# Patient Record
Sex: Male | Born: 1950 | Race: White | Hispanic: No | Marital: Married | Smoking: Never smoker
Health system: Southern US, Community
[De-identification: ages and names within clinical notes are randomized; demographics above are authoritative.]

## PROBLEM LIST (undated history)

## (undated) DIAGNOSIS — F419 Anxiety disorder, unspecified: Secondary | ICD-10-CM

## (undated) DIAGNOSIS — H269 Unspecified cataract: Secondary | ICD-10-CM

## (undated) DIAGNOSIS — R350 Frequency of micturition: Secondary | ICD-10-CM

## (undated) DIAGNOSIS — I10 Essential (primary) hypertension: Secondary | ICD-10-CM

## (undated) DIAGNOSIS — M1990 Unspecified osteoarthritis, unspecified site: Secondary | ICD-10-CM

## (undated) DIAGNOSIS — N400 Enlarged prostate without lower urinary tract symptoms: Secondary | ICD-10-CM

## (undated) DIAGNOSIS — C801 Malignant (primary) neoplasm, unspecified: Secondary | ICD-10-CM

## (undated) DIAGNOSIS — E785 Hyperlipidemia, unspecified: Secondary | ICD-10-CM

## (undated) DIAGNOSIS — F32A Depression, unspecified: Secondary | ICD-10-CM

## (undated) DIAGNOSIS — G4730 Sleep apnea, unspecified: Secondary | ICD-10-CM

## (undated) DIAGNOSIS — R3915 Urgency of urination: Secondary | ICD-10-CM

## (undated) DIAGNOSIS — K219 Gastro-esophageal reflux disease without esophagitis: Secondary | ICD-10-CM

## (undated) DIAGNOSIS — M2550 Pain in unspecified joint: Secondary | ICD-10-CM

## (undated) DIAGNOSIS — F410 Panic disorder [episodic paroxysmal anxiety] without agoraphobia: Secondary | ICD-10-CM

## (undated) DIAGNOSIS — F332 Major depressive disorder, recurrent severe without psychotic features: Secondary | ICD-10-CM

## (undated) DIAGNOSIS — R51 Headache: Secondary | ICD-10-CM

## (undated) DIAGNOSIS — F329 Major depressive disorder, single episode, unspecified: Secondary | ICD-10-CM

## (undated) HISTORY — PX: OTHER SURGICAL HISTORY: SHX169

## (undated) HISTORY — PX: CATARACT EXTRACTION: SUR2

## (undated) HISTORY — PX: EYE SURGERY: SHX253

---

## 2000-03-18 ENCOUNTER — Encounter (INDEPENDENT_AMBULATORY_CARE_PROVIDER_SITE_OTHER): Payer: Self-pay | Admitting: *Deleted

## 2000-03-18 ENCOUNTER — Ambulatory Visit (HOSPITAL_BASED_OUTPATIENT_CLINIC_OR_DEPARTMENT_OTHER): Admission: RE | Admit: 2000-03-18 | Discharge: 2000-03-18 | Payer: Self-pay | Admitting: General Surgery

## 2003-04-13 ENCOUNTER — Emergency Department (HOSPITAL_COMMUNITY): Admission: EM | Admit: 2003-04-13 | Discharge: 2003-04-13 | Payer: Self-pay | Admitting: Emergency Medicine

## 2008-07-03 ENCOUNTER — Encounter: Admission: RE | Admit: 2008-07-03 | Discharge: 2008-07-03 | Payer: Self-pay | Admitting: General Surgery

## 2011-05-08 NOTE — Op Note (Signed)
[place]  Patient:    Brad Miller, Brad Miller                         MRN: [id number] Proc. Date: 03/18/00 Attending:  Gita Kudo, M.D. CC:         Oley Balm. Georgina Pillion, M.D.             [facility]                           Operative Report  OPERATIVE PROCEDURE:  Excision of right breast mass.  SURGEON:  Gita Kudo, M.D.  ANESTHESIA:  MAC -- IV sedation, 1% Xylocaine.  PREOPERATIVE DIAGNOSIS:  Right breast mass, probable gynecomastia.  POSTOPERATIVE DIAGNOSIS:  Right breast mass, probable gynecomastia, pending pathology.  CLINICAL SUMMARY:  [age]-year-old man with a tender nodule in his right breast f about two months duration.  I elected to observe it two to three weeks ago after seeing him in the office.  He came back complaining that it was very tender and he would like to have it removed.  It does not feel malignant.  OPERATIVE FINDINGS:  The patient had rubbery-firm breast tissue without a definite hard mass or specific nodule.  DESCRIPTION OF PROCEDURE:  Under satisfactory intravenous sedation, the patients right chest and breast were prepped and draped in a standard fashion; a curved incision made from the 10 through 1 oclock position of the right breast at the areola, small flaps developed and then the rubbery-firm tissue was removed with a rim of normal breast tissue; this was done using coagulation current for both dissection and hemostasis.  This was carried all the way down to the pectoral muscle.  Then, after the specimen was removed, the wound was made dry by cautery, lavaged with saline and suctioned dry, the breast tissue approximated with 3-0 Vicryl, skin edges with interrupted and running 4-0 nylon, sterile absorbent compressive dressing applied and the patient went to the recovery room from the  operating room in good condition. DD:  03/18/00 TD:  03/18/00 Job: [id number]

## 2014-02-06 ENCOUNTER — Other Ambulatory Visit: Payer: Self-pay | Admitting: Orthopedic Surgery

## 2014-02-08 ENCOUNTER — Encounter (HOSPITAL_COMMUNITY): Payer: Self-pay | Admitting: Pharmacy Technician

## 2014-02-13 ENCOUNTER — Other Ambulatory Visit: Payer: Self-pay | Admitting: Orthopedic Surgery

## 2014-02-15 ENCOUNTER — Inpatient Hospital Stay (HOSPITAL_COMMUNITY)
Admission: RE | Admit: 2014-02-15 | Discharge: 2014-02-15 | Disposition: A | Discharge status: Home / Self Care | Payer: Self-pay | Source: Ambulatory Visit

## 2014-02-15 NOTE — Pre-Procedure Instructions (Signed)
Hiro Vipond  02/15/2014   Your procedure is scheduled on:  Friday, March 6th   Report to [facility]  2 * 3 at 10:45 AM.  Call this number if you have problems the morning of surgery: [phone]   Remember:   Do not eat food or drink liquids after midnight Thursday.   Take these medicines the morning of surgery with A SIP OF WATER: Xanax, Wellbutrin   Do not wear jewelry-no rings or watches.  Do not wear lotions or colognes . You may NOT wear deodorant.   Men may shave face and neck.   Do not bring valuables to the hospital.  [facility] is not responsible for any belongings or valuables.               Contacts, dentures or bridgework may not be worn into surgery.  Leave suitcase in the car. After surgery it may be brought to your room.  For patients admitted to the hospital, discharge time is determined by your treatment team.            Name and phone number of your driver:    Special Instructions:  "Preparing for Surgery"   Please read over the following fact sheets that you were given: Pain Booklet, Coughing and Deep Breathing, Blood Transfusion Information, MRSA Information and Surgical Site Infection Prevention

## 2014-02-20 ENCOUNTER — Encounter (HOSPITAL_COMMUNITY)
Admission: RE | Admit: 2014-02-20 | Discharge: 2014-02-20 | Disposition: A | Discharge status: Home / Self Care | Payer: BC Managed Care – PPO | Source: Ambulatory Visit | Attending: Orthopedic Surgery | Admitting: Orthopedic Surgery

## 2014-02-20 ENCOUNTER — Encounter (HOSPITAL_COMMUNITY): Payer: Self-pay

## 2014-02-20 ENCOUNTER — Ambulatory Visit (HOSPITAL_COMMUNITY)
Admission: RE | Admit: 2014-02-20 | Discharge: 2014-02-20 | Disposition: A | Discharge status: Home / Self Care | Payer: BC Managed Care – PPO | Source: Ambulatory Visit | Attending: Orthopedic Surgery | Admitting: Orthopedic Surgery

## 2014-02-20 DIAGNOSIS — Z01810 Encounter for preprocedural cardiovascular examination: Secondary | ICD-10-CM | POA: Insufficient documentation

## 2014-02-20 DIAGNOSIS — Z01818 Encounter for other preprocedural examination: Secondary | ICD-10-CM | POA: Insufficient documentation

## 2014-02-20 HISTORY — DX: Frequency of micturition: R35.0

## 2014-02-20 HISTORY — DX: Depression, unspecified: F32.A

## 2014-02-20 HISTORY — DX: Headache: R51

## 2014-02-20 HISTORY — DX: Hyperlipidemia, unspecified: E78.5

## 2014-02-20 HISTORY — DX: Unspecified osteoarthritis, unspecified site: M19.90

## 2014-02-20 HISTORY — DX: Major depressive disorder, single episode, unspecified: F32.9

## 2014-02-20 HISTORY — DX: Benign prostatic hyperplasia without lower urinary tract symptoms: N40.0

## 2014-02-20 HISTORY — DX: Pain in unspecified joint: M25.50

## 2014-02-20 HISTORY — DX: Anxiety disorder, unspecified: F41.9

## 2014-02-20 HISTORY — DX: Unspecified cataract: H26.9

## 2014-02-20 HISTORY — DX: Urgency of urination: R39.15

## 2014-02-20 HISTORY — DX: Essential (primary) hypertension: I10

## 2014-02-20 HISTORY — DX: Sleep apnea, unspecified: G47.30

## 2014-02-20 LAB — TYPE AND SCREEN
ABO/RH(D): O NEG
Antibody Screen: NEGATIVE

## 2014-02-20 LAB — PROTIME-INR
INR: 0.99 (ref 0.00–1.49)
PROTHROMBIN TIME: 12.9 s (ref 11.6–15.2)

## 2014-02-20 LAB — CBC WITH DIFFERENTIAL/PLATELET
BASOS PCT: 0 % (ref 0–1)
Basophils Absolute: 0 10*3/uL (ref 0.0–0.1)
EOS ABS: 0.3 10*3/uL (ref 0.0–0.7)
EOS PCT: 4 % (ref 0–5)
HCT: 46.7 % (ref 39.0–52.0)
Hemoglobin: 16.1 g/dL (ref 13.0–17.0)
LYMPHS ABS: 2.3 10*3/uL (ref 0.7–4.0)
Lymphocytes Relative: 28 % (ref 12–46)
MCH: 31 pg (ref 26.0–34.0)
MCHC: 34.5 g/dL (ref 30.0–36.0)
MCV: 89.8 fL (ref 78.0–100.0)
MONOS PCT: 13 % — AB (ref 3–12)
Monocytes Absolute: 1.1 10*3/uL — ABNORMAL HIGH (ref 0.1–1.0)
Neutro Abs: 4.5 10*3/uL (ref 1.7–7.7)
Neutrophils Relative %: 55 % (ref 43–77)
PLATELETS: 280 10*3/uL (ref 150–400)
RBC: 5.2 MIL/uL (ref 4.22–5.81)
RDW: 13.2 % (ref 11.5–15.5)
WBC: 8.1 10*3/uL (ref 4.0–10.5)

## 2014-02-20 LAB — ABO/RH: ABO/RH(D): O NEG

## 2014-02-20 LAB — URINALYSIS, ROUTINE W REFLEX MICROSCOPIC
Bilirubin Urine: NEGATIVE
GLUCOSE, UA: NEGATIVE mg/dL
Hgb urine dipstick: NEGATIVE
KETONES UR: NEGATIVE mg/dL
Leukocytes, UA: NEGATIVE
Nitrite: NEGATIVE
PH: 7 (ref 5.0–8.0)
Protein, ur: NEGATIVE mg/dL
SPECIFIC GRAVITY, URINE: 1.015 (ref 1.005–1.030)
Urobilinogen, UA: 1 mg/dL (ref 0.0–1.0)

## 2014-02-20 LAB — BASIC METABOLIC PANEL
BUN: 13 mg/dL (ref 6–23)
CO2: 27 meq/L (ref 19–32)
CREATININE: 0.91 mg/dL (ref 0.50–1.35)
Calcium: 9.3 mg/dL (ref 8.4–10.5)
Chloride: 102 mEq/L (ref 96–112)
GFR calc Af Amer: >90 mL/min (ref >90)
GFR calc non Af Amer: 88 mL/min — ABNORMAL LOW (ref >90)
GLUCOSE: 87 mg/dL (ref 70–99)
Potassium: 4.1 mEq/L (ref 3.7–5.3)
Sodium: 140 mEq/L (ref 137–147)

## 2014-02-20 LAB — SURGICAL PCR SCREEN
MRSA, PCR: NEGATIVE
Staphylococcus aureus: NEGATIVE

## 2014-02-20 LAB — APTT: aPTT: 31 seconds (ref 24–37)

## 2014-02-20 MED ORDER — CHLORHEXIDINE GLUCONATE 4 % EX LIQD: 60.0000 mL | Freq: Once | CUTANEOUS | Status: DC

## 2014-02-20 NOTE — Progress Notes (Signed)
PT DOESN'T HAVE A CARDIOLOGIST  SLEEP STUDY DONE 4-48YRS AGO-USES CPAP-([facility]ACE ON [address])  DENIES EVER HAVING AN ECHO/STRESS TEST/HEART CATH  JENNIFER WILLARD PA WITH [facility]  DENIES EKG OR CXR IN PAST YR

## 2014-02-20 NOTE — Pre-Procedure Instructions (Signed)
Chrishon Martino  02/20/2014   Your procedure is scheduled on:  Fri, Mar 6 @ 10:30 AM  Report to [facility]ance [facility]  at 8:30 AM.  Call this number if you have problems the morning of surgery: [phone]   Remember:   Do not eat food or drink liquids after midnight.   Take these medicines the morning of surgery with A SIP OF WATER: Alprazolam(Xanax),Wellbutrin(Bupropion),Cardura(Doxazosin),and Paxil(Paroxetine)\              Stop taking your Ibuprofen. No Goody's,BC's,Aleve,Fish Oil,or any Herbal Medications     Do not wear jewelry  Do not wear lotions, powders, or colognes. You may wear deodorant.  Men may shave face and neck.  Do not bring valuables to the hospital.  [facility] is not responsible                  for any belongings or valuables.               Contacts, dentures or bridgework may not be worn into surgery.  Leave suitcase in the car. After surgery it may be brought to your room.  For patients admitted to the hospital, discharge time is determined by your                treatment team.              Special Instructions:  [place] - Preparing for Surgery  Before surgery, you can play an important role.  Because skin is not sterile, your skin needs to be as free of germs as possible.  You can reduce the number of germs on you skin by washing with CHG (chlorahexidine gluconate) soap before surgery.  CHG is an antiseptic cleaner which kills germs and bonds with the skin to continue killing germs even after washing.  Please DO NOT use if you have an allergy to CHG or antibacterial soaps.  If your skin becomes reddened/irritated stop using the CHG and inform your nurse when you arrive at Short Stay.  Do not shave (including legs and underarms) for at least 48 hours prior to the first CHG shower.  You may shave your face.  Please follow these instructions carefully:   1.  Shower with CHG Soap the night before surgery and the                                 morning of Surgery.  2.  If you choose to wash your hair, wash your hair first as usual with your       normal shampoo.  3.  After you shampoo, rinse your hair and body thoroughly to remove the                      Shampoo.  4.  Use CHG as you would any other liquid soap.  You can apply chg directly       to the skin and wash gently with scrungie or a clean washcloth.  5.  Apply the CHG Soap to your body ONLY FROM THE NECK DOWN.        Do not use on open wounds or open sores.  Avoid contact with your eyes,       ears, mouth and genitals (private parts).  Wash genitals (private parts)       with your normal soap.  6.  Wash  thoroughly, paying special attention to the area where your surgery        will be performed.  7.  Thoroughly rinse your body with warm water from the neck down.  8.  DO NOT shower/wash with your normal soap after using and rinsing off       the CHG Soap.  9.  Pat yourself dry with a clean towel.            10.  Wear clean pajamas.            11.  Place clean sheets on your bed the night of your first shower and do not        sleep with pets.  Day of Surgery  Do not apply any lotions/deoderants the morning of surgery.  Please wear clean clothes to the hospital/surgery center.     Please read over the following fact sheets that you were given: Pain Booklet, Coughing and Deep Breathing, Blood Transfusion Information, MRSA Information and Surgical Site Infection Prevention

## 2014-02-22 MED ORDER — DEXTROSE 5 % IV SOLN
3.0000 g | INTRAVENOUS | Status: AC
  Administered 2014-02-23: 3 g via INTRAVENOUS
  Filled 2014-02-22: qty 3000

## 2014-02-22 NOTE — H&P (Signed)
TOTAL KNEE ADMISSION H&P  Patient is being admitted for right total knee arthroplasty.  Subjective:  Chief Complaint:right knee pain.  HPI: Brad Miller, 63 y.o. male, has a history of pain and functional disability in the right knee due to arthritis and has failed non-surgical conservative treatments for greater than 12 weeks to includeNSAID's and/or analgesics, flexibility and strengthening excercises, use of assistive devices and activity modification.  Onset of symptoms was gradual, starting 10 years ago with gradually worsening course since that time. The patient noted no past surgery on the right knee(s).  Patient currently rates pain in the right knee(s) at 10 out of 10 with activity. Patient has worsening of pain with activity and weight bearing, pain that interferes with activities of daily living and pain with passive range of motion.  Patient has evidence of joint space narrowing by imaging studies. There is no active infection.  There are no active problems to display for this patient.  Past Medical History  Diagnosis Date  . Hyperlipidemia     TAKES LOVASTATIN NIGHTLY  . Depression     TAKES WELLBUTRIN DAILY  . Anxiety     TAKES XANAX DAILY AS NEEDED  . Hypertension     TAKES CARDURA DAILY  . Sleep apnea     USES CPAP  . Headache(784.0)     OCCASIONALLY  . Arthritis   . Joint pain   . Urinary frequency   . Urinary urgency   . Enlarged prostate   . Diabetes mellitus without complication     BORDERLINE  . Cataracts, bilateral   . Rash     AROUND NOSE AND STATES D/T NOT USING LOTION WHILE SHAVING    Past Surgical History  Procedure Laterality Date  . Benign tumor removed from chest      No prescriptions prior to admission   No Known Allergies  History  Substance Use Topics  . Smoking status: Never Smoker   . Smokeless tobacco: Not on file  . Alcohol Use: Yes     Comment: OCCASIONALLY    No family history on file.   Review of Systems  Constitutional:  Negative.   HENT: Negative.   Eyes: Negative.   Respiratory: Negative.   Cardiovascular: Negative.   Gastrointestinal: Negative.   Musculoskeletal: Positive for joint pain.  Skin: Negative.   Neurological: Negative.   Psychiatric/Behavioral: Negative.     Objective:  Physical Exam  Constitutional: He is oriented to person, place, and time. He appears well-developed and well-nourished.  HENT:  Head: Normocephalic and atraumatic.  Eyes: Pupils are equal, round, and reactive to light.  Neck: Normal range of motion. Neck supple.  Cardiovascular: Normal rate and regular rhythm.   Respiratory: Effort normal.  Musculoskeletal: He exhibits tenderness.  Neurological: He is alert and oriented to person, place, and time.  Skin: Skin is warm and dry.  Psychiatric: He has a normal mood and affect. His behavior is normal. Judgment and thought content normal.    Vital signs in last 24 hours:    Labs:   There is no height or weight on file to calculate BMI.   Imaging Review Plain x-rays showed bone-on-bone arthritis to the medial aspect of the right knee and good maintenance of cartilage height on the left knee.  Assessment/Plan:  1.  End-stage arthritis of right knee pending replacement March 6 2.  Probable degenerative tearing of the medial and possibly lateral meniscus with chondromalacia of the left knee  We will go ahead and plan  on scoping of the left knee at the same time that were replacing the right knee on March 6.  The patient history, physical examination, clinical judgment of the provider and imaging studies are consistent with end stage degenerative joint disease of the right knee(s) and total knee arthroplasty is deemed medically necessary. The treatment options including medical management, injection therapy arthroscopy and arthroplasty were discussed at length. The risks and benefits of total knee arthroplasty were presented and reviewed. The risks due to aseptic  loosening, infection, stiffness, patella tracking problems, thromboembolic complications and other imponderables were discussed. The patient acknowledged the explanation, agreed to proceed with the plan and consent was signed. Patient is being admitted for inpatient treatment for surgery, pain control, PT, OT, prophylactic antibiotics, VTE prophylaxis, progressive ambulation and ADL's and discharge planning. The patient is planning to be discharged to skilled nursing facility

## 2014-02-23 ENCOUNTER — Encounter (HOSPITAL_COMMUNITY)
Admission: RE | Disposition: A | Discharge status: Home Health | Payer: Self-pay | Source: Ambulatory Visit | Attending: Orthopedic Surgery

## 2014-02-23 ENCOUNTER — Inpatient Hospital Stay (HOSPITAL_COMMUNITY): Payer: BC Managed Care – PPO | Admitting: Certified Registered"

## 2014-02-23 ENCOUNTER — Inpatient Hospital Stay (HOSPITAL_COMMUNITY)
Admission: RE | Admit: 2014-02-23 | Discharge: 2014-02-28 | DRG: 470 | Disposition: A | Discharge status: Home Health | Payer: BC Managed Care – PPO | Source: Ambulatory Visit | Attending: Orthopedic Surgery | Admitting: Orthopedic Surgery

## 2014-02-23 ENCOUNTER — Encounter (HOSPITAL_COMMUNITY): Payer: BC Managed Care – PPO | Admitting: Certified Registered"

## 2014-02-23 ENCOUNTER — Encounter (HOSPITAL_COMMUNITY): Payer: Self-pay | Admitting: *Deleted

## 2014-02-23 DIAGNOSIS — F411 Generalized anxiety disorder: Secondary | ICD-10-CM | POA: Diagnosis present

## 2014-02-23 DIAGNOSIS — F329 Major depressive disorder, single episode, unspecified: Secondary | ICD-10-CM | POA: Diagnosis present

## 2014-02-23 DIAGNOSIS — M1710 Unilateral primary osteoarthritis, unspecified knee: Principal | ICD-10-CM | POA: Diagnosis present

## 2014-02-23 DIAGNOSIS — I10 Essential (primary) hypertension: Secondary | ICD-10-CM | POA: Diagnosis present

## 2014-02-23 DIAGNOSIS — E785 Hyperlipidemia, unspecified: Secondary | ICD-10-CM | POA: Diagnosis present

## 2014-02-23 DIAGNOSIS — Z23 Encounter for immunization: Secondary | ICD-10-CM

## 2014-02-23 DIAGNOSIS — M23329 Other meniscus derangements, posterior horn of medial meniscus, unspecified knee: Secondary | ICD-10-CM | POA: Diagnosis present

## 2014-02-23 DIAGNOSIS — F3289 Other specified depressive episodes: Secondary | ICD-10-CM | POA: Diagnosis present

## 2014-02-23 DIAGNOSIS — M1711 Unilateral primary osteoarthritis, right knee: Secondary | ICD-10-CM | POA: Diagnosis present

## 2014-02-23 DIAGNOSIS — G4730 Sleep apnea, unspecified: Secondary | ICD-10-CM | POA: Diagnosis present

## 2014-02-23 DIAGNOSIS — D62 Acute posthemorrhagic anemia: Secondary | ICD-10-CM | POA: Diagnosis not present

## 2014-02-23 DIAGNOSIS — E119 Type 2 diabetes mellitus without complications: Secondary | ICD-10-CM | POA: Diagnosis present

## 2014-02-23 DIAGNOSIS — M2240 Chondromalacia patellae, unspecified knee: Secondary | ICD-10-CM | POA: Diagnosis present

## 2014-02-23 DIAGNOSIS — D72829 Elevated white blood cell count, unspecified: Secondary | ICD-10-CM | POA: Diagnosis present

## 2014-02-23 DIAGNOSIS — M2340 Loose body in knee, unspecified knee: Secondary | ICD-10-CM | POA: Diagnosis present

## 2014-02-23 HISTORY — PX: KNEE ARTHROSCOPY: SHX127

## 2014-02-23 HISTORY — PX: TOTAL KNEE ARTHROPLASTY: SHX125

## 2014-02-23 LAB — GLUCOSE, CAPILLARY
GLUCOSE-CAPILLARY: 95 mg/dL (ref 70–99)
GLUCOSE-CAPILLARY: 124 mg/dL — AB (ref 70–99)

## 2014-02-23 SURGERY — ARTHROPLASTY, KNEE, TOTAL
Anesthesia: General | Site: Knee | Laterality: Right

## 2014-02-23 MED ORDER — PROPOFOL 10 MG/ML IV BOLUS
INTRAVENOUS | Status: AC
  Filled 2014-02-23: qty 20

## 2014-02-23 MED ORDER — ACETAMINOPHEN 325 MG PO TABS
650.0000 mg | ORAL_TABLET | Freq: Four times a day (QID) | ORAL | Status: DC | PRN
  Administered 2014-02-24 – 2014-02-28 (×8): 650 mg via ORAL
  Filled 2014-02-23 (×8): qty 2

## 2014-02-23 MED ORDER — ONDANSETRON HCL 4 MG PO TABS: 4.0000 mg | ORAL_TABLET | Freq: Four times a day (QID) | ORAL | Status: DC | PRN

## 2014-02-23 MED ORDER — OXYCODONE HCL 5 MG PO TABS
5.0000 mg | ORAL_TABLET | ORAL | Status: DC | PRN
  Administered 2014-02-23 – 2014-02-25 (×11): 10 mg via ORAL
  Filled 2014-02-23 (×11): qty 2

## 2014-02-23 MED ORDER — GLYCOPYRROLATE 0.2 MG/ML IJ SOLN
INTRAMUSCULAR | Status: DC | PRN
  Administered 2014-02-23: 0.4 mg via INTRAVENOUS

## 2014-02-23 MED ORDER — ALPRAZOLAM 0.5 MG PO TABS
0.5000 mg | ORAL_TABLET | Freq: Three times a day (TID) | ORAL | Status: DC | PRN
  Administered 2014-02-24 – 2014-02-27 (×6): 0.5 mg via ORAL
  Filled 2014-02-23 (×6): qty 1

## 2014-02-23 MED ORDER — CEFUROXIME SODIUM 1.5 G IJ SOLR
INTRAMUSCULAR | Status: AC
  Filled 2014-02-23: qty 1.5

## 2014-02-23 MED ORDER — SIMVASTATIN 10 MG PO TABS
10.0000 mg | ORAL_TABLET | Freq: Every day | ORAL | Status: DC
  Administered 2014-02-23 – 2014-02-27 (×5): 10 mg via ORAL
  Filled 2014-02-23 (×6): qty 1

## 2014-02-23 MED ORDER — ONDANSETRON HCL 4 MG/2ML IJ SOLN
INTRAMUSCULAR | Status: AC
  Filled 2014-02-23: qty 2

## 2014-02-23 MED ORDER — SENNOSIDES-DOCUSATE SODIUM 8.6-50 MG PO TABS
1.0000 | ORAL_TABLET | Freq: Every evening | ORAL | Status: DC | PRN
  Administered 2014-02-25 – 2014-02-26 (×2): 1 via ORAL
  Filled 2014-02-23 (×2): qty 1

## 2014-02-23 MED ORDER — TRANEXAMIC ACID 100 MG/ML IV SOLN
1000.0000 mg | INTRAVENOUS | Status: DC
  Administered 2014-02-23: 1000 mg via INTRAVENOUS

## 2014-02-23 MED ORDER — OXYCODONE HCL 5 MG/5ML PO SOLN: 5.0000 mg | Freq: Once | ORAL | Status: AC | PRN

## 2014-02-23 MED ORDER — SUCCINYLCHOLINE CHLORIDE 20 MG/ML IJ SOLN
INTRAMUSCULAR | Status: DC | PRN
  Administered 2014-02-23: 120 mg via INTRAVENOUS

## 2014-02-23 MED ORDER — KCL IN DEXTROSE-NACL 20-5-0.45 MEQ/L-%-% IV SOLN
INTRAVENOUS | Status: DC
  Administered 2014-02-23: 16:00:00 via INTRAVENOUS
  Administered 2014-02-24: 125 mL/h via INTRAVENOUS
  Filled 2014-02-23 (×19): qty 1000

## 2014-02-23 MED ORDER — ALUM & MAG HYDROXIDE-SIMETH 200-200-20 MG/5ML PO SUSP
30.0000 mL | ORAL | Status: DC | PRN
  Administered 2014-02-24: 30 mL via ORAL
  Filled 2014-02-23: qty 30

## 2014-02-23 MED ORDER — METOCLOPRAMIDE HCL 5 MG/ML IJ SOLN
5.0000 mg | Freq: Three times a day (TID) | INTRAMUSCULAR | Status: DC | PRN
  Administered 2014-02-27: 10 mg via INTRAVENOUS
  Filled 2014-02-23: qty 2

## 2014-02-23 MED ORDER — HYDROMORPHONE HCL PF 1 MG/ML IJ SOLN
0.2500 mg | INTRAMUSCULAR | Status: DC | PRN
  Administered 2014-02-23 (×3): 0.5 mg via INTRAVENOUS

## 2014-02-23 MED ORDER — NEOSTIGMINE METHYLSULFATE 1 MG/ML IJ SOLN
INTRAMUSCULAR | Status: DC | PRN
  Administered 2014-02-23: 3 mg via INTRAVENOUS

## 2014-02-23 MED ORDER — MIDAZOLAM HCL 5 MG/5ML IJ SOLN
INTRAMUSCULAR | Status: DC | PRN
  Administered 2014-02-23: 2 mg via INTRAVENOUS

## 2014-02-23 MED ORDER — EPHEDRINE SULFATE 50 MG/ML IJ SOLN
INTRAMUSCULAR | Status: DC | PRN
  Administered 2014-02-23: 10 mg via INTRAVENOUS
  Administered 2014-02-23: 5 mg via INTRAVENOUS

## 2014-02-23 MED ORDER — ACETAMINOPHEN 650 MG RE SUPP: 650.0000 mg | Freq: Four times a day (QID) | RECTAL | Status: DC | PRN

## 2014-02-23 MED ORDER — CEFUROXIME SODIUM 1.5 G IJ SOLR
INTRAMUSCULAR | Status: DC | PRN
  Administered 2014-02-23: 1.5 g

## 2014-02-23 MED ORDER — BUPIVACAINE-EPINEPHRINE (PF) 0.5% -1:200000 IJ SOLN
INTRAMUSCULAR | Status: AC
  Filled 2014-02-23: qty 10

## 2014-02-23 MED ORDER — PHENYLEPHRINE HCL 10 MG/ML IJ SOLN
INTRAMUSCULAR | Status: DC | PRN
  Administered 2014-02-23 (×5): 40 ug via INTRAVENOUS

## 2014-02-23 MED ORDER — MIDAZOLAM HCL 2 MG/2ML IJ SOLN
INTRAMUSCULAR | Status: AC
  Filled 2014-02-23: qty 2

## 2014-02-23 MED ORDER — LACTATED RINGERS IV SOLN
INTRAVENOUS | Status: DC
  Administered 2014-02-23: 09:00:00 via INTRAVENOUS

## 2014-02-23 MED ORDER — GLYCOPYRROLATE 0.2 MG/ML IJ SOLN
INTRAMUSCULAR | Status: AC
  Filled 2014-02-23: qty 2

## 2014-02-23 MED ORDER — HYDROMORPHONE HCL PF 1 MG/ML IJ SOLN
INTRAMUSCULAR | Status: AC
  Administered 2014-02-23: 0.5 mg via INTRAVENOUS
  Filled 2014-02-23: qty 1

## 2014-02-23 MED ORDER — DIPHENHYDRAMINE HCL 12.5 MG/5ML PO ELIX
12.5000 mg | ORAL_SOLUTION | ORAL | Status: DC | PRN
  Filled 2014-02-23: qty 10

## 2014-02-23 MED ORDER — EPINEPHRINE HCL 1 MG/ML IJ SOLN
INTRAMUSCULAR | Status: AC
  Filled 2014-02-23: qty 2

## 2014-02-23 MED ORDER — SODIUM CHLORIDE 0.9 % IR SOLN
Status: DC | PRN
  Administered 2014-02-23: 1000 mL
  Administered 2014-02-23: 3000 mL
  Administered 2014-02-23: 1000 mL
  Administered 2014-02-23: 3000 mL

## 2014-02-23 MED ORDER — BISACODYL 5 MG PO TBEC
5.0000 mg | DELAYED_RELEASE_TABLET | Freq: Every day | ORAL | Status: DC | PRN
  Administered 2014-02-27: 5 mg via ORAL
  Filled 2014-02-23: qty 1

## 2014-02-23 MED ORDER — HYDROMORPHONE HCL PF 1 MG/ML IJ SOLN
1.0000 mg | INTRAMUSCULAR | Status: DC | PRN
  Administered 2014-02-23 – 2014-02-24 (×4): 1 mg via INTRAVENOUS
  Filled 2014-02-23 (×5): qty 1

## 2014-02-23 MED ORDER — OXYCODONE HCL 5 MG PO TABS
ORAL_TABLET | ORAL | Status: AC
  Administered 2014-02-23: 5 mg via ORAL
  Filled 2014-02-23: qty 1

## 2014-02-23 MED ORDER — PHENOL 1.4 % MT LIQD
1.0000 | OROMUCOSAL | Status: DC | PRN
  Filled 2014-02-23: qty 177

## 2014-02-23 MED ORDER — PAROXETINE HCL 30 MG PO TABS
30.0000 mg | ORAL_TABLET | Freq: Every day | ORAL | Status: DC
  Administered 2014-02-23 – 2014-02-28 (×6): 30 mg via ORAL
  Filled 2014-02-23 (×6): qty 1

## 2014-02-23 MED ORDER — ASPIRIN EC 325 MG PO TBEC
325.0000 mg | DELAYED_RELEASE_TABLET | Freq: Every day | ORAL | Status: DC
  Administered 2014-02-24 – 2014-02-28 (×5): 325 mg via ORAL
  Filled 2014-02-23 (×6): qty 1

## 2014-02-23 MED ORDER — METHOCARBAMOL 500 MG PO TABS
500.0000 mg | ORAL_TABLET | Freq: Four times a day (QID) | ORAL | Status: DC | PRN
  Administered 2014-02-23 – 2014-02-26 (×7): 500 mg via ORAL
  Filled 2014-02-23 (×7): qty 1

## 2014-02-23 MED ORDER — EPINEPHRINE HCL 1 MG/ML IJ SOLN
INTRAMUSCULAR | Status: DC | PRN
  Administered 2014-02-23: 1.6 mL

## 2014-02-23 MED ORDER — METOCLOPRAMIDE HCL 10 MG PO TABS
5.0000 mg | ORAL_TABLET | Freq: Three times a day (TID) | ORAL | Status: DC | PRN
  Administered 2014-02-24 – 2014-02-25 (×2): 10 mg via ORAL
  Filled 2014-02-23 (×2): qty 1

## 2014-02-23 MED ORDER — METHOCARBAMOL 500 MG PO TABS
ORAL_TABLET | ORAL | Status: AC
  Administered 2014-02-23: 500 mg via ORAL
  Filled 2014-02-23: qty 1

## 2014-02-23 MED ORDER — LACTATED RINGERS IV SOLN
INTRAVENOUS | Status: DC | PRN
  Administered 2014-02-23 (×2): via INTRAVENOUS

## 2014-02-23 MED ORDER — LIDOCAINE HCL (CARDIAC) 20 MG/ML IV SOLN
INTRAVENOUS | Status: DC | PRN
  Administered 2014-02-23: 60 mg via INTRAVENOUS

## 2014-02-23 MED ORDER — FENTANYL CITRATE 0.05 MG/ML IJ SOLN
INTRAMUSCULAR | Status: DC | PRN
  Administered 2014-02-23 (×4): 50 ug via INTRAVENOUS

## 2014-02-23 MED ORDER — ASPIRIN EC 325 MG PO TBEC: 325.0000 mg | DELAYED_RELEASE_TABLET | Freq: Two times a day (BID) | ORAL | Status: DC

## 2014-02-23 MED ORDER — MAGNESIUM CITRATE PO SOLN: 1.0000 | Freq: Once | ORAL | Status: AC | PRN

## 2014-02-23 MED ORDER — DEXTROSE-NACL 5-0.45 % IV SOLN
INTRAVENOUS | Status: DC
Start: 2014-02-23 — End: 2014-02-23

## 2014-02-23 MED ORDER — ONDANSETRON HCL 4 MG/2ML IJ SOLN
INTRAMUSCULAR | Status: DC | PRN
  Administered 2014-02-23: 4 mg via INTRAVENOUS

## 2014-02-23 MED ORDER — MEPERIDINE HCL 25 MG/ML IJ SOLN: 6.2500 mg | INTRAMUSCULAR | Status: DC | PRN

## 2014-02-23 MED ORDER — DOXAZOSIN MESYLATE 4 MG PO TABS
4.0000 mg | ORAL_TABLET | Freq: Every day | ORAL | Status: DC
  Administered 2014-02-23 – 2014-02-27 (×5): 4 mg via ORAL
  Filled 2014-02-23 (×6): qty 1

## 2014-02-23 MED ORDER — BUPIVACAINE LIPOSOME 1.3 % IJ SUSP
20.0000 mL | Freq: Once | INTRAMUSCULAR | Status: AC
  Administered 2014-02-23: 20 mL
  Filled 2014-02-23: qty 20

## 2014-02-23 MED ORDER — MENTHOL 3 MG MT LOZG
1.0000 | LOZENGE | OROMUCOSAL | Status: DC | PRN
  Filled 2014-02-23: qty 9

## 2014-02-23 MED ORDER — PHENYLEPHRINE HCL 10 MG/ML IJ SOLN
10.0000 mg | INTRAVENOUS | Status: DC | PRN
  Administered 2014-02-23: 10 ug/min via INTRAVENOUS

## 2014-02-23 MED ORDER — INFLUENZA VAC SPLIT QUAD 0.5 ML IM SUSP
0.5000 mL | INTRAMUSCULAR | Status: DC
  Filled 2014-02-23: qty 0.5

## 2014-02-23 MED ORDER — PNEUMOCOCCAL VAC POLYVALENT 25 MCG/0.5ML IJ INJ
0.5000 mL | INJECTION | INTRAMUSCULAR | Status: AC
  Administered 2014-02-26: 0.5 mL via INTRAMUSCULAR
  Filled 2014-02-23: qty 0.5

## 2014-02-23 MED ORDER — DEXAMETHASONE SODIUM PHOSPHATE 10 MG/ML IJ SOLN
INTRAMUSCULAR | Status: DC | PRN
  Administered 2014-02-23: 10 mg via INTRAVENOUS

## 2014-02-23 MED ORDER — BUPROPION HCL ER (SR) 150 MG PO TB12
150.0000 mg | ORAL_TABLET | Freq: Two times a day (BID) | ORAL | Status: DC
  Administered 2014-02-23 – 2014-02-28 (×9): 150 mg via ORAL
  Filled 2014-02-23 (×14): qty 1

## 2014-02-23 MED ORDER — FENTANYL CITRATE 0.05 MG/ML IJ SOLN
INTRAMUSCULAR | Status: AC
  Filled 2014-02-23: qty 5

## 2014-02-23 MED ORDER — METHOCARBAMOL 100 MG/ML IJ SOLN: 500.0000 mg | Freq: Four times a day (QID) | INTRAVENOUS | Status: DC | PRN

## 2014-02-23 MED ORDER — TRANEXAMIC ACID 100 MG/ML IV SOLN
1000.0000 mg | INTRAVENOUS | Status: DC
  Filled 2014-02-23: qty 10

## 2014-02-23 MED ORDER — NEOSTIGMINE METHYLSULFATE 1 MG/ML IJ SOLN
INTRAMUSCULAR | Status: AC
  Filled 2014-02-23: qty 10

## 2014-02-23 MED ORDER — FENTANYL CITRATE 0.05 MG/ML IJ SOLN
INTRAMUSCULAR | Status: AC
  Filled 2014-02-23: qty 2

## 2014-02-23 MED ORDER — ONDANSETRON HCL 4 MG/2ML IJ SOLN: 4.0000 mg | Freq: Once | INTRAMUSCULAR | Status: DC | PRN

## 2014-02-23 MED ORDER — BUPIVACAINE-EPINEPHRINE PF 0.5-1:200000 % IJ SOLN
INTRAMUSCULAR | Status: DC | PRN
  Administered 2014-02-23: 30 mL via PERINEURAL

## 2014-02-23 MED ORDER — DOCUSATE SODIUM 100 MG PO CAPS
100.0000 mg | ORAL_CAPSULE | Freq: Two times a day (BID) | ORAL | Status: DC
  Administered 2014-02-23 – 2014-02-28 (×10): 100 mg via ORAL
  Filled 2014-02-23 (×11): qty 1

## 2014-02-23 MED ORDER — OXYCODONE-ACETAMINOPHEN 5-325 MG PO TABS: 1.0000 | ORAL_TABLET | ORAL | Status: DC | PRN

## 2014-02-23 MED ORDER — ROCURONIUM BROMIDE 100 MG/10ML IV SOLN
INTRAVENOUS | Status: DC | PRN
  Administered 2014-02-23: 35 mg via INTRAVENOUS

## 2014-02-23 MED ORDER — PROPOFOL 10 MG/ML IV BOLUS
INTRAVENOUS | Status: DC | PRN
  Administered 2014-02-23: 120 mg via INTRAVENOUS

## 2014-02-23 MED ORDER — METHOCARBAMOL 500 MG PO TABS: 500.0000 mg | ORAL_TABLET | Freq: Two times a day (BID) | ORAL | Status: DC

## 2014-02-23 MED ORDER — OXYCODONE HCL 5 MG PO TABS
5.0000 mg | ORAL_TABLET | Freq: Once | ORAL | Status: AC | PRN
  Administered 2014-02-23: 5 mg via ORAL

## 2014-02-23 MED ORDER — ONDANSETRON HCL 4 MG/2ML IJ SOLN
4.0000 mg | Freq: Four times a day (QID) | INTRAMUSCULAR | Status: DC | PRN
  Administered 2014-02-24: 4 mg via INTRAVENOUS
  Filled 2014-02-23: qty 2

## 2014-02-23 SURGICAL SUPPLY — 82 items
BANDAGE ELASTIC 6 VELCRO ST LF (GAUZE/BANDAGES/DRESSINGS) IMPLANT
BANDAGE ESMARK 6X9 LF (GAUZE/BANDAGES/DRESSINGS) ×2 IMPLANT
BLADE CUDA 5.5 (BLADE) IMPLANT
BLADE CUTTER GATOR 3.5 (BLADE) ×4 IMPLANT
BLADE GREAT WHITE 4.2 (BLADE) IMPLANT
BLADE GREAT WHITE 4.2MM (BLADE)
BLADE SAG 18X100X1.27 (BLADE) ×4 IMPLANT
BLADE SAW SGTL 13X75X1.27 (BLADE) ×4 IMPLANT
BLADE SURG ROTATE 9660 (MISCELLANEOUS) IMPLANT
BNDG COHESIVE 6X5 TAN STRL LF (GAUZE/BANDAGES/DRESSINGS) ×4 IMPLANT
BNDG ELASTIC 6X10 VLCR STRL LF (GAUZE/BANDAGES/DRESSINGS) ×8 IMPLANT
BNDG ESMARK 6X9 LF (GAUZE/BANDAGES/DRESSINGS) ×4
BOWL SMART MIX CTS (DISPOSABLE) ×4 IMPLANT
BUR OVAL 6.0 (BURR) IMPLANT
CAPT RP KNEE ×4 IMPLANT
CEMENT HV SMART SET (Cement) ×8 IMPLANT
COVER SURGICAL LIGHT HANDLE (MISCELLANEOUS) ×4 IMPLANT
CUFF TOURNIQUET SINGLE 34IN LL (TOURNIQUET CUFF) IMPLANT
CUFF TOURNIQUET SINGLE 44IN (TOURNIQUET CUFF) ×4 IMPLANT
DRAPE ARTHROSCOPY W/POUCH 114 (DRAPES) IMPLANT
DRAPE EXTREMITY BILATERAL (DRAPE) ×4 IMPLANT
DRAPE EXTREMITY T 121X128X90 (DRAPE) ×4 IMPLANT
DRAPE U-SHAPE 47X51 STRL (DRAPES) ×4 IMPLANT
DRSG PAD ABDOMINAL 8X10 ST (GAUZE/BANDAGES/DRESSINGS) ×4 IMPLANT
DURAPREP 26ML APPLICATOR (WOUND CARE) ×8 IMPLANT
ELECT REM PT RETURN 9FT ADLT (ELECTROSURGICAL) ×4
ELECTRODE REM PT RTRN 9FT ADLT (ELECTROSURGICAL) ×2 IMPLANT
EVACUATOR 1/8 PVC DRAIN (DRAIN) ×4 IMPLANT
FILTER STRAW FLUID ASPIR (MISCELLANEOUS) ×4 IMPLANT
GAUZE XEROFORM 1X8 LF (GAUZE/BANDAGES/DRESSINGS) ×8 IMPLANT
GLOVE BIO SURGEON STRL SZ 6.5 (GLOVE) ×3 IMPLANT
GLOVE BIO SURGEON STRL SZ7.5 (GLOVE) ×8 IMPLANT
GLOVE BIO SURGEON STRL SZ8.5 (GLOVE) ×8 IMPLANT
GLOVE BIO SURGEONS STRL SZ 6.5 (GLOVE) ×1
GLOVE BIOGEL PI IND STRL 6.5 (GLOVE) ×2 IMPLANT
GLOVE BIOGEL PI IND STRL 7.0 (GLOVE) ×2 IMPLANT
GLOVE BIOGEL PI IND STRL 7.5 (GLOVE) ×4 IMPLANT
GLOVE BIOGEL PI IND STRL 8 (GLOVE) ×2 IMPLANT
GLOVE BIOGEL PI IND STRL 9 (GLOVE) ×2 IMPLANT
GLOVE BIOGEL PI INDICATOR 6.5 (GLOVE) ×2
GLOVE BIOGEL PI INDICATOR 7.0 (GLOVE) ×2
GLOVE BIOGEL PI INDICATOR 7.5 (GLOVE) ×4
GLOVE BIOGEL PI INDICATOR 8 (GLOVE) ×2
GLOVE BIOGEL PI INDICATOR 9 (GLOVE) ×2
GLOVE ECLIPSE 7.5 STRL STRAW (GLOVE) ×4 IMPLANT
GLOVE SURG SS PI 8.5 STRL IVOR (GLOVE) ×2
GLOVE SURG SS PI 8.5 STRL STRW (GLOVE) ×2 IMPLANT
GOWN PREVENTION PLUS XLARGE (GOWN DISPOSABLE) ×4 IMPLANT
GOWN STRL NON-REIN LRG LVL3 (GOWN DISPOSABLE) ×12 IMPLANT
GOWN STRL REIN XL XLG (GOWN DISPOSABLE) ×12 IMPLANT
HANDPIECE INTERPULSE COAX TIP (DISPOSABLE) ×2
HOOD PEEL AWAY FACE SHEILD DIS (HOOD) ×12 IMPLANT
KIT BASIN OR (CUSTOM PROCEDURE TRAY) ×4 IMPLANT
KIT ROOM TURNOVER OR (KITS) ×4 IMPLANT
MANIFOLD NEPTUNE II (INSTRUMENTS) ×4 IMPLANT
NDL SAFETY ECLIPSE 18X1.5 (NEEDLE) ×2 IMPLANT
NEEDLE 18GX1X1/2 (RX/OR ONLY) (NEEDLE) ×4 IMPLANT
NEEDLE HYPO 18GX1.5 SHARP (NEEDLE) ×2
NEEDLE SPNL 18GX3.5 QUINCKE PK (NEEDLE) ×4 IMPLANT
NS IRRIG 1000ML POUR BTL (IV SOLUTION) ×4 IMPLANT
PACK ARTHROSCOPY DSU (CUSTOM PROCEDURE TRAY) ×4 IMPLANT
PACK TOTAL JOINT (CUSTOM PROCEDURE TRAY) ×4 IMPLANT
PAD ARMBOARD 7.5X6 YLW CONV (MISCELLANEOUS) ×8 IMPLANT
PADDING CAST COTTON 6X4 STRL (CAST SUPPLIES) ×8 IMPLANT
SET ARTHROSCOPY TUBING (MISCELLANEOUS) ×2
SET ARTHROSCOPY TUBING LN (MISCELLANEOUS) ×2 IMPLANT
SET HNDPC FAN SPRY TIP SCT (DISPOSABLE) ×2 IMPLANT
SPONGE GAUZE 4X4 12PLY (GAUZE/BANDAGES/DRESSINGS) ×8 IMPLANT
SPONGE LAP 4X18 X RAY DECT (DISPOSABLE) IMPLANT
STAPLER VISISTAT 35W (STAPLE) ×4 IMPLANT
SUCTION FRAZIER TIP 10 FR DISP (SUCTIONS) IMPLANT
SUT VIC AB 0 CTX 36 (SUTURE) ×2
SUT VIC AB 0 CTX36XBRD ANTBCTR (SUTURE) ×2 IMPLANT
SUT VIC AB 1 CTX 36 (SUTURE) ×2
SUT VIC AB 1 CTX36XBRD ANBCTR (SUTURE) ×2 IMPLANT
SUT VIC AB 2-0 CT1 27 (SUTURE) ×2
SUT VIC AB 2-0 CT1 TAPERPNT 27 (SUTURE) ×2 IMPLANT
SYR 3ML LL SCALE MARK (SYRINGE) ×4 IMPLANT
SYR 50ML LL SCALE MARK (SYRINGE) ×4 IMPLANT
TOWEL OR 17X24 6PK STRL BLUE (TOWEL DISPOSABLE) ×8 IMPLANT
TOWEL OR 17X26 10 PK STRL BLUE (TOWEL DISPOSABLE) ×4 IMPLANT
WATER STERILE IRR 1000ML POUR (IV SOLUTION) ×4 IMPLANT

## 2014-02-23 NOTE — Op Note (Signed)
Pre-Op Dx: Left knee medial meniscal tear with chondromalacia, and stage arthritis of right knee with varus deformity  Postop Dx: Left knee posterior horn. 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 Right total knee arthroplasty using DePuy Sigma RP components for right femur 5 tibia 10 mm Sigma RP bearing 41 mm patellar button-DePuy HV cement with 1500 mg of Zinacef.  Surgeon: Kathalene Frames. Mayer Camel M.D.  Assist: Kerry Hough. Barton Dubois  (present throughout entire procedure and necessary for timely completion of the procedure) Anes: General LMA  EBL: Minimal  Fluids: 1800 cc   Tourniquet time to the right knee was 75 minutes  Indications: Patient has catching popping and pain in his left knee and is a retainer along the medial joint line clinically has a medial meniscal tear that is symptomatic he's had intermittent effusions.. Pt has failed conservative treatment with anti-inflammatory medicines, physical therapy, and modified activites but did get good temporarily from an intra-articular cortisone injection. Pain has recurred and patient desires elective arthroscopic evaluation and treatment of knee. Risks and benefits of surgery have been discussed and questions answered.  Procedure: Patient identified by arm band and taken to the operating room at [facility]. The appropriate anesthetic monitors were attached, and General LMA anesthesia was induced without difficulty. Lateral post was applied to the table and both lower extremity was prepped and draped in usual sterile fashion from the foot to the midthigh, and a tourniquet placed on the right lower extremity high on the thigh.. Time out procedure was performed. We began the operation by making  standard inferior lateral and inferior medial peripatellar portals with a #11 blade allowing introduction of the arthroscope through the inferior lateral portal and the out flow to the inferior medial portal. Pump pressure was set at 100 mmHg and diagnostic arthroscopy  revealed grade 3 chondromalacia flap tears of the trochlea this is debrider back to a stable margin with a 3.5 mm Gator sucker shaver and straight biter, posterior horn medial meniscal tear large parrot-beak that was treated back to a stable margin with a 3.5 Gator sucker shaver and a large straight biter. 1 cm osteochondral loose body in the intercondylar notch removed with a pituitary Roger. The gutters were cleared medially and laterally the lateral meniscus was intact with no tearing.. The knee was irrigated out normal saline solution. A dressing of xerofoam 4 x 4 dressing sponges, web roll and an Ace wrap was applied. The patient was awakened extubated and taken to the recovery without difficult   After completing the left knee arthroscopy we proceeded a right total knee arthroplasty. The right limb was wrapped with an Esmarch bandage and the tourniquet inflated to 350 mmHg. We began the operation by making the anterior midline incision starting at handbreadth above the patella going over the patella 1 cm medial to and  4 cm distal to the tibial tubercle. Small bleeders in the skin and the  subcutaneous tissue identified and cauterized. Transverse retinaculum was incised and reflected medially and a medial parapatellar arthrotomy was accomplished. the patella was everted and theprepatellar fat pad resected. The superficial medial collateral  ligament was then elevated from anterior to posterior along the proximal  flare of the tibia and anterior half of the menisci resected. The knee was hyperflexed  exposing bone on bone arthritis. Peripheral and notch osteophytes as well as the cruciate ligaments were then resected. We continued to   work our way around posteriorly along the proximal tibia, and externally  rotated the tibia subluxing it out from underneath the femur. A McHale  retractor was placed through the notch and a lateral Hohmann retractor  placed, and we then drilled through the proximal tibia in line with the  axis of the tibia followed by an intramedullary guide rod and 2-degree  posterior slope cutting guide. The tibial cutting guide was pinned into place  allowing resection of 7 mm of bone medially and about 12 mm of bone  laterally because of her varus deformity. Satisfied with the tibial resection, we then  entered the distal femur 2 mm anterior to the PCL origin with the  intramedullary guide rod and applied the distal femoral cutting guide  set at 21mm, with 5 degrees of valgus. This was pinned along the  epicondylar axis. At this point, the distal femoral cut was accomplished without difficulty. We then sized for a #4R femoral component and pinned the guide in 3 degrees of external rotation.The chamfer cutting guide was pinned into place. The anterior, posterior, and chamfer cuts were accomplished without difficulty followed by  the Sigma RP box cutting guide and the box cut. We also removed posterior osteophytes from the posterior femoral condyles. At this  time, the knee was brought into full extension. We checked our  extension and flexion gaps and found them symmetric at 55mm.  The patella thickness measured at 26 mm. We set the cutting guide at 16 and removed the posterior 10 mm  of the patella, sized for a 41 button and drilled the lollipop. The knee  was then once again hyperflexed exposing the proximal tibia. We sized for a #5 tibial base plate, applied the smokestack and the conical reamer followed by the the Delta fin keel punch. We then hammered into place the Sigma RP trial femoral component, inserted a 10-mm trial bearing, trial patellar button, and took the knee through range of motion from 0-130  degrees. No thumb pressure was required for patellar  tracking. At this point, all trial components were removed, a double batch of DePuy HV cement with 1500 mg of Zinacef was mixed and applied to all bony metallic mating surfaces except for the posterior condyles of the femur itself. In order, we  hammered into place the tibial tray and removed excess cement, the femoral component and removed excess cement, a 10-mm Sigma RP bearing  was inserted, and the knee brought to full extension with compression.  The patellar button was clamped into place, and excess cement  removed. While the cement cured the wound was irrigated out with normal saline solution pulse lavage, and medium Hemovac drains were placed from an anterolateral  approach. Ligament stability and patellar tracking were checked and found to be excellent. The parapatellar arthrotomy was closed with  running #1 Vicryl suture. The subcutaneous tissue with 0 and 2-0 undyed  Vicryl suture, and the skin with skin staples. A dressing of Xeroform,  4 x 4, dressing sponges, Webril, and Ace wrap applied. The patient  awakened, extubated, and taken to recovery room without difficulty.   Kerin Salen 02/23/2014, 12:23 PM    Signed: Kerin Salen, MD

## 2014-02-23 NOTE — Progress Notes (Signed)
Orthopedic Tech Progress Note Patient Details:  Brad Miller 1951-03-26 [id number] CPM applied to Right LE with appropriate settings. OHF applied to bed. CPM Right Knee CPM Right Knee: On Right Knee Flexion (Degrees): 60 Right Knee Extension (Degrees): 0   [name] 02/23/2014, 2:47 PM

## 2014-02-23 NOTE — Anesthesia Preprocedure Evaluation (Addendum)
Anesthesia Evaluation  Patient identified by MRN, date of birth, ID band Patient awake    Reviewed: Allergy & Precautions, H&P , NPO status , Patient's Chart, lab work & pertinent test results, reviewed documented beta blocker date and time   Airway Mallampati: II TM Distance: >3 FB Neck ROM: Full    Dental  (+) Teeth Intact, Dental Advisory Given   Pulmonary sleep apnea and Continuous Positive Airway Pressure Ventilation ,  breath sounds clear to auscultation        Cardiovascular hypertension, Pt. on medications Rhythm:Regular Rate:Normal     Neuro/Psych  Headaches, Anxiety Depression    GI/Hepatic negative GI ROS, Neg liver ROS,   Endo/Other  negative endocrine ROSdiabetes, Well Controlled, Type 2, Oral Hypoglycemic Agents  Renal/GU negative Renal ROS     Musculoskeletal   Abdominal (+)  Abdomen: soft. Bowel sounds: normal.  Peds  Hematology negative hematology ROS (+)   Anesthesia Other Findings   Reproductive/Obstetrics                       Anesthesia Physical Anesthesia Plan  ASA: III  Anesthesia Plan: General   Post-op Pain Management:    Induction: Intravenous  Airway Management Planned: Oral ETT  Additional Equipment:   Intra-op Plan:   Post-operative Plan: Extubation in OR  Informed Consent: I have reviewed the patients History and Physical, chart, labs and discussed the procedure including the risks, benefits and alternatives for the proposed anesthesia with the patient or authorized representative who has indicated his/her understanding and acceptance.     Plan Discussed with: CRNA and Surgeon  Anesthesia Plan Comments:         Anesthesia Quick Evaluation

## 2014-02-23 NOTE — Plan of Care (Signed)
Problem: Consults Goal: Diagnosis- Total Joint Replacement Primary Total Knee Right     

## 2014-02-23 NOTE — Progress Notes (Signed)
Utilization review completed.  

## 2014-02-23 NOTE — Interval H&P Note (Signed)
History and Physical Interval Note:  02/23/2014 10:15 AM  Brad Miller  has presented today for surgery, with the diagnosis of RIGHT KNEE ARTHROPLASTY, LEFT MEDIAL MENISCAL TEAR AND CHRONDROMALACIA  The various methods of treatment have been discussed with the patient and family. After consideration of risks, benefits and other options for treatment, the patient has consented to  Procedure(s): TOTAL KNEE ARTHROPLASTY (Right) LEFT ARTHROSCOPY KNEE (Left) as a surgical intervention .  The patient's history has been reviewed, patient examined, no change in status, stable for surgery.  I have reviewed the patient's chart and labs.  Questions were answered to the patient's satisfaction.     Kerin Salen

## 2014-02-23 NOTE — Transfer of Care (Signed)
Immediate Anesthesia Transfer of Care Note  Patient: Brad Miller  Procedure(s) Performed: Procedure(s): TOTAL KNEE ARTHROPLASTY (Right) LEFT ARTHROSCOPY KNEE (Left)  Patient Location: PACU  Anesthesia Type:General  Level of Consciousness: awake, alert  and sedated  Airway & Oxygen Therapy: Patient connected to face mask oxygen  Post-op Assessment: Report given to PACU RN  Post vital signs: stable  Complications: No apparent anesthesia complications

## 2014-02-23 NOTE — Preoperative (Signed)
Beta Blockers   Reason not to administer Beta Blockers:Not Applicable 

## 2014-02-23 NOTE — Progress Notes (Signed)
Patient uses CPAP at home.  Patient brought his mask from home but was unsure of his settings.  RT set patient up on Auto mode.  Patient tolerating well at this time.  RT will continue to monitor.

## 2014-02-23 NOTE — Anesthesia Procedure Notes (Addendum)
Anesthesia Regional Block:  Adductor canal block  Pre-Anesthetic Checklist: ,, timeout performed, Correct Patient, Correct Site, Correct Laterality, Correct Procedure, Correct Position, site marked, Risks and benefits discussed,  Surgical consent,  Pre-op evaluation,  At surgeon's request and post-op pain management  Laterality: Right  Prep: chloraprep       Needles:  Injection technique: Single-shot  Needle Type: Echogenic Stimulator Needle     Needle Length: 9cm 9 cm Needle Gauge: 21 and 21 G    Additional Needles:  Procedures: ultrasound guided (picture in chart) Adductor canal block Narrative:  Start time: 02/23/2014 10:15 AM End time: 02/23/2014 10:25 AM Injection made incrementally with aspirations every 5 mL.  Performed by: Personally  Anesthesiologist: Lillia Abed MD  Additional Notes: Monitors applied. Patient sedated. Sterile prep and drape,hand hygiene and sterile gloves were used. Relevant anatomy identified.Needle position confirmed.Local anesthetic injected incrementally after negative aspiration. Local anesthetic spread visualized around nerve(s). Vascular puncture avoided. No complications. Image printed for medical record.The patient tolerated the procedure well.    Lillia Abed MD   Procedure Name: Intubation Date/Time: 02/23/2014 10:44 AM Performed by: Maeola Harman Pre-anesthesia Checklist: Patient identified, Emergency Drugs available, Suction available and Patient being monitored Patient Re-evaluated:Patient Re-evaluated prior to inductionOxygen Delivery Method: Circle system utilized Preoxygenation: Pre-oxygenation with 100% oxygen Intubation Type: IV induction, Rapid sequence and Cricoid Pressure applied Laryngoscope Size: Mac and 4 Tube size: 7.5 mm Number of attempts: 1 Airway Equipment and Method: Stylet Placement Confirmation: ETT inserted through vocal cords under direct vision,  breath sounds checked- equal and bilateral and positive  ETCO2 Secured at: 22 cm Tube secured with: Tape Dental Injury: Teeth and Oropharynx as per pre-operative assessment  Comments: Pt drank approx 4 oz of black coffee and 2-4 oz of water this am at 0700.  Planned RSI discussed with pt.  Easy induction and intubation with cricoid pressure and MAC 4 blade.  Dr. Conrad [place] verified placement of ETT.  OGT passed and stomach decompressed after intubation.  Waldron Session, CRNA

## 2014-02-24 LAB — CBC
HCT: 42.5 % (ref 39.0–52.0)
Hemoglobin: 14.5 g/dL (ref 13.0–17.0)
MCH: 30.9 pg (ref 26.0–34.0)
MCHC: 34.1 g/dL (ref 30.0–36.0)
MCV: 90.4 fL (ref 78.0–100.0)
Platelets: 253 10*3/uL (ref 150–400)
RBC: 4.7 MIL/uL (ref 4.22–5.81)
RDW: 13.4 % (ref 11.5–15.5)
WBC: 15.4 10*3/uL — ABNORMAL HIGH (ref 4.0–10.5)

## 2014-02-24 MED ORDER — CHLORPROMAZINE HCL 25 MG PO TABS
25.0000 mg | ORAL_TABLET | Freq: Four times a day (QID) | ORAL | Status: DC | PRN
  Administered 2014-02-25: 25 mg via ORAL
  Filled 2014-02-24 (×2): qty 1

## 2014-02-24 NOTE — Progress Notes (Addendum)
Pt has had hiccups for over 24 hours. States he would like some relief. Is having left flank pain from frequent hiccups. PA Laliberte notified and thorazine prn ordered. Pt was repositioned and was given pain meds and hiccups ceased without using Thorazine. Pt resting comfortably.   Pt awakened at 0430 with hiccups. Thorazine given with pain medication. Pt experienced relief and was able to rest.

## 2014-02-24 NOTE — Progress Notes (Signed)
Physical Therapy Treatment Patient Details Name: Brad Miller MRN: [id number] DOB: 12-04-1951 Today's Date: 02/24/2014 Time: 7510-2585 PT Time Calculation (min): 18 min  PT Assessment / Plan / Recommendation  History of Present Illness Pt underwent R TKA and L knee scope 02-23-14.   PT Comments   Pt reports ambulating to/from BR with nursing assist and RW.  Follow Up Recommendations  Home health PT;Supervision/Assistance - 24 hour     Does the patient have the potential to tolerate intense rehabilitation     Barriers to Discharge        Equipment Recommendations  Rolling walker with 5" wheels    Recommendations for Other Services    Frequency 7X/week   Progress towards PT Goals Progress towards PT goals: Progressing toward goals  Plan Current plan remains appropriate    Precautions / Restrictions Precautions Precautions: Knee Restrictions RLE Weight Bearing: Weight bearing as tolerated LLE Weight Bearing: Weight bearing as tolerated   Pertinent Vitals/Pain 8/10    Mobility  Bed Mobility Overal bed mobility: Needs Assistance Bed Mobility: Supine to Sit Supine to sit: HOB elevated;Mod assist General bed mobility comments: verbal cues for sequencing Transfers Overall transfer level: Needs assistance Equipment used: Rolling walker (2 wheeled) Transfers: Sit to/from Stand General transfer comment: verbal cues for hand placement Ambulation/Gait Ambulation/Gait assistance: Min assist Ambulation Distance (Feet): 5 Feet Assistive device: Rolling walker (2 wheeled) Gait Pattern/deviations: Step-to pattern;Antalgic;Decreased stride length Gait velocity interpretation: Below normal speed for age/gender General Gait Details: retropulsive    Exercises Total Joint Exercises Ankle Circles/Pumps: AROM;Both;10 reps Quad Sets: AROM;Right;10 reps Heel Slides: AAROM;Right;10 reps Hip ABduction/ADduction: AAROM;Right;10 reps Goniometric ROM: 0-45 AAROM R knee in supine   PT  Diagnosis: Difficulty walking;Acute pain  PT Problem List: Decreased strength;Decreased range of motion;Decreased activity tolerance;Decreased balance;Decreased mobility;Decreased knowledge of use of DME;Pain PT Treatment Interventions: DME instruction;Gait training;Stair training;Functional mobility training;Therapeutic activities;Therapeutic exercise;Patient/family education;Balance training   PT Goals (current goals can now be found in the care plan section) Acute Rehab PT Goals Patient Stated Goal: home PT Goal Formulation: With patient Time For Goal Achievement: 03/03/14 Potential to Achieve Goals: Good  Visit Information  Last PT Received On: 02/24/14 Assistance Needed: +2 (safety for ambulation) History of Present Illness: Pt underwent R TKA and L knee scope 02-23-14.    Subjective Data  Patient Stated Goal: home   Cognition  Cognition Arousal/Alertness: Awake/alert Behavior During Therapy: WFL for tasks assessed/performed Overall Cognitive Status: Within Functional Limits for tasks assessed    Balance     End of Session PT - End of Session Equipment Utilized During Treatment: Gait belt Activity Tolerance: Patient tolerated treatment well Patient left: in bed;in CPM;with call bell/phone within reach;with family/visitor present Nurse Communication: Mobility status CPM Right Knee CPM Right Knee: On Right Knee Flexion (Degrees): 60 Right Knee Extension (Degrees): 0   GP     Lorriane Shire 02/24/2014, 2:27 PM  Lorrin Goodell, PT  Office # [phone] Pager [phone]

## 2014-02-24 NOTE — Progress Notes (Signed)
Patient ID: Brad Miller, male   DOB: 1951-12-21, 63 y.o.   MRN: [id number] PATIENT ID: Brad Miller  MRN: [id number]  DOB/AGE:  Aug 23, 1951 / 63 y.o.  1 Day Post-Op Procedure(s) (LRB): TOTAL KNEE ARTHROPLASTY (Right) LEFT ARTHROSCOPY KNEE (Left)    PROGRESS NOTE Subjective: Patient is alert, oriented, no Nausea, no Vomiting, min passing gas, no Bowel Movement. Taking PO sips. Denies SOB, Chest or Calf Pain. Using Incentive Spirometer, PAS in place. Ambulate weight bearing as tolerated to both lower extremities, CPM 0-60 Patient reports pain as 4 on 0-10 scale  .    Objective: Vital signs in last 24 hours: Filed Vitals:   02/24/14 0000 02/24/14 0100 02/24/14 0400 02/24/14 0553  BP:  119/68  132/48  Pulse:  112  104  Temp:  97.5 F (36.4 C)  97.8 F (36.6 C)  TempSrc:  Axillary  Axillary  Resp: 16 14 18 18   SpO2: 92% 95% 92% 96%      Intake/Output from previous day: I/O last 3 completed shifts: In: 2289.6 [I.V.:2289.6] Out: 525 [Urine:500; Drains:25]   Intake/Output this shift:     LABORATORY DATA:  Recent Labs  02/23/14 0857 02/23/14 1306 02/24/14 0446  WBC  --   --  15.4*  HGB  --   --  14.5  HCT  --   --  42.5  PLT  --   --  253  GLUCAP 95 124*  --     Examination: Neurologically intact ABD soft Neurovascular intact Sensation intact distally Intact pulses distally Dorsiflexion/Plantar flexion intact Incision: scant drainage No cellulitis present Compartment soft} Blood and plasma separated in drain indicating minimal recent drainage, drain pulled without difficulty.  Assessment:   1 Day Post-Op Procedure(s) (LRB): TOTAL KNEE ARTHROPLASTY (Right) LEFT ARTHROSCOPY KNEE (Left) ADDITIONAL DIAGNOSIS:  Hypertension and Sleep Apnea  Plan: PT/OT WBAT, CPM 5/hrs day until ROM 0-90 degrees, then D/C CPM DVT Prophylaxis:  SCDx72hrs, ASA 325 mg BID x 2 weeks DISCHARGE PLAN: Home, patient may require SNIF placement depending out he does with physical therapy.  He is okay with rehabilitation if needed DISCHARGE NEEDS: HHPT, HHRN, CPM, Walker and 3-in-1 comode seat     [name] 02/24/2014, 7:05 AM

## 2014-02-24 NOTE — Progress Notes (Signed)
Patient already on CPAP with 4L bled in.  Tolerating well.  RT will continue to monitor.

## 2014-02-24 NOTE — Evaluation (Signed)
Physical Therapy Evaluation Patient Details Name: Brad Miller MRN: [id number] DOB: [birth year] Today's Date: 02/24/2014 Time: 6010-9323 PT Time Calculation (min): 22 min  PT Assessment / Plan / Recommendation History of Present Illness  Pt underwent R TKA and L knee scope 02-23-14.  Clinical Impression  Pt is s/p TKA resulting in the deficits listed below (see PT Problem List). Pt also underwent L knee scope.  He is WBAT BLE. Pt will benefit from skilled PT to increase their independence and safety with mobility to allow discharge to the venue listed below. Pt's spouse will be able to provide 24 hour supervision/assist.  Pt's bedroom is upstairs, but he has arranged to have a bed downstairs.     PT Assessment  Patient needs continued PT services    Follow Up Recommendations  Home health PT;Supervision/Assistance - 24 hour    Does the patient have the potential to tolerate intense rehabilitation      Barriers to Discharge        Equipment Recommendations  Rolling walker with 5" wheels    Recommendations for Other Services     Frequency 7X/week    Precautions / Restrictions Precautions Precautions: Knee Restrictions Weight Bearing Restrictions: Yes RLE Weight Bearing: Weight bearing as tolerated LLE Weight Bearing: Weight bearing as tolerated   Pertinent Vitals/Pain 7/10      Mobility  Bed Mobility Overal bed mobility: Needs Assistance Bed Mobility: Supine to Sit Supine to sit: HOB elevated;Mod assist General bed mobility comments: verbal cues for sequencing Transfers Overall transfer level: Needs assistance Equipment used: Rolling walker (2 wheeled) Transfers: Sit to/from Stand General transfer comment: verbal cues for hand placement Ambulation/Gait Ambulation/Gait assistance: Min assist Ambulation Distance (Feet): 5 Feet Assistive device: Rolling walker (2 wheeled) Gait Pattern/deviations: Step-to pattern;Antalgic;Decreased stride length Gait velocity  interpretation: Below normal speed for age/gender General Gait Details: retropulsive    Exercises     PT Diagnosis: Difficulty walking;Acute pain  PT Problem List: Decreased strength;Decreased range of motion;Decreased activity tolerance;Decreased balance;Decreased mobility;Decreased knowledge of use of DME;Pain PT Treatment Interventions: DME instruction;Gait training;Stair training;Functional mobility training;Therapeutic activities;Therapeutic exercise;Patient/family education;Balance training     PT Goals(Current goals can be found in the care plan section) Acute Rehab PT Goals Patient Stated Goal: home PT Goal Formulation: With patient Time For Goal Achievement: 03/03/14 Potential to Achieve Goals: Good  Visit Information  Last PT Received On: 02/24/14 Assistance Needed: +2 (safety for ambulation) History of Present Illness: Pt underwent R TKA and L knee scope 02-23-14.       Prior [place] expects to be discharged to:: Private residence Living Arrangements: Spouse/significant other Available Help at Discharge: Family;Available 24 hours/day Type of Home: Apartment Home Access: Stairs to enter Entrance Stairs-Number of Steps: 2 Entrance Stairs-Rails: None Home Layout: Two level;1/2 bath on main level (bed available on main level) Alternate Level Stairs-Number of Steps: 12 Alternate Level Stairs-Rails: Right Prior Function Level of Independence: Independent Communication Communication: No difficulties    Cognition  Cognition Arousal/Alertness: Awake/alert Behavior During Therapy: WFL for tasks assessed/performed Overall Cognitive Status: Within Functional Limits for tasks assessed    Extremity/Trunk Assessment     Balance    End of Session PT - End of Session Equipment Utilized During Treatment: Gait belt Activity Tolerance: Patient tolerated treatment well Patient left: in chair;with call bell/phone within reach Nurse  Communication: Mobility status CPM Right Knee CPM Right Knee: Off  GP     Lorriane Shire 02/24/2014, 11:21 AM  Lorrin Goodell,  PT  Office # [phone] Pager [phone]

## 2014-02-24 NOTE — Anesthesia Postprocedure Evaluation (Signed)
Anesthesia Post Note  Patient: Brad Miller  Procedure(s) Performed: Procedure(s) (LRB): TOTAL KNEE ARTHROPLASTY (Right) LEFT ARTHROSCOPY KNEE (Left)  Anesthesia type: general  Patient location: PACU  Post pain: Pain level controlled  Post assessment: Patient's Cardiovascular Status Stable  Last Vitals:  Filed Vitals:   02/24/14 0553  BP: 132/48  Pulse: 104  Temp: 36.6 C  Resp: 18    Post vital signs: Reviewed and stable  Level of consciousness: sedated  Complications: No apparent anesthesia complications

## 2014-02-25 LAB — GLUCOSE, CAPILLARY: GLUCOSE-CAPILLARY: 146 mg/dL — AB (ref 70–99)

## 2014-02-25 LAB — CBC
HCT: 38.5 % — ABNORMAL LOW (ref 39.0–52.0)
Hemoglobin: 13.1 g/dL (ref 13.0–17.0)
MCH: 31.1 pg (ref 26.0–34.0)
MCHC: 34 g/dL (ref 30.0–36.0)
MCV: 91.4 fL (ref 78.0–100.0)
PLATELETS: 204 10*3/uL (ref 150–400)
RBC: 4.21 MIL/uL — ABNORMAL LOW (ref 4.22–5.81)
RDW: 13.6 % (ref 11.5–15.5)
WBC: 13 10*3/uL — AB (ref 4.0–10.5)

## 2014-02-25 NOTE — Progress Notes (Signed)
Clinical Social Work Department CLINICAL SOCIAL WORK PLACEMENT NOTE 02/25/2014  Patient:  Brad Miller, Brad Miller  Account Number:  [id number] Admit date:  02/23/2014  Clinical Social Worker:  Blima Rich, Latanya Presser  Date/time:  02/25/2014 06:03 PM  Clinical Social Work is seeking post-discharge placement for this patient at the following level of care:   SKILLED NURSING   (*CSW will update this form in Epic as items are completed)   02/25/2014  Patient/family provided with [facility] Department of Clinical Social Work's list of facilities offering this level of care within the geographic area requested by the patient (or if unable, by the patient's family).  02/25/2014  Patient/family informed of their freedom to choose among providers that offer the needed level of care, that participate in Medicare, Medicaid or managed care program needed by the patient, have an available bed and are willing to accept the patient.  02/25/2014  Patient/family informed of [facility]' ownership interest in [facility], as well as of the fact that they are under no obligation to receive care at this facility.  PASARR submitted to EDS on 02/25/2014 PASARR number received from EDS on 02/25/2014  FL2 transmitted to all facilities in geographic area requested by pt/family on  02/25/2014 FL2 transmitted to all facilities within larger geographic area on   Patient informed that his/her managed care company has contracts with or will negotiate with  certain facilities, including the following:     Patient/family informed of bed offers received:   Patient chooses bed at  Physician recommends and patient chooses bed at    Patient to be transferred to  on   Patient to be transferred to facility by   The following physician request were entered in Epic:   Additional Comments:

## 2014-02-25 NOTE — Progress Notes (Signed)
Physical Therapy Treatment Patient Details Name: Brad Miller MRN: [id number] DOB: 1951-06-28 Today's Date: 02/25/2014 Time: 7425-9563 PT Time Calculation (min): 45 min  PT Assessment / Plan / Recommendation  History of Present Illness Pt underwent R TKA and L knee scope 02-23-14.   PT Comments   Pt is making progress with mobility, still quite slow moving and needing physical assist for basic transfers; Not sure that his wife will be able to give adequate assist for safe dc home; Feel pt will benefit from postacute rehab (CIR versus SNF) to maximize independence and safety with mobility prior to dc home   Follow Up Recommendations  Other (comment) (postacute rehab)     Does the patient have the potential to tolerate intense rehabilitation     Barriers to Discharge        Equipment Recommendations  Rolling walker with 5" wheels    Recommendations for Other Services    Frequency 7X/week   Progress towards PT Goals Progress towards PT goals: Progressing toward goals  Plan Discharge plan needs to be updated    Precautions / Restrictions Precautions Precautions: Knee Restrictions RLE Weight Bearing: Weight bearing as tolerated LLE Weight Bearing: Weight bearing as tolerated   Pertinent Vitals/Pain 4.10 R knee end of session; more pain with WBing -- 7-8/10 patient repositioned for comfort and Optimal knee extension     Mobility  Bed Mobility Overal bed mobility: Needs Assistance Bed Mobility: Supine to Sit Supine to sit: Min assist General bed mobility comments: verbal cues for sequencing Transfers Overall transfer level: Needs assistance Equipment used: Rolling walker (2 wheeled) Transfers: Sit to/from Stand Sit to Stand: Mod assist (Heavy mod assist) General transfer comment: verbal cues for hand placement; heavy mod assist for power-up, with noted dependence on momentum Ambulation/Gait Ambulation/Gait assistance: Min assist Ambulation Distance (Feet): 18  Feet Assistive device: Rolling walker (2 wheeled) Gait Pattern/deviations: Step-to pattern Gait velocity: extremely slow General Gait Details: Cues to inrc step length biliaterally and for upright posture; extremely small steps and extremely slow cadence; Min assist mostly for rW progression    Exercises Total Joint Exercises Quad Sets: AROM;Both;10 reps Straight Leg Raises: AAROM;Both;10 reps   PT Diagnosis:    PT Problem List:   PT Treatment Interventions:     PT Goals (current goals can now be found in the care plan section) Acute Rehab PT Goals Patient Stated Goal: home PT Goal Formulation: With patient Time For Goal Achievement: 03/03/14 Potential to Achieve Goals: Good  Visit Information  Last PT Received On: 02/25/14 Assistance Needed: +2 (safety for amb) History of Present Illness: Pt underwent R TKA and L knee scope 02-23-14.    Subjective Data  Subjective: Agreeable to getting up; Also agreeable to postacute rehab after discussion Patient Stated Goal: home   Cognition  Cognition Arousal/Alertness: Awake/alert Behavior During Therapy: WFL for tasks assessed/performed Overall Cognitive Status: Within Functional Limits for tasks assessed    Balance     End of Session PT - End of Session Equipment Utilized During Treatment: Gait belt Activity Tolerance: Patient tolerated treatment well Patient left: in chair;with call bell/phone within reach;with family/visitor present Nurse Communication: Mobility status   GP     [place], [place], [place] Pager [phone] Office [phone]  02/25/2014, 1:41 PM

## 2014-02-25 NOTE — Progress Notes (Signed)
Clinical Education officer, museum (CSW) received referral for SNF placement. Per chart patient is going home with home health services. Please reconsult if further social work needs arise. CSW signing off.   Blima Rich, [place] CSW [phone]

## 2014-02-25 NOTE — Progress Notes (Signed)
   CARE MANAGEMENT NOTE 02/25/2014  Patient:  Brad Miller,Brad Miller   Account Number:  [id number]  Date Initiated:  02/25/2014  Documentation initiated by:  ,  Subjective/Objective Assessment:   admitted for right knee arthroplasty     Action/Plan:   home health PT, OT, RN   Anticipated DC Date:  02/25/2014   Anticipated DC Plan:  HOME W HOME HEALTH SERVICES      DC Planning Services  CM consult      PAC Choice  HOME HEALTH   Choice offered to / List presented to:  C-1 Patient        HH arranged  HH-1 RN  HH-2 PT  HH-3 OT      HH agency  [facility]   Status of service:  Completed, signed off Medicare Important Message given?   (If response is "NO", the following Medicare IM given date fields will be blank) Date Medicare IM given:   Date Additional Medicare IM given:    Discharge Disposition:  HOME W HOME HEALTH SERVICES  Per UR Regulation:    If discussed at Long Length of Stay Meetings, dates discussed:    Comments:  02/25/2014  1030   RN, CCM  [phone] CM referral: home health RN, PT, OT                     DME: 3:1, RW  Met with patient regarding home health RN, PT, OT. He selected [facility].  Patient states he has 3:1 and RW at home.  [facility]: referral per MD office prior to admission  

## 2014-02-25 NOTE — Progress Notes (Signed)
Physical Therapy Treatment Patient Details Name: Brad Miller MRN: [id number] DOB: 1951/01/19 Today's Date: 02/25/2014 Time: 1500-1530 PT Time Calculation (min): 30 min  PT Assessment / Plan / Recommendation  History of Present Illness Pt underwent R TKA and L knee scope 02-23-14.   PT Comments   Making slow progress; Wonder if Mr. Brad Miller' lethargy is related to the thorazine he had for hiccups  Follow Up Recommendations  Other (comment) (postacute rehab)     Does the patient have the potential to tolerate intense rehabilitation     Barriers to Discharge        Equipment Recommendations  Rolling walker with 5" wheels    Recommendations for Other Services    Frequency 7X/week   Progress towards PT Goals Progress towards PT goals: Progressing toward goals  Plan Discharge plan needs to be updated    Precautions / Restrictions Precautions Precautions: Knee Restrictions RLE Weight Bearing: Weight bearing as tolerated LLE Weight Bearing: Weight bearing as tolerated   Pertinent Vitals/Pain 6-7/10 knee pain, R greater than Left patient repositioned for comfort and Optimal knee extension     Mobility  Bed Mobility Overal bed mobility: Needs Assistance Bed Mobility: Supine to Sit Supine to sit: Min assist General bed mobility comments: verbal cues for sequencing Transfers Overall transfer level: Needs assistance Equipment used: Rolling walker (2 wheeled) Transfers: Sit to/from Stand Sit to Stand: Mod assist (Heavy mod assist) General transfer comment: verbal cues for hand placement; heavy mod assist for power-up, with noted dependence on momentum Ambulation/Gait Ambulation/Gait assistance: Mod assist;+2 safety/equipment Ambulation Distance (Feet): 22 Feet Assistive device: Rolling walker (2 wheeled) Gait Pattern/deviations: Decreased step length - right;Decreased step length - left Gait velocity: extremely slow General Gait Details: Cues to inrc step length biliaterally  and for upright posture; extremely small steps and continued extremely slow cadence; Min assist mostly for rW progression; Physical assist for RLE advancement and longer step length    Exercises     PT Diagnosis:    PT Problem List:   PT Treatment Interventions:     PT Goals (current goals can now be found in the care plan section) Acute Rehab PT Goals Patient Stated Goal: home PT Goal Formulation: With patient Time For Goal Achievement: 03/03/14 Potential to Achieve Goals: Good  Visit Information  Last PT Received On: 02/25/14 Assistance Needed: +2 (safety for amb) History of Present Illness: Pt underwent R TKA and L knee scope 02-23-14.    Subjective Data  Subjective: Pt and wif continue to be agreeable to rehab prior to dc home Patient Stated Goal: home   Cognition  Cognition Arousal/Alertness: Lethargic;Suspect due to medications Behavior During Therapy: [facility] for tasks assessed/performed Overall Cognitive Status: Within Functional Limits for tasks assessed    Balance     End of Session PT - End of Session Equipment Utilized During Treatment: Gait belt Activity Tolerance: Patient tolerated treatment well Patient left: in chair;with call bell/phone within reach;with family/visitor present Nurse Communication: Mobility status   GP     Brad Miller 02/25/2014, 5:18 PM Brad [place], [place] Pager [phone] Office [phone]

## 2014-02-25 NOTE — Progress Notes (Signed)
Clinical Social Work Department BRIEF PSYCHOSOCIAL ASSESSMENT 02/25/2014  Patient:  Brad Miller, Brad Miller     Account Number:  [id number]     Admit date:  02/23/2014  Clinical Social Worker:  Rolinda Roan  Date/Time:  02/25/2014 05:55 PM  Referred by:  Physician  Date Referred:  02/25/2014 Referred for  SNF Placement   Other Referral:   Interview type:  Family Other interview type:    PSYCHOSOCIAL DATA Living Status:  WIFE Admitted from facility:   Level of care:   Primary support name:  Nathaneil Feagans [phone] Primary support relationship to patient:  SPOUSE Degree of support available:   Very supportive.    CURRENT CONCERNS  Other Concerns:    SOCIAL WORK ASSESSMENT / PLAN Clinical Social Worker (CSW) received call from RN case manager that patient needs SNF placement. CSW met with patient and his wife at bedside. Wife reported that she can't care for patient at home and he needs to go to rehab. Wife is agreeable to SNF search in [place]. She does not have a preference in facilities at this point.   Assessment/plan status:  Psychosocial Support/Ongoing Assessment of Needs Other assessment/ plan:   Information/referral to community resources:   CSW gave wife SNF list.    PATIENT'S/FAMILY'S RESPONSE TO PLAN OF CARE: Wife and patient thanked CSW for visit and starting placement process.

## 2014-02-25 NOTE — Progress Notes (Signed)
PATIENT ID: Conley Canal   2 Days Post-Op Procedure(s) (LRB): TOTAL KNEE ARTHROPLASTY (Right) LEFT ARTHROSCOPY KNEE (Left)  Subjective: Reports doing well today. Right knee is sore from CPM, tolerable. Up with PT yesterday and was happy with progress. No other complaints or concerns.   Objective:  Filed Vitals:   02/25/14 0453  BP: 107/51  Pulse: 110  Temp: 98.2 F (36.8 C)  Resp: 18     Awake, alert, orientated R knee dressing/ACE c/d/i Left knee dressing c/d/i Wiggles toes, distally NVI Compartments soft, nontender  Labs:   Recent Labs  02/24/14 0446 02/25/14 0545  HGB 14.5 13.1   Recent Labs  02/24/14 0446 02/25/14 0545  WBC 15.4* 13.0*  RBC 4.70 4.21*  HCT 42.5 38.5*  PLT 253 204  No results found for this basename: NA, K, CL, CO2, BUN, CREATININE, GLUCOSE, CALCIUM,  in the last 72 hours  Assessment and Plan: 2 days s/p TOTAL KNEE ARTHROPLASTY (Right)  LEFT ARTHROSCOPY KNEE (Left)  ADDITIONAL DIAGNOSIS: Hypertension and Sleep Apnea PT/OT WBAT, CPM 5/hrs day until ROM 0-90 degrees, then D/C CPM  DVT Prophylaxis: SCDx72hrs, ASA 325 mg BID x 2 weeks  DISCHARGE PLAN: Home, seems like PT is rec HH PT and not SNF, therefore will put in orders to d/c home today if cleared by PT DISCHARGE NEEDS: HHPT, HHRN, CPM, Walker and 3-in-1 comode seat

## 2014-02-25 NOTE — Progress Notes (Signed)
Rehab Admissions Coordinator Note:  Patient was screened by Cleatrice Burke for appropriateness for an Inpatient Acute Rehab Consult.  At this time, we are recommending Inpatient Rehab consult per PT recommendations for CIR vs SNF. [employer] would have to approve any rehab venue.Please place order.  Cleatrice Burke 02/25/2014, 6:31 PM  I can be reached at [phone].

## 2014-02-26 LAB — CBC
HCT: 36.2 % — ABNORMAL LOW (ref 39.0–52.0)
HEMOGLOBIN: 12.3 g/dL — AB (ref 13.0–17.0)
MCH: 30.9 pg (ref 26.0–34.0)
MCHC: 34 g/dL (ref 30.0–36.0)
MCV: 91 fL (ref 78.0–100.0)
Platelets: 223 10*3/uL (ref 150–400)
RBC: 3.98 MIL/uL — ABNORMAL LOW (ref 4.22–5.81)
RDW: 13.4 % (ref 11.5–15.5)
WBC: 10.7 10*3/uL — AB (ref 4.0–10.5)

## 2014-02-26 LAB — GLUCOSE, CAPILLARY
GLUCOSE-CAPILLARY: 105 mg/dL — AB (ref 70–99)
GLUCOSE-CAPILLARY: 105 mg/dL — AB (ref 70–99)
GLUCOSE-CAPILLARY: 107 mg/dL — AB (ref 70–99)

## 2014-02-26 NOTE — Progress Notes (Signed)
Pt puts self on/off home device.  Pt aware he can call if needed.

## 2014-02-26 NOTE — Progress Notes (Signed)
-  OT Cancellation Note  Patient Details Name: Brad Miller MRN: [id number] DOB: 1951/10/06   Cancelled Treatment:     Attempted evaluation but pt declined stating that pain was too great. Tried again after two hours and pt had received medication and was asleep. Evaluation to be deferred until tomorrow.   Juluis Rainier [phone] 02/26/2014, 3:04 PM

## 2014-02-26 NOTE — Consult Note (Signed)
Physical Medicine and Rehabilitation Consult  Reason for Consult: R-TKR and left knee arthroscopy with partial menisectomy.  Referring Physician: Dr. Mayer Camel   HPI: Brad Miller is a 63 y.o. male with history of HTN, depression, Right Knee OA with failure of conservative therapy. He elected to undergo R-TKR as well as left knee arthroscopy with partial menisectomy and debridement of chondromalacia on 02/23/14 by Dr. Mayer Camel.  Is WBAT and on ASA for DVT prophylaxis.  Post op with  Hiccups as well as ABLA with leucocytosis. Therapies ongoing and CIR recommended by PT/MD.   Review of Systems  HENT: Negative for hearing loss.   Eyes: Negative for blurred vision.  Cardiovascular: Negative for chest pain and palpitations.  Gastrointestinal: Positive for constipation. Negative for heartburn, nausea and abdominal pain.  Genitourinary: Negative for urgency and frequency.  Musculoskeletal: Positive for joint pain. Negative for myalgias.  Neurological: Positive for weakness. Negative for headaches.    Past Medical History  Diagnosis Date  . Hyperlipidemia     TAKES LOVASTATIN NIGHTLY  . Depression     TAKES WELLBUTRIN DAILY  . Anxiety     TAKES XANAX DAILY AS NEEDED  . Hypertension     TAKES CARDURA DAILY  . Sleep apnea     USES CPAP  . Headache(784.0)     OCCASIONALLY  . Arthritis   . Joint pain   . Urinary frequency   . Urinary urgency   . Enlarged prostate   . Diabetes mellitus without complication     BORDERLINE  . Cataracts, bilateral   . Rash     AROUND NOSE AND STATES D/T NOT USING LOTION WHILE SHAVING   Past Surgical History  Procedure Laterality Date  . 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 No pertinent family history.  Social History:  Married. 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 He  reports that he has never smoked. He does not have any smokeless tobacco history on file. He  reports that he drinks alcohol occasionally.  He reports that he does not use illicit drugs.  Allergies: No Known Allergies  Medications Prior to Admission  Medication Sig Dispense Refill  . ALPRAZolam (XANAX) 0.5 MG tablet Take 0.5 mg by mouth 3 (three) times daily as needed for anxiety.      Marland Kitchen buPROPion (WELLBUTRIN SR) 150 MG 12 hr tablet Take 150 mg by mouth 2 (two) times daily.      Marland Kitchen doxazosin (CARDURA) 8 MG tablet Take 4 mg by mouth daily.      Marland Kitchen lovastatin (MEVACOR) 20 MG tablet Take 20 mg by mouth at bedtime.      Marland Kitchen PARoxetine (PAXIL) 30 MG tablet Take 30 mg by mouth daily.      Marland Kitchen testosterone cypionate (DEPOTESTOTERONE CYPIONATE) 200 MG/ML injection Inject 200 mg into the muscle every 28 (twenty-eight) days.      . vitamin B-12 (CYANOCOBALAMIN) 1000 MCG tablet Take 2,000 mcg by mouth daily.      . [DISCONTINUED] ibuprofen (ADVIL,MOTRIN) 800 MG tablet Take 800 mg by mouth every 8 (eight) hours as needed for fever, headache, mild pain or moderate pain.        Home: Home Living Family/patient expects to be discharged to:: Private residence Living Arrangements: Spouse/significant other Available Help at Discharge: Family;Available 24 hours/day Type of Home: Apartment Home Access: Stairs to enter Entrance Stairs-Number of Steps: 2 Entrance  Stairs-Rails: None Home Layout: Two level;1/2 bath on main level (bed available on main level) Alternate Level Stairs-Number of Steps: 12 Alternate Level Stairs-Rails: Right  Functional History:   Functional Status:  Mobility:     Ambulation/Gait Ambulation Distance (Feet): 80 Feet Gait velocity: slowed, but much improved from last session General Gait Details: Cues for posture and to self-monitor for bil stance stability in single limb stance    ADL:    Cognition: Cognition Overall Cognitive Status: Within Functional Limits for tasks assessed Orientation Level: Oriented X4 Cognition Arousal/Alertness: Awake/alert Behavior  During Therapy: WFL for tasks assessed/performed Overall Cognitive Status: Within Functional Limits for tasks assessed  Blood pressure 147/72, pulse 105, temperature 98.3 F (36.8 C), temperature source Axillary, resp. rate 18, height 5\' 10"  (1.778 m), weight 131.543 kg (290 lb), SpO2 100.00%. Physical Exam  Nursing note and vitals reviewed. Constitutional: He is oriented to person, place, and time. He appears well-developed and well-nourished.  Morbidly obese  HENT:  Head: Normocephalic and atraumatic.  Eyes: Conjunctivae are normal. Pupils are equal, round, and reactive to light.  Neck: Normal range of motion.  Cardiovascular: Normal rate and regular rhythm.   Respiratory: Effort normal and breath sounds normal. No respiratory distress. He has no wheezes.  GI: Soft. Bowel sounds are normal. He exhibits no distension. There is no tenderness.  Musculoskeletal:  R-knee with dry dressing and moderate edema. L-knee with dry compressive dressing and some discomfort with ROM.  R-knee with significant pain with attempts at ROM.   Neurological: He is alert and oriented to person, place, and time.  Skin: Skin is warm and dry.    Results for orders placed during the hospital encounter of 02/23/14 (from the past 24 hour(s))  GLUCOSE, CAPILLARY     Status: Abnormal   Collection Time    02/25/14  5:26 PM      Result Value Ref Range   Glucose-Capillary 146 (*) 70 - 99 mg/dL  CBC     Status: Abnormal   Collection Time    02/26/14  5:43 AM      Result Value Ref Range   WBC 10.7 (*) 4.0 - 10.5 K/uL   RBC 3.98 (*) 4.22 - 5.81 MIL/uL   Hemoglobin 12.3 (*) 13.0 - 17.0 g/dL   HCT 36.2 (*) 39.0 - 52.0 %   MCV 91.0  78.0 - 100.0 fL   MCH 30.9  26.0 - 34.0 pg   MCHC 34.0  30.0 - 36.0 g/dL   RDW 13.4  11.5 - 15.5 %   Platelets 223  150 - 400 K/uL  GLUCOSE, CAPILLARY     Status: Abnormal   Collection Time    02/26/14 11:57 AM      Result Value Ref Range   Glucose-Capillary 105 (*) 70 - 99 mg/dL     No results found.  Assessment/Plan: Diagnosis: OA right knee s/p Right TKA 1. Does the need for close, 24 hr/day medical supervision in concert with the patient's rehab needs make it unreasonable for this patient to be served in a less intensive setting? No 2. Co-Morbidities requiring supervision/potential complications: see above 3. Due to bladder management, bowel management, safety and skin/wound care, does the patient require 24 hr/day rehab nursing? No 4. Does the patient require coordinated care of a physician, rehab nurse, PT, OT to address physical and functional deficits in the context of the above medical diagnosis(es)? Yes Addressing deficits in the following areas: balance, endurance, locomotion, strength, transferring, bowel/bladder control, bathing, dressing  and feeding 5. Can the patient actively participate in an intensive therapy program of at least 3 hrs of therapy per day at least 5 days per week? Potentially 6. The potential for patient to make measurable gains while on inpatient rehab is fair 7. Anticipated functional outcomes upon discharge from inpatient rehab are na with PT, na with OT, na with SLP. 8. Estimated rehab length of stay to reach the above functional goals is: na 9. Does the patient have adequate social supports to accommodate these discharge functional goals? Yes 10. Anticipated D/C setting: Home 11. Anticipated post D/C treatments: [place] therapy 12. Overall Rehab/Functional Prognosis: excellent  RECOMMENDATIONS: This patient's condition is appropriate for continued rehabilitative care in the following setting: [facility] Patient has agreed to participate in recommended program. Yes Note that insurance prior authorization may be required for reimbursement for recommended care.  Comment: Pt can stay on first floor of home. Wife is at home. Cannot justify CIR based on medical necessity and functional needs. Should do just fine with HH progressing to outpt  therapies.  Meredith Staggers, MD, [facility]     02/26/2014

## 2014-02-26 NOTE — Progress Notes (Signed)
Physical Therapy Treatment Patient Details Name: Brad Miller MRN: [id number] DOB: 04/10/1951 Today's Date: 02/26/2014 Time: 7209-4709 PT Time Calculation (min): 38 min  PT Assessment / Plan / Recommendation  History of Present Illness Pt underwent R TKA and L knee scope 02-23-14.   PT Comments   Much improved mobility, with ability to progess amb distance; Cues for deep breathing with amb  Follow Up Recommendations  Other (comment) (postacute rehab)     Does the patient have the potential to tolerate intense rehabilitation     Barriers to Discharge        Equipment Recommendations  Rolling walker with 5" wheels    Recommendations for Other Services Rehab consult  Frequency 7X/week   Progress towards PT Goals Progress towards PT goals: Progressing toward goals  Plan Current plan remains appropriate    Precautions / Restrictions Precautions Precautions: Knee Restrictions RLE Weight Bearing: Weight bearing as tolerated LLE Weight Bearing: Weight bearing as tolerated   Pertinent Vitals/Pain 3/10 pain, reported pain is an "annoyance" patient repositioned for comfort and Optimal knee extension     Mobility  Bed Mobility Overal bed mobility: Needs Assistance Bed Mobility: Supine to Sit Supine to sit: Min assist General bed mobility comments: verbal cues for sequencing Transfers Overall transfer level: Needs assistance Equipment used: Rolling walker (2 wheeled) Transfers: Sit to/from Stand Sit to Stand: Min assist General transfer comment: Cues for safe hand placement; much improved rise Ambulation/Gait Ambulation/Gait assistance: Min assist;+2 safety/equipment Ambulation Distance (Feet): 80 Feet Assistive device: Rolling walker (2 wheeled) Gait Pattern/deviations: Step-to pattern Gait velocity: slowed, but much improved from last session General Gait Details: Cues for posture and to self-monitor for bil stance stability in single limb stance    Exercises     PT  Diagnosis:    PT Problem List:   PT Treatment Interventions:     PT Goals (current goals can now be found in the care plan section) Acute Rehab PT Goals Patient Stated Goal: home PT Goal Formulation: With patient Time For Goal Achievement: 03/03/14 Potential to Achieve Goals: Good  Visit Information  Last PT Received On: 02/26/14 Assistance Needed: +1 History of Present Illness: Pt underwent R TKA and L knee scope 02-23-14.    Subjective Data  Subjective: Feeling better today; reporting yesterday was a "blur" Patient Stated Goal: home   Cognition  Cognition Arousal/Alertness: Awake/alert Behavior During Therapy: WFL for tasks assessed/performed Overall Cognitive Status: Within Functional Limits for tasks assessed    Balance     End of Session PT - End of Session Equipment Utilized During Treatment: Gait belt Activity Tolerance: Patient tolerated treatment well Patient left: Other (comment);with family/visitor present;with call bell/phone within reach (On [facility]; RN aware) Nurse Communication: Mobility status;Other (comment) (desat on Room air) CPM Right Knee CPM Right Knee: Off   GP     Brad Miller Hamff 02/26/2014, 1:55 PM Brad Miller, [place]  Acute Rehabilitation Services Pager [phone] Office [phone]

## 2014-02-27 ENCOUNTER — Encounter (HOSPITAL_COMMUNITY): Payer: Self-pay | Admitting: Orthopedic Surgery

## 2014-02-27 DIAGNOSIS — M1710 Unilateral primary osteoarthritis, unspecified knee: Secondary | ICD-10-CM

## 2014-02-27 DIAGNOSIS — Z96659 Presence of unspecified artificial knee joint: Secondary | ICD-10-CM

## 2014-02-27 LAB — GLUCOSE, CAPILLARY
GLUCOSE-CAPILLARY: 97 mg/dL (ref 70–99)
Glucose-Capillary: 123 mg/dL — ABNORMAL HIGH (ref 70–99)
Glucose-Capillary: 150 mg/dL — ABNORMAL HIGH (ref 70–99)
Glucose-Capillary: 121 mg/dL — ABNORMAL HIGH (ref 70–99)

## 2014-02-27 MED ORDER — INSULIN ASPART 100 UNIT/ML ~~LOC~~ SOLN
0.0000 [IU] | Freq: Three times a day (TID) | SUBCUTANEOUS | Status: DC
Start: 2014-02-27 — End: 2014-02-28
  Administered 2014-02-27: 2 [IU] via SUBCUTANEOUS

## 2014-02-27 MED ORDER — BISACODYL 10 MG RE SUPP
10.0000 mg | Freq: Every day | RECTAL | Status: DC | PRN
  Administered 2014-02-27: 10 mg via RECTAL
  Filled 2014-02-27: qty 1

## 2014-02-27 NOTE — Progress Notes (Addendum)
Physical Therapy Treatment Patient Details Name: Brad Miller MRN: [id number] DOB: 12-06-1951 Today's Date: 02/27/2014 Time: 4259-5638 PT Time Calculation (min): 32 min  PT Assessment / Plan / Recommendation  History of Present Illness Pt underwent R TKA and L knee scope 02-23-14.   PT Comments   Fatigued this afternoon, but still willing to work; Pt and wife with specific questions about bed mobility, and they were addressed; pt is wondering about getting a hospital bed, which is not entirely out of the question given his size -- but am not sure if it is covered by insurance -- will defer to Case Mgmnt   Pt/wife are nervous about dc'ing home, especially with his fatigue this afternoon;Reinforced to pt and wife his progress, in particular since Sunday, and assured them that we will continue address concerns each PT session   Follow Up Recommendations  Home health PT;Supervision/Assistance - 24 hour     Does the patient have the potential to tolerate intense rehabilitation     Barriers to Discharge        Equipment Recommendations  Rolling walker with 5" wheels;3in1 (PT) (Wide BSC)  Pt and wife are interested in a hospital bed   Recommendations for Other Services OT consult  Frequency 7X/week   Progress towards PT Goals Progress towards PT goals: Progressing toward goals  Plan Discharge plan needs to be updated    Precautions / Restrictions Precautions Precautions: Knee Restrictions Weight Bearing Restrictions: Yes RLE Weight Bearing: Weight bearing as tolerated LLE Weight Bearing: Weight bearing as tolerated   Pertinent Vitals/Pain 6/10 R knee patient repositioned for comfort and Optimal knee extension      Mobility  Bed Mobility Overal bed mobility: Needs Assistance Bed Mobility: Supine to Sit;Sit to Supine Supine to sit: Min assist Sit to supine: Min guard General bed mobility comments: Verbal and demo cues for ways for his wife to assist pt OOB; step-by-step Cues  for technique; wife demonstrated acceptable technique Transfers Overall transfer level: Needs assistance Equipment used: Rolling walker (2 wheeled) Transfers: Sit to/from Stand Sit to Stand: Min assist General transfer comment: Wife steadied RW, so that pt could pull up on it Ambulation/Gait Ambulation/Gait assistance: Min guard Ambulation Distance (Feet): 5 Feet Assistive device: Rolling walker (2 wheeled) Gait Pattern/deviations: Step-to pattern Gait velocity: decr General Gait Details: Cues for posture and to self-monitor for bil stance stability in single limb stance    Exercises     PT Diagnosis:    PT Problem List:   PT Treatment Interventions:     PT Goals (current goals can now be found in the care plan section) Acute Rehab PT Goals Patient Stated Goal: home PT Goal Formulation: With patient Time For Goal Achievement: 03/03/14 Potential to Achieve Goals: Good  Visit Information  Last PT Received On: 02/27/14 Assistance Needed: +1 History of Present Illness: Pt underwent R TKA and L knee scope 02-23-14.    Subjective Data  Subjective: Reported weak feeling this afternoon; doesn't feel like he has had adequate bowel movements, and has not eaten much; Pt and wife are asking about getting a hospital bed Patient Stated Goal: home   Cognition  Cognition Arousal/Alertness: Awake/alert Behavior During Therapy: WFL for tasks assessed/performed Overall Cognitive Status: Within Functional Limits for tasks assessed    Balance     End of Session PT - End of Session Equipment Utilized During Treatment: Gait belt Activity Tolerance: Patient tolerated treatment well Patient left: in chair;with call bell/phone within reach;with family/visitor present Nurse Communication:  Mobility status CPM Right Knee CPM Right Knee: Off   GP     [name] 02/27/2014, 4:52 PM  Roney Marion, [place] Pager [phone] Office [phone]

## 2014-02-27 NOTE — Progress Notes (Signed)
Physical Therapy Treatment Patient Details Name: Brad Miller MRN: [id number] DOB: 14-Jan-1951 Today's Date: 02/27/2014 Time: 3419-3790 PT Time Calculation (min): 43 min  PT Assessment / Plan / Recommendation  History of Present Illness Pt underwent R TKA and L knee scope 02-23-14.   PT Comments   Worked hard in PT today despite not feeling well -- continues to make progress with mobility, in particular with sit to stand; Discussed dc plan for home with pt and wife, and they are much more confident given progress with functional mobility  Taught wife in technique for stairs; Plan for stair training next session  Follow Up Recommendations  Home health PT;Supervision/Assistance - 24 hour     Does the patient have the potential to tolerate intense rehabilitation     Barriers to Discharge        Equipment Recommendations  Rolling walker with 5" wheels    Recommendations for Other Services OT consult  Frequency 7X/week   Progress towards PT Goals Progress towards PT goals: Progressing toward goals  Plan Discharge plan needs to be updated    Precautions / Restrictions Precautions Precautions: Knee Restrictions RLE Weight Bearing: Weight bearing as tolerated LLE Weight Bearing: Weight bearing as tolerated   Pertinent Vitals/Pain 7-8/10 R knee patient repositioned for comfort and Optimal knee extension     Mobility  Transfers Overall transfer level: Needs assistance Equipment used: Rolling walker (2 wheeled) Transfers: Sit to/from Stand Sit to Stand: Min guard General transfer comment: Cues for safe hand placement; much improved rise, including from a low commode Ambulation/Gait Ambulation/Gait assistance: Min guard Ambulation Distance (Feet): 60 Feet Assistive device: Rolling walker (2 wheeled) Gait Pattern/deviations: Step-to pattern Gait velocity: decr General Gait Details: Cues for posture and to self-monitor for bil stance stability in single limb stance    Exercises  Total Joint Exercises Heel Slides: AROM;Right;10 reps;Seated (pulling heel to touch chair he is sitting in) Long Arc Quad: AAROM;AROM;Right;Left;10 reps;Seated (alternating)   PT Diagnosis:    PT Problem List:   PT Treatment Interventions:     PT Goals (current goals can now be found in the care plan section) Acute Rehab PT Goals Patient Stated Goal: home PT Goal Formulation: With patient Time For Goal Achievement: 03/03/14 Potential to Achieve Goals: Good  Visit Information  Last PT Received On: 02/27/14 Assistance Needed: +1 History of Present Illness: Pt underwent R TKA and L knee scope 02-23-14.    Subjective Data  Subjective: Feels lousy, but very willing to work; Is aware and agreeable to the change in dc plan to go home Patient Stated Goal: home   Cognition  Cognition Arousal/Alertness: Awake/alert Behavior During Therapy: WFL for tasks assessed/performed Overall Cognitive Status: Within Functional Limits for tasks assessed    Balance     End of Session PT - End of Session Equipment Utilized During Treatment: Gait belt Activity Tolerance: Patient tolerated treatment well Patient left: in chair;with call bell/phone within reach;with family/visitor present Nurse Communication: Mobility status   GP     [name] 02/27/2014, 12:01 PM  Roney Marion, [name] Pager [phone] Office [phone]

## 2014-02-27 NOTE — Progress Notes (Signed)
Orthopedic Tech Progress Note Patient Details:  Kavonte Bearse Nov 17, 1951 [id number] On cpm at 8:15 pm RLE 60 Patient ID: Conley Canal, male   DOB: May 28, 1951, 63 y.o.   MRN: [id number]   Braulio Bosch 02/27/2014, 8:15 PM

## 2014-02-27 NOTE — Progress Notes (Signed)
An inpt rehab admission can not be justified for this diagnosis at this time. See Dr. Naaman Plummer consult today. I will notify RN CM. [phone]

## 2014-02-27 NOTE — Progress Notes (Signed)
Occupational Therapy Evaluation and Discharge Patient Details Name: Brad Miller MRN: [id number] DOB: 18-Feb-1951 Today's Date: 02/27/2014 Time: 8099-8338 OT Time Calculation (min): 29 min  OT Assessment / Plan / Recommendation History of present illness Pt underwent R TKA and L knee scope 02-23-14.   Clinical Impression   PTA pt lived with wife and was Independent in ADLs and mobility. Pt lethargic and c/o pain this date. After multiple attempts to eval, family education provided to maximize independence and decrease caregiver stress. Pt later observed to transfer and ambulate to the bathroom. Pt's wife states that she will be home with him 24/7. Pt will plan to take sponge baths until he is able to get up to his second level bathroom for a shower. Pt's wife educated in [place] transfer using RW and 3 in 1 commode in the tub. Education and training completed regarding compensatory strategies for LB dressing. No further acute OT needs at this time.     OT Assessment  Patient does not need any further OT services    Follow Up Recommendations  Supervision/Assistance - 24 hour       Equipment Recommendations  None recommended by OT          Precautions / Restrictions Precautions Precautions: Knee Restrictions Weight Bearing Restrictions: Yes RLE Weight Bearing: Weight bearing as tolerated LLE Weight Bearing: Weight bearing as tolerated   Pertinent Vitals/Pain Pt lethargic and complained of feeling "miserable." Notified RN.    ADL  Eating/Feeding: Independent Where Assessed - Eating/Feeding: Chair Grooming: Set up Where Assessed - Grooming: Supported sitting Upper Body Bathing: Set up Where Assessed - Upper Body Bathing: Unsupported sitting Lower Body Bathing: Moderate assistance Where Assessed - Lower Body Bathing: Unsupported sit to stand Upper Body Dressing: Set up Where Assessed - Upper Body Dressing: Unsupported sitting Lower Body Dressing: Maximal assistance Where  Assessed - Lower Body Dressing: Unsupported sit to stand Toilet Transfer: Min guard Toilet Transfer Method: Sit to Loss adjuster, chartered: Comfort height toilet Toileting - Water quality scientist and Hygiene: Min guard Where Assessed - Toileting Clothing Manipulation and Hygiene: Sit to stand from 3-in-1 or toilet Equipment Used: Rolling walker;Gait belt ADL Comments: Pt limited by lethargy, deconditioning, and pain for ADLs. Pt's wife will be home 24/7 to assist.        Visit Information  Last OT Received On: 02/27/14 Assistance Needed: +1 History of Present Illness: Pt underwent R TKA and L knee scope 02-23-14.       Prior [place] expects to be discharged to:: Private residence Living Arrangements: Spouse/significant other Available Help at Discharge: Family;Available 24 hours/day Type of Home: Apartment Home Access: Stairs to enter Entrance Stairs-Number of Steps: 2 Entrance Stairs-Rails: None Home Layout: Two level;1/2 bath on main level;Other (Comment) (family has captain's bed setup for pt on main level) Alternate Level Stairs-Number of Steps: 12 Alternate Level Stairs-Rails: Right Home Equipment: Walker - 2 wheels;Bedside commode;Other (comment) (CPM) Prior Function Level of Independence: Independent Communication Communication: No difficulties         Vision/Perception Vision - History Patient Visual Report: No change from baseline   Cognition  Cognition Arousal/Alertness: Awake/alert Behavior During Therapy: WFL for tasks assessed/performed Overall Cognitive Status: Within Functional Limits for tasks assessed    Extremity/Trunk Assessment Upper Extremity Assessment Upper Extremity Assessment: Overall WFL for tasks assessed (slight UE weakness likely due to deconditioning)     Mobility Transfers Overall transfer level: Needs assistance Equipment used: Rolling walker (2 wheeled)  Transfers: Sit to/from  Stand Sit to Stand: Min guard           End of Session OT - End of Session Equipment Utilized During Treatment: Gait belt;Rolling walker Activity Tolerance: Patient limited by lethargy;Patient limited by fatigue;Patient limited by pain Patient left: in bed;with call bell/phone within reach;with family/visitor present CPM Right Knee CPM Right Knee: Off       Juluis Rainier [phone] 02/27/2014, 3:33 PM

## 2014-02-27 NOTE — Progress Notes (Signed)
PATIENT ID: Conley Canal   4 Days Post-Op Procedure(s) (LRB): TOTAL KNEE ARTHROPLASTY (Right) LEFT ARTHROSCOPY KNEE (Left)  Subjective: Patients reports feeling good this morning. Pain improving and controlled. Ready to d/c to rehab today. No other complaints or concerns.   Objective:  Filed Vitals:   02/27/14 0600  BP: 148/88  Pulse: 102  Temp: 98.7 F (37.1 C)  Resp: 18     Bilat knee incisions appear benign No erythema, warmth Wiggles toes, distally NVI r & L calf soft, nontender  Labs:   Recent Labs  02/25/14 0545 02/26/14 0543  HGB 13.1 12.3*   Recent Labs  02/25/14 0545 02/26/14 0543  WBC 13.0* 10.7*  RBC 4.21* 3.98*  HCT 38.5* 36.2*  PLT 204 223  No results found for this basename: NA, K, CL, CO2, BUN, CREATININE, GLUCOSE, CALCIUM,  in the last 72 hours  Assessment and Plan: Plan for d/c to inpatient rehab yesterday per PT and looks like rehab evaluated and approved Dressing change today LEFT ARTHROSCOPY KNEE (Left)  ADDITIONAL DIAGNOSIS: Hypertension and Sleep Apnea  PT/OT WBAT, CPM 5/hrs day until ROM 0-90 degrees, then D/C CPM  DVT Prophylaxis: SCDx72hrs, ASA 325 mg BID x 2 weeks  DISCHARGE NEEDS: HHPT, HHRN, CPM, Walker and 3-in-1 comode seat Scripts signed/in chart

## 2014-02-28 LAB — GLUCOSE, CAPILLARY
GLUCOSE-CAPILLARY: 103 mg/dL — AB (ref 70–99)
Glucose-Capillary: 109 mg/dL — ABNORMAL HIGH (ref 70–99)

## 2014-02-28 NOTE — Progress Notes (Signed)
PATIENT ID: Brad Miller   5 Days Post-Op Procedure(s) (LRB): TOTAL KNEE ARTHROPLASTY (Right) LEFT ARTHROSCOPY KNEE (Left)  Subjective: Doing well, pain and discomfort improving daily. Soreness in right knee, left knee with improved pain from preoperatively and happy with results from surgery.  Ready to go home. No complaints or concerns.   Objective:  Filed Vitals:   02/28/14 0526  BP: 112/60  Pulse: 95  Temp: 98.2 F (36.8 C)  Resp: 18     Bilateral knee dressing c/d/i No surrounding warmth, erythema,  No calf tenderness, compartments soft Wiggles toes, distally NVI  Labs:   Recent Labs  02/26/14 0543  HGB 12.3*   Recent Labs  02/26/14 0543  WBC 10.7*  RBC 3.98*  HCT 36.2*  PLT 223  No results found for this basename: NA, K, CL, CO2, BUN, CREATININE, GLUCOSE, CALCIUM,  in the last 72 hours  Assessment and Plan:LEFT ARTHROSCOPY KNEE (Left)  ADDITIONAL DIAGNOSIS: Hypertension and Sleep Apnea  Improving with PT/OT- now recommend home health D/c home today D/c order in and scripts for home meds in chart PT/OT WBAT, CPM 5/hrs day until ROM 0-90 degrees, then D/C CPM  DVT Prophylaxis: SCDx72hrs, ASA 325 mg BID x 2 weeks  DISCHARGE NEEDS: HHPT, HHRN, CPM, Walker and 3-in-1 comode seat

## 2014-02-28 NOTE — Discharge Summary (Signed)
Patient ID: Brad Miller MRN: [id number] DOB/AGE: 21-Dec-1951 63 y.o.  Admit date: 02/23/2014 Discharge date: 02/28/2014  Admission Diagnoses:  Principal Problem:   Arthritis of knee, right Active Problems:   Arthritis of right knee   Discharge Diagnoses:  Same  Past Medical History  Diagnosis Date  . Hyperlipidemia     TAKES LOVASTATIN NIGHTLY  . Depression     TAKES WELLBUTRIN DAILY  . Anxiety     TAKES XANAX DAILY AS NEEDED  . Hypertension     TAKES CARDURA DAILY  . Sleep apnea     USES CPAP  . Headache(784.0)     OCCASIONALLY  . Arthritis   . Joint pain   . Urinary frequency   . Urinary urgency   . Enlarged prostate   . Diabetes mellitus without complication     BORDERLINE  . Cataracts, bilateral   . Rash     AROUND NOSE AND STATES D/T NOT USING LOTION WHILE SHAVING    Surgeries: Procedure(s): TOTAL KNEE ARTHROPLASTY LEFT ARTHROSCOPY KNEE on 02/23/2014   Consultants:    Discharged Condition: Improved  Hospital Course: Wilburn Keir is an 63 y.o. male who was admitted 02/23/2014 for operative treatment ofArthritis of knee, right. Patient has severe unremitting pain that affects sleep, daily activities, and work/hobbies. After pre-op clearance the patient was taken to the operating room on 02/23/2014 and underwent  Procedure(s): TOTAL KNEE ARTHROPLASTY LEFT ARTHROSCOPY KNEE.    Patient was given perioperative antibiotics: Anti-infectives   Start     Dose/Rate Route Frequency Ordered Stop   02/23/14 1143  cefUROXime (ZINACEF) injection  Status:  Discontinued       As needed 02/23/14 1144 02/23/14 1258   02/23/14 0600  ceFAZolin (ANCEF) 3 g in dextrose 5 % 50 mL IVPB     3 g 160 mL/hr over 30 Minutes Intravenous On call to O.R. 02/22/14 1409 02/23/14 1047       Patient was given sequential compression devices, early ambulation, and ASA 325mg  BID to prevent DVT.  Patient benefited maximally from hospital stay and there were no complications.  He was kept  several days to be cleared by physical therapy to return home.  Recent vital signs: Patient Vitals for the past 24 hrs:  BP Temp Pulse Resp SpO2  02/28/14 1319 134/76 mmHg 97.2 F (36.2 C) 100 18 98 %  02/28/14 0526 112/60 mmHg 98.2 F (36.8 C) 95 18 96 %  02/28/14 0400 - - - 18 96 %  02/28/14 0000 - - - 18 97 %  02/27/14 2340 - - 94 18 97 %  02/27/14 2116 144/72 mmHg 99.2 F (37.3 C) 98 18 98 %  02/27/14 2000 - - - 18 98 %     Recent laboratory studies:  Recent Labs  02/26/14 0543  WBC 10.7*  HGB 12.3*  HCT 36.2*  PLT 223     Discharge Medications:     Medication List    STOP taking these medications       ibuprofen 800 MG tablet  Commonly known as:  ADVIL,MOTRIN      TAKE these medications       ALPRAZolam 0.5 MG tablet  Commonly known as:  XANAX  Take 0.5 mg by mouth 3 (three) times daily as needed for anxiety.     aspirin EC 325 MG tablet  Take 1 tablet (325 mg total) by mouth 2 (two) times daily.     buPROPion 150 MG 12 hr tablet  Commonly known  as:  WELLBUTRIN SR  Take 150 mg by mouth 2 (two) times daily.     doxazosin 8 MG tablet  Commonly known as:  CARDURA  Take 4 mg by mouth daily.     lovastatin 20 MG tablet  Commonly known as:  MEVACOR  Take 20 mg by mouth at bedtime.     methocarbamol 500 MG tablet  Commonly known as:  ROBAXIN  Take 1 tablet (500 mg total) by mouth 2 (two) times daily with a meal.     oxyCODONE-acetaminophen 5-325 MG per tablet  Commonly known as:  ROXICET  Take 1 tablet by mouth every 4 (four) hours as needed.     PARoxetine 30 MG tablet  Commonly known as:  PAXIL  Take 30 mg by mouth daily.     testosterone cypionate 200 MG/ML injection  Commonly known as:  DEPOTESTOTERONE CYPIONATE  Inject 200 mg into the muscle every 28 (twenty-eight) days.     vitamin B-12 1000 MCG tablet  Commonly known as:  CYANOCOBALAMIN  Take 2,000 mcg by mouth daily.        Diagnostic Studies: Dg Chest 2 View  02/20/2014    CLINICAL DATA:  Preop arthroscopy.  EXAM: CHEST  2 VIEW  COMPARISON:  January 20, 2007.  FINDINGS: The heart size and mediastinal contours are within normal limits. Both lungs are clear. The visualized skeletal structures are unremarkable.  IMPRESSION: No active cardiopulmonary disease.   Electronically Signed   By: Roque Lias M.D.   On: 02/20/2014 16:38    Disposition: 01-Home or Self Care      Discharge Orders   Future Orders Complete By Expires   Call MD / Call 911  As directed    Comments:     If you experience chest pain or shortness of breath, CALL 911 and be transported to the hospital emergency room.  If you develope a fever above 101 F, pus (white drainage) or increased drainage or redness at the wound, or calf pain, call your surgeon's office.   Call MD / Call 911  As directed    Comments:     If you experience chest pain or shortness of breath, CALL 911 and be transported to the hospital emergency room.  If you develope a fever above 101 F, pus (white drainage) or increased drainage or redness at the wound, or calf pain, call your surgeon's office.   Call MD / Call 911  As directed    Comments:     If you experience chest pain or shortness of breath, CALL 911 and be transported to the hospital emergency room.  If you develope a fever above 101 F, pus (white drainage) or increased drainage or redness at the wound, or calf pain, call your surgeon's office.   Constipation Prevention  As directed    Comments:     Drink plenty of fluids.  Prune juice may be helpful.  You may use a stool softener, such as Colace (over the counter) 100 mg twice a day.  Use MiraLax (over the counter) for constipation as needed.   Constipation Prevention  As directed    Comments:     Drink plenty of fluids.  Prune juice may be helpful.  You may use a stool softener, such as Colace (over the counter) 100 mg twice a day.  Use MiraLax (over the counter) for constipation as needed.   Constipation Prevention   As directed    Comments:     Drink  plenty of fluids.  Prune juice may be helpful.  You may use a stool softener, such as Colace (over the counter) 100 mg twice a day.  Use MiraLax (over the counter) for constipation as needed.   Diet - low sodium heart healthy  As directed    Diet - low sodium heart healthy  As directed    Diet - low sodium heart healthy  As directed    Increase activity slowly as tolerated  As directed    Increase activity slowly as tolerated  As directed    Increase activity slowly as tolerated  As directed       Follow-up Information   Follow up with Kerin Salen, MD In 2 weeks.   Specialty:  Orthopedic Surgery   Contact information:   [place] [address] [phone]       Follow up with [place]. (RN, PT, OT visits)    Contact information:   [address] [place] [address] [phone]        Signed: Grier Mitts 02/28/2014, 5:04 PM

## 2014-02-28 NOTE — Progress Notes (Signed)
Placed patient on CPAP via patient home [facility], auto titrate settings ([facility]).  Patient tolerating well at this time. RN aware.

## 2014-02-28 NOTE — Progress Notes (Signed)
Discharge instructions and prescriptions given to and reviewed with patient. Patient denies questions or concerns. Patient discharged via wheelchair accompanied by wife, with discharge packet, prescriptions, and personal belongings.

## 2014-02-28 NOTE — Clinical Documentation Improvement (Signed)
Possible Clinical conditions:  Morbid Obesity W/ BMI= 41.7  Underweight w/BMI  Other condition  Cannot clinically determine    Thank You, Alessandra Grout, RN, BSN, CCDS, Clinical Documentation Specialist:  [phone]   Cell=[phone] [place]- Health Information Management

## 2014-02-28 NOTE — Care Management Note (Signed)
CARE MANAGEMENT NOTE 02/28/2014  Patient:  Brad Miller, Brad Miller   Account Number:  [id number]  Date Initiated:  02/25/2014  Documentation initiated by:  Lizabeth Leyden  Subjective/Objective Assessment:   admitted for right knee arthroplasty     Action/Plan:   home health PT, OT, RN  patient unable to access bedroom at home, requested hospital bed. CM informed him of rental cost, patient wants to get bed.Order placed with [facility] DME liasion.   Anticipated DC Date:  02/28/2014   Anticipated DC Plan:  [place]  CM consult      [facility] Choice  HOME HEALTH   Choice offered to / List presented to:  C-1 Patient   DME arranged  CPM  WALKER - ROLLING  3-N-1  HOSPITAL BED      DME agency  [facility] arranged  HH-1 RN  [place].   Status of service:  Completed, signed off

## 2014-02-28 NOTE — Progress Notes (Signed)
Physical Therapy Treatment Patient Details Name: Brad Miller MRN: [id number] DOB: 1951/11/09 Today's Date: 02/28/2014 Time: 3474-2595 PT Time Calculation (min): 41 min  PT Assessment / Plan / Recommendation  History of Present Illness Pt underwent R TKA and L knee scope 02-23-14.   PT Comments   Per MD note, plan is to DC today, however wife is very nervous about taking patient home and not being able to provide him with assistance. Patient was able to complete stair training with Min A as well as transfer. Wife is anxious that she cannot provide this at home. IF wife does not feel like she can assist patient at home, may need to look at SNF options. Will follow up this afternoon  Follow Up Recommendations  Home health PT;Supervision/Assistance - 24 hour     Does the patient have the potential to tolerate intense rehabilitation     Barriers to Discharge        Equipment Recommendations  Rolling walker with 5" wheels;3in1 (PT)    Recommendations for Other Services    Frequency 7X/week   Progress towards PT Goals Progress towards PT goals: Progressing toward goals  Plan Current plan remains appropriate    Precautions / Restrictions Precautions Precautions: Knee Restrictions RLE Weight Bearing: Weight bearing as tolerated LLE Weight Bearing: Weight bearing as tolerated   Pertinent Vitals/Pain no apparent distress     Mobility  Bed Mobility Overal bed mobility: Needs Assistance Bed Mobility: Supine to Sit;Sit to Supine Supine to sit: Min guard General bed mobility comments: MinGuard for safety and cues for technique. patient with use of rails to get to EOB Transfers Overall transfer level: Needs assistance Equipment used: Rolling walker (2 wheeled) Sit to Stand: Min assist General transfer comment: Very Min A to support with standing. Cues for best technique Ambulation/Gait Ambulation/Gait assistance: Min guard Ambulation Distance (Feet): 120 Feet Assistive device:  Rolling walker (2 wheeled) Gait Pattern/deviations: Step-through pattern;Decreased stride length Gait velocity: decr General Gait Details: Cues to stand upright and to increase speed as tolerated.  Stairs: Yes Stairs assistance: Min assist Stair Management: Step to pattern;Backwards;With walker;No rails Number of Stairs: 4 General stair comments: Patient able to follow sequency and technique well. Min A for RW stability    Exercises Total Joint Exercises Quad Sets: AROM;Both;10 reps Heel Slides: AAROM;Both;10 reps Hip ABduction/ADduction: AAROM;Right;10 reps Straight Leg Raises: AAROM;Both;10 reps   PT Diagnosis:    PT Problem List:   PT Treatment Interventions:     PT Goals (current goals can now be found in the care plan section)    Visit Information  Last PT Received On: 02/28/14 Assistance Needed: +1 History of Present Illness: Pt underwent R TKA and L knee scope 02-23-14.    Subjective Data      Cognition  Cognition Arousal/Alertness: Awake/alert Behavior During Therapy: WFL for tasks assessed/performed Overall Cognitive Status: Within Functional Limits for tasks assessed    Balance     End of Session PT - End of Session Equipment Utilized During Treatment: Gait belt Activity Tolerance: Patient tolerated treatment well Patient left: in chair;with call bell/phone within reach;with family/visitor present Nurse Communication: Mobility status   GP     Jacqualyn Posey 02/28/2014, 1:38 PM 02/28/2014 Jacqualyn Posey PTA [phone] pager [phone] office

## 2014-08-31 ENCOUNTER — Encounter: Payer: Self-pay | Admitting: *Deleted

## 2015-10-30 ENCOUNTER — Emergency Department (HOSPITAL_COMMUNITY): Payer: Commercial Managed Care - HMO

## 2015-10-30 ENCOUNTER — Ambulatory Visit (HOSPITAL_COMMUNITY)
Admission: AD | Admit: 2015-10-30 | Discharge: 2015-10-30 | Disposition: A | Discharge status: Home / Self Care | Payer: Commercial Managed Care - HMO | Attending: Psychiatry | Admitting: Psychiatry

## 2015-10-30 ENCOUNTER — Emergency Department (HOSPITAL_COMMUNITY)
Admission: EM | Admit: 2015-10-30 | Discharge: 2015-10-30 | Disposition: A | Discharge status: Home / Self Care | Payer: Commercial Managed Care - HMO | Attending: Emergency Medicine | Admitting: Emergency Medicine

## 2015-10-30 DIAGNOSIS — I10 Essential (primary) hypertension: Secondary | ICD-10-CM | POA: Insufficient documentation

## 2015-10-30 DIAGNOSIS — Z79899 Other long term (current) drug therapy: Secondary | ICD-10-CM | POA: Insufficient documentation

## 2015-10-30 DIAGNOSIS — E785 Hyperlipidemia, unspecified: Secondary | ICD-10-CM | POA: Diagnosis not present

## 2015-10-30 DIAGNOSIS — H269 Unspecified cataract: Secondary | ICD-10-CM | POA: Diagnosis not present

## 2015-10-30 DIAGNOSIS — F329 Major depressive disorder, single episode, unspecified: Secondary | ICD-10-CM | POA: Diagnosis not present

## 2015-10-30 DIAGNOSIS — M1990 Unspecified osteoarthritis, unspecified site: Secondary | ICD-10-CM | POA: Insufficient documentation

## 2015-10-30 DIAGNOSIS — R4789 Other speech disturbances: Secondary | ICD-10-CM | POA: Diagnosis not present

## 2015-10-30 DIAGNOSIS — G4730 Sleep apnea, unspecified: Secondary | ICD-10-CM | POA: Insufficient documentation

## 2015-10-30 DIAGNOSIS — Z880 Allergy status to penicillin: Secondary | ICD-10-CM | POA: Insufficient documentation

## 2015-10-30 DIAGNOSIS — R4689 Other symptoms and signs involving appearance and behavior: Secondary | ICD-10-CM

## 2015-10-30 DIAGNOSIS — F919 Conduct disorder, unspecified: Secondary | ICD-10-CM | POA: Insufficient documentation

## 2015-10-30 DIAGNOSIS — F419 Anxiety disorder, unspecified: Secondary | ICD-10-CM | POA: Insufficient documentation

## 2015-10-30 DIAGNOSIS — Z008 Encounter for other general examination: Secondary | ICD-10-CM | POA: Diagnosis present

## 2015-10-30 DIAGNOSIS — F32A Depression, unspecified: Secondary | ICD-10-CM

## 2015-10-30 DIAGNOSIS — Z7982 Long term (current) use of aspirin: Secondary | ICD-10-CM | POA: Diagnosis not present

## 2015-10-30 DIAGNOSIS — R479 Unspecified speech disturbances: Secondary | ICD-10-CM

## 2015-10-30 DIAGNOSIS — R4189 Other symptoms and signs involving cognitive functions and awareness: Secondary | ICD-10-CM

## 2015-10-30 LAB — COMPREHENSIVE METABOLIC PANEL
ALBUMIN: 4.2 g/dL (ref 3.5–5.0)
ALK PHOS: 68 U/L (ref 38–126)
ALT: 38 U/L (ref 17–63)
ANION GAP: 11 (ref 5–15)
AST: 16 U/L (ref 15–41)
BILIRUBIN TOTAL: 1 mg/dL (ref 0.3–1.2)
BUN: 16 mg/dL (ref 6–20)
CALCIUM: 9.4 mg/dL (ref 8.9–10.3)
CO2: 24 mmol/L (ref 22–32)
Chloride: 103 mmol/L (ref 101–111)
Creatinine, Ser: 0.82 mg/dL (ref 0.61–1.24)
GFR calc non Af Amer: >60 mL/min (ref >60)
Glucose, Bld: 109 mg/dL — ABNORMAL HIGH (ref 65–99)
Potassium: 4 mmol/L (ref 3.5–5.1)
Sodium: 138 mmol/L (ref 135–145)
TOTAL PROTEIN: 7.4 g/dL (ref 6.5–8.1)

## 2015-10-30 LAB — URINALYSIS, ROUTINE W REFLEX MICROSCOPIC
Glucose, UA: NEGATIVE mg/dL
HGB URINE DIPSTICK: NEGATIVE
Ketones, ur: NEGATIVE mg/dL
Leukocytes, UA: NEGATIVE
NITRITE: NEGATIVE
PH: 6 (ref 5.0–8.0)
Protein, ur: NEGATIVE mg/dL
SPECIFIC GRAVITY, URINE: 1.028 (ref 1.005–1.030)
UROBILINOGEN UA: 1 mg/dL (ref 0.0–1.0)

## 2015-10-30 LAB — CBC
HCT: 43.6 % (ref 39.0–52.0)
Hemoglobin: 14.8 g/dL (ref 13.0–17.0)
MCH: 29.7 pg (ref 26.0–34.0)
MCHC: 33.9 g/dL (ref 30.0–36.0)
MCV: 87.6 fL (ref 78.0–100.0)
PLATELETS: 256 10*3/uL (ref 150–400)
RBC: 4.98 MIL/uL (ref 4.22–5.81)
RDW: 13 % (ref 11.5–15.5)
WBC: 9.3 10*3/uL (ref 4.0–10.5)

## 2015-10-30 LAB — RAPID URINE DRUG SCREEN, HOSP PERFORMED
Amphetamines: NOT DETECTED
BENZODIAZEPINES: NOT DETECTED
Barbiturates: NOT DETECTED
COCAINE: NOT DETECTED
OPIATES: NOT DETECTED
Tetrahydrocannabinol: NOT DETECTED

## 2015-10-30 LAB — SALICYLATE LEVEL

## 2015-10-30 LAB — ACETAMINOPHEN LEVEL

## 2015-10-30 LAB — ETHANOL

## 2015-10-30 NOTE — BH Assessment (Addendum)
Assessment Note  Brad Miller is an 64 y.o. male who presented to [facility] as a walk in with his wife at the request of his medical doctor.  Patient's wife discussed patient history of depression for at least 5 years and being treated with psychotropic medications by his primary doctor.  She reported that patient had been driving back and forth to [place] for years for work related activities and family visits.  She reported that on November 1st patient was driving back from [place] and got lost in [place] needing his brother to come and get him.  Patient''s wife reported that patient has had behaviors that are unlike him since then and that he appears "confused and can not make decisions.  She reported that patient talked non stop on Sunday and would just repeat himself over and over again.  She stated that the next day patient stopped talking altogether and would often just look at her for the answer to questions she was directing at him.  Patient's wife stated that patient was unable to complete his walk up the stairs last night and she found him with the lights off, his hands in his pocket midway up the stairs.  She reported that she had to give him play by play directions in order for him to continue moving up the stairs and into the bedroom.  Patient's wife also reported that patient was unable to put his CPAP machine on even though he has been using it for years.    Patient was unable to answer most questions directed at him.  He would sometimes shake his head yes or no and would look at his wife when questions were directed at him sometimes laughing as he did so.  He was oriented x 4 but could not answer questions like how many hours sleep he was getting and stated "it should be fairly obvious that I can't make decisions."  He denied suicidal ideations, past suicide attempts, homicidal ideations, aggression, hallucinations, drug and alcohol use.  Patient did endorse feelings of sadness and his wife  stated that he has been tearful at times.  Patient's wife also reported that patient prior to patient having difficulty with communication he had been talking about his depression to anybody he would meet and telling them that he was "having an emotional problem."  Consulted with NP May who recommended patient go to the [facility] to be medically cleared and then follow up with outpatient services.  Outpatient referrals will be provided to patient and his wife.  If after being medically cleared patient reports anything that meets criteria for a higher level of care in patient services could be recommended at that time.         Diagnosis: 296.32 Major depressive disorder, Recurrent episode, Moderate                     Rule out dementia related disorders  Past Medical History:  Past Medical History  Diagnosis Date  . Hyperlipidemia     TAKES LOVASTATIN NIGHTLY  . Depression     TAKES WELLBUTRIN DAILY  . Anxiety     TAKES XANAX DAILY AS NEEDED  . Hypertension     TAKES CARDURA DAILY  . Sleep apnea     USES CPAP  . Headache(784.0)     OCCASIONALLY  . Arthritis   . Joint pain   . Urinary frequency   . Urinary urgency   . Enlarged prostate   . Diabetes  mellitus without complication     BORDERLINE  . Cataracts, bilateral   . Rash     AROUND NOSE AND STATES D/T NOT USING LOTION WHILE SHAVING    Past Surgical History  Procedure Laterality Date  . 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 Total knee arthroplasty Right 02/23/2014    Procedure: TOTAL KNEE ARTHROPLASTY;  Surgeon: Kerin Salen, MD;  Location: [place];  Service: Orthopedics;  Laterality: Right;  . Knee arthroscopy Left 02/23/2014    Procedure: LEFT ARTHROSCOPY KNEE;  Surgeon: Kerin Salen, MD;  Location: [place];  Service: Orthopedics;  Laterality: Left;    Family History:  Family History  Problem Relation Age of Onset  . Hypertension Mother   . Heart attack Mother   . Osteoporosis Mother   . CVA Mother   . Coronary artery  disease Mother   . Hypertension Father   . Emphysema Father   . Coronary artery disease Brother   . Diabetes Brother     Social History:  reports that he has never smoked. He does not have any smokeless tobacco history on file. He reports that he drinks alcohol. He reports that he does not use illicit drugs.  Additional Social History:  Alcohol / Drug Use Pain Medications:  (none reported) Prescriptions:  (trazodone 100 mg at bedtime wellbutin 150 Paxit 40 mg klonopin 1mg  daily) History of alcohol / drug use?: No history of alcohol / drug abuse  CIWA:   COWS:    Allergies:  Allergies  Allergen Reactions  . Ciprofloxacin     GI side effects   . Hydrocodone-Acetaminophen     Itch/constipation  . Zocor [Simvastatin]     Back aches     Home Medications:  (Not in a hospital admission)  OB/GYN Status:  No LMP for male patient.  General Assessment Data Location of Assessment: [facility] Assessment Services TTS Assessment: In system Is this a Tele or Face-to-Face Assessment?: Face-to-Face Is this an Initial Assessment or a Re-assessment for this encounter?: Initial Assessment Marital status: Married [place] name:  (n/a) Is patient pregnant?: No Pregnancy Status: No Living Arrangements: Spouse/significant other Can pt return to current living arrangement?: Yes Admission Status: Other (Comment) (not being admitted) Is patient capable of signing voluntary admission?: Yes Referral Source: MD (Dr. Percell Miller) Insurance type:  [facility])  Medical Screening Exam ([facility] [facility]) Medical Exam completed: No Reason for MSE not completed: Other: (pt is in route to [facility] for clearance)  Crisis Care Plan Living Arrangements: Spouse/significant other Name of Psychiatrist:  (none) Name of Therapist:  (none)  Education Status Is patient currently in school?: No Current Grade:  (n/a) Highest grade of school patient has completed:  (college) Name of school:  (n/a) Contact person:   (n/a)  Risk to self with the past 6 months Suicidal Ideation: No Has patient been a risk to self within the past 6 months prior to admission? : No Suicidal Intent: No Has patient had any suicidal intent within the past 6 months prior to admission? : No Is patient at risk for suicide?: No Suicidal Plan?: No Has patient had any suicidal plan within the past 6 months prior to admission? : No Access to Means: No What has been your use of drugs/alcohol within the last 12 months?:  (none) Previous Attempts/Gestures: No How many times?:  (0) Other Self Harm Risks:  (none) Triggers for Past Attempts:  (no attempts) Intentional Self Injurious Behavior: None Family Suicide History: No Recent stressful  life event(s): Other (Comment) (mother was put into nursing home) Persecutory voices/beliefs?: No Depression: Yes Depression Symptoms: Despondent, Tearfulness, Isolating, Loss of interest in usual pleasures, Feeling worthless/self pity, Feeling angry/irritable Substance abuse history and/or treatment for substance abuse?: No Suicide prevention information given to non-admitted patients: Yes  Risk to Others within the past 6 months Homicidal Ideation: No Does patient have any lifetime risk of violence toward others beyond the six months prior to admission? : No Thoughts of Harm to Others: No Current Homicidal Intent: No Current Homicidal Plan: No Access to Homicidal Means: No Identified Victim:  (none) History of harm to others?: No Assessment of Violence: None Noted Violent Behavior Description:  (none) Does patient have access to weapons?: No Criminal Charges Pending?: No Does patient have a court date: No Is patient on probation?: No  Psychosis Hallucinations: None noted Delusions: None noted  Mental Status Report Appearance/Hygiene: Unremarkable Eye Contact: Poor Motor Activity: Freedom of movement Speech: Unable to assess (pt could not answer questions/ confused or unable  ???) Level of Consciousness: Quiet/awake Mood: Preoccupied Affect: Inconsistent with thought content Anxiety Level: Minimal Thought Processes: Irrelevant Judgement: Impaired Orientation: Person, Place, Time, Situation Obsessive Compulsive Thoughts/Behaviors: None  Cognitive Functioning Concentration: Decreased Memory: Recent Impaired, Remote Impaired IQ: Average Insight: Poor Impulse Control: Poor Appetite: Good Weight Loss:  (none) Weight Gain:  (none) Sleep: No Change Total Hours of Sleep:  (8) Vegetative Symptoms: None  ADLScreening [facility] Assessment Services) Patient's cognitive ability adequate to safely complete daily activities?: No Patient able to express need for assistance with ADLs?: No Independently performs ADLs?: Yes (appropriate for developmental age)  Prior Inpatient Therapy Prior Inpatient Therapy: Yes Prior Therapy Dates:  (25 years ago) Prior Therapy Facilty/Provider(s):  Psychologist, counselling) Reason for Treatment:  (depression)  Prior Outpatient Therapy Prior Outpatient Therapy: No Prior Therapy Dates:  (n/a) Prior Therapy Facilty/Provider(s):  (n/a) Reason for Treatment:  (n/a) Does patient have an ACCT team?: No Does patient have Intensive In-House Services?  : No Does patient have [facility] services? : No Does patient have P4CC services?: No  ADL Screening (condition at time of admission) Patient's cognitive ability adequate to safely complete daily activities?: No Is the patient deaf or have difficulty hearing?: No Does the patient have difficulty seeing, even when wearing glasses/contacts?: No Does the patient have difficulty concentrating, remembering, or making decisions?: Yes Patient able to express need for assistance with ADLs?: No Does the patient have difficulty dressing or bathing?: No Independently performs ADLs?: Yes (appropriate for developmental age) Does the patient have difficulty walking or climbing stairs?: Yes (stopped midway up stairs  could not figure out how to keep going up) Weakness of Legs: None Weakness of Arms/Hands: None  Home Assistive Devices/Equipment Home Assistive Devices/Equipment: None  Therapy Consults (therapy consults require a physician order) PT Evaluation Needed: No OT Evalulation Needed: No SLP Evaluation Needed: No Abuse/Neglect Assessment (Assessment to be complete while patient is alone) Physical Abuse: Denies Verbal Abuse: Denies Sexual Abuse: Denies Exploitation of patient/patient's resources: Denies Self-Neglect: Denies     Regulatory affairs officer (For Healthcare) Does patient have an advance directive?: No Would patient like information on creating an advanced directive?: No - patient declined information    Additional Information 1:1 In Past 12 Months?: No CIRT Risk: No Elopement Risk: No Does patient have medical clearance?: No     Disposition:  Disposition Initial Assessment Completed for this Encounter: Yes Disposition of Patient: Other dispositions Other disposition(s): Other (Comment) (pt referred to ED for med clearance then  outpt resources)  On Site Evaluation by:   Reviewed with Physician:    Carlean Jews 10/30/2015 4:11 PM

## 2015-10-30 NOTE — ED Notes (Signed)
Pt sent over from [facility], to assess if there is an organic cause for pts change and decrease in speaking.   rn spoke with wife privately. Wife reports pt travels back and forth to [place] several times a year for work. Pt the week of halloween started "confessing sins" while in [place]. Pt drove back to [place] on 11/1, when pt got to [facility], pt got lost, and wife had to send pts son to go find him and drive him home. Pt since being back and has been talking about the same issue over and over. Pt started confessing sins to his wife. Pt went to pcp Thursday 11/3, pcp wanted wife to get pt into see a counselor for depression. Wife has had difficulty finding a counselor due to insurance. On Sunday 11/6 pt started not talking. Pt completely stopped talking yesterday, when wife would force pt to talk he would shout "NO". Wife reports this is not typical of pt and he has never yelled at her before. Last night pt had difficulty trying to work cpap machine and stood at the stairs for over 10 minutes when he was suppose to be coming to bed.   When rn told pt he had to talk today, pt stated "I'm having difficulty, difficulty, difficulty" and said nothing else. Pt just nods yes or no to answer questions. Pt nodded no, and denied SI/HI, AH/VH. Denies pain.

## 2015-10-30 NOTE — ED Notes (Signed)
TTS provided Pt's wife w/ psychiatry resources.

## 2015-10-30 NOTE — ED Notes (Signed)
Pt is able to tell me his name and the day of the week but is unable to report why he is here or where he is actually at. Pt has clear speech and strong bilateral hand grips. Facial symmetry noted, no droop noted.

## 2015-10-30 NOTE — ED Notes (Signed)
Pt sent by [facility] for evaluation.  Sts they feel that "something organic is going on, like a stroke or something."  Sts Pt is having increasing difficulty speaking and answering questions x 1 week.  Sts "the Pt keeps looking to his wife to answer questions."  TTS reports that they are going to try to set him up w/ a psychiatrist.

## 2015-10-30 NOTE — Discharge Instructions (Signed)
°Emergency Department Resource Guide °1) Find a Doctor and Pay Out of Pocket °Although you won't have to find out who is covered by your insurance plan, it is a good idea to ask around and get recommendations. You will then need to call the office and see if the doctor you have chosen will accept you as a new patient and what types of options they offer for patients who are self-pay. Some doctors offer discounts or will set up payment plans for their patients who do not have insurance, but you will need to ask so you aren't surprised when you get to your appointment. ° °2) Contact Your Local Health Department °Not all health departments have doctors that can see patients for sick visits, but many do, so it is worth a call to see if yours does. If you don't know where your local health department is, you can check in your phone book. The CDC also has a tool to help you locate your state's health department, and many state websites also have listings of all of their local health departments. ° °3) Find a Walk-in Clinic °If your illness is not likely to be very severe or complicated, you may want to try a walk in clinic. These are popping up all over the country in pharmacies, drugstores, and shopping centers. They're usually staffed by nurse practitioners or physician assistants that have been trained to treat common illnesses and complaints. They're usually fairly quick and inexpensive. However, if you have serious medical issues or chronic medical problems, these are probably not your best option. ° °No Primary Care Doctor: °- Call [facility] at  832-8000 - they can help you locate a primary care doctor that  accepts your insurance, provides certain services, etc. °- Physician Referral Service- 1-800-533-3463 ° °Chronic Pain Problems: °Organization         Address  Phone   Notes  °[facility]  (336) 297-2271 Patients need to be referred by their primary care doctor.  ° °Medication  Assistance: °Organization         Address  Phone   Notes  °[place] [facility] [address] °[place], [place] [address] (336) 641-8030 --Must be a resident of [place] °-- Must have NO insurance coverage whatsoever (no Medicaid/ Medicare, etc.) °-- The pt. MUST have a primary care doctor that directs their care regularly and follows them in the community °  °[facility]  (866) 331-1348   °[employer]  (888) 892-1162   ° °Agencies that provide inexpensive medical care: °Organization         Address  Phone   Notes  °[facility]  (336) 832-8035   °[facility]    (336) 832-7272   °[facility] [address] °[place], [place] [address] (336) 832-4777   °[facility] [address], °[place] (336) 271-4999   °[facility]    (336) 373-0678   °[facility]    (336) 272-1050   °[facility] ° [address], [place] Phone:  (336) 832-4444, Fax:  (336) 832-4440 Hours of Operation:  9 am - 6 pm, M-F.  Also accepts Medicaid/Medicare and self-pay.  °[facility] ° [address], [place] Phone: (336) 832-3150, Fax: (336) 832-3151. Hours of Operation:  8:30 am - 5:30 pm, M-F.  Also accepts Medicaid and self-pay.  °[facility] [place] [address], [place] Phone: (336) 878-6027   °[facility] [address], [place], [place] (336)723-1848, Ext. 123 Mondays & Thursdays: 7-9 AM.  First 15 patients are seen on a first come, first serve basis. °  ° °Medicaid-accepting [place] Providers: ° °Organization         Address  Phone   Notes  °[facility] [address], [place] (336) 641-2100 Also accepts self-pay patients.  °[facility] [address], [place] ° (336) 856-9996   °[facility] [address], [place]  (336) 288-8857   °[facility] [address] [place] [address], [place] (336) 299-7000   °Veita Bland [address], [place]  ° (336) 373-1557 Only accepts [place] Access Medicaid patients after they have their name applied to their card.  ° °Self-Pay (no insurance) in [place]: ° °Organization         Address  Phone   Notes  °Sickle Cell Patients, [facility] [address], [place] (336) 832-1970   °[facility] [address], [place] (336) 832-4400   °[facility] [place] ° [address], [place] (336) 992-4800   °[facility]/Dr. Osei-Bonsu ° [address] [place] [address], [place] or [address], [place] (336) 841-8500 Phone number for both [place] and [place] locations is the same.  °[facility] [address], [place] (336) 299-0000   °[facility] [address] [place] [address], [place] or [address] (336) 852-7530 °(336) 878-2260   °[facility] [address], [place] (336) 350-1642, phone; (336) 294-5005, fax Sees patients 1st and 3rd Saturday of every month.  Must not qualify for public or private insurance (i.e. Medicaid, Medicare, [place] Health Choice, Veterans' Benefits) • Household income should be no more than 200% of the poverty level •The clinic cannot treat you if you are pregnant or think you are pregnant • Sexually transmitted diseases are not treated at the clinic.  ° ° °Dental Care: °Organization         Address  Phone  Notes  °[place] [facility] [address],  (336) 641-6152 Accepts children up to age 21 who are enrolled in Medicaid or [place] Health Choice; pregnant women with a Medicaid card; and children who have applied for Medicaid or [place] Health Choice, but were declined, whose parents can pay a reduced fee at time of service.  °[place]  [facility] [place]  [address], [place] (336) 641-7733 Accepts children up to age 21 who are enrolled in Medicaid or  Health Choice; pregnant women with a Medicaid card; and children who have applied for Medicaid or  Health Choice, but were declined, whose parents can pay a reduced fee at time of service.  °[facility] ° [address],  (336) 641-4533 Patients are seen by appointment only. Walk-ins are not accepted. [facility] will see patients 18 years of age and older. °Monday - Tuesday (8am-5pm) °Most Wednesdays (8:30-5pm) °$30 per visit, cash only  °[facility] ° [address], [place] (336) 641-4533 Patients are seen by appointment only. Walk-ins are not accepted. [facility] will see patients 18 years of age and older. °One   Wednesday Evening (Monthly: Volunteer Based).  $30 per visit, cash only  °[facility]  (919) 537-3737 for adults; Children under age 4, call Graduate Pediatric Dentistry at (919) [phone]. Children aged 4-14, please call (919) [phone] to request a pediatric application. ° Dental services are provided in all areas of dental care including fillings, crowns and bridges, complete and partial dentures, implants, gum treatment, root canals, and extractions. Preventive care is also provided. Treatment is provided to both adults and children. °Patients are selected via a lottery and there is often a waiting list. °  °[facility] [address], °[place] ° (336) 763-8833 [email or web address] °  °[facility] [address], [place], [place] (336)723-1848, Ext. 123 Second and Fourth Thursday of each month, opens at 6:30 AM; Clinic ends at 9 AM.  Patients are seen on a first-come first-served basis, and a limited number are seen during each clinic.  ° °[facility] ° [address], [place], [place] (336) 723-7904    Eligibility Requirements °You must have lived in [place], [place], or [place] counties for at least the last three months. °  You cannot be eligible for state or federal sponsored healthcare insurance, including Veterans Administration, Medicaid, or Medicare. °  You generally cannot be eligible for healthcare insurance through your employer.  °  How to apply: °Eligibility screenings are held every Tuesday and Wednesday afternoon from 1:00 pm until 4:00 pm. You do not need an appointment for the interview!  °[facility] [address], [place], [place] 336-631-2330   °[facility]  336-342-8273   °[facility]  336-703-3100   °[facility]  336-570-6415   ° °Behavioral Health Resources in the Community: °Intensive Outpatient Programs °Organization         Address  Phone  Notes  °[facility] [address], [place], [place] 336-878-6098   °[facility] [address], [place], [place] 336-832-9800   °[facility] [address], [place], [place] ° 336-882-2125   °[facility] [address],  °[place], [place] 1-800-853-5163 or 336-641-4981   °Substance Abuse Resources °Organization         Address  Phone  Notes  °[facility]  336-882-2125   °[facility]  336-784-9470   °[facility]  336-285-9073   °[facility]  336-845-3988   °Residential & Outpatient Substance Abuse Program  1-800-659-3381   °Psychological Services °Organization         Address  Phone  Notes  °[facility]  336- 832-9600   °[facility]  336- 378-7881   °[facility] [address], [place] 1-800-853-5163 or 336-641-4981   ° °Mobile Crisis Teams °Organization         Address  Phone  Notes  °[facility], Mobile Crisis Care Unit  1-877-626-1772   °Assertive °[facility] ° [address]  [place], [place] 336-834-9664   °Sharon DeEsch [address] °[place] 336-554-5454   ° °Self-Help/Support Groups °Organization         Address  Phone             Notes  °[facility] - variety of support groups  336- 373-1402 Call for more information  °Narcotics Anonymous (NA), [facility] [address], °[place]  2 meetings at this location  ° °  Residential Treatment Programs °Organization         Address  Phone  Notes  °[facility] [address],    °[place]  1-866-801-8205   °[facility] ° [address], [place], [place] 704-293-8524   °[facility] [address], [place] 336-845-3988 Admissions: 8am-3pm M-F  °[facility] [address]    °[place], [place] 336-841-1104   °[facility] [address], [place], [place] 336-379-7146   °[facility] [address]  °[place], [place] 336-285-9073   °[facility] - Intensive Outpatient [address], [place], [place] 336-852-3033   °[facility] [address]  °[place], [place] 1-877-615-2722 or 336-784-9470   °[facility] [address] [place], [place] 336-227-7417 Accepts Medicaid  °[facility] [address]  °[place] 1-800-659-3381 Substance Abuse/Addiction Treatment  ° °[place] Behavioral Health Resources °Organization         Address  Phone  Notes  °[facility]  (888) 581-9988   °Julie Brannon, PhD [address] [place], [place]   (336) 349-5553 or (336) 951-0000   °[facility]   [address] °[place], [place] (336) 349-4454   °[facility] [address], [place], [place] (336) 342-8316 Insurance/Medicaid/sponsorship through [facility]  °[facility] [address]                                    [place], [place] (336) 342-8316 Therapy/tele-psych/case    °[facility] [address]  ° [place], [place] (336) 349-2233    °Dr. Arfeen  (336) 349-4544   °[facility] [place]  [employer] [place] [facility] 1) [address], [place] °2) [address], [place] °3)  [address], [place] (336) 349-3220 °(336) 342-7768 ° °(336) 342-8140   °[place] [facility] (336) 342-1394 or (336) 342-3537 (After Hours)    ° ° °

## 2015-10-30 NOTE — ED Provider Notes (Signed)
CSN: [id number]     Arrival date & time 10/30/15  1608 History   First MD Initiated Contact with Patient 10/30/15 1736     Chief Complaint  Patient presents with  . Medical Clearance, Personality Change      (Consider location/radiation/quality/duration/timing/severity/associated sxs/prior Treatment) HPI Comments: Patient goes back and forth to [place] for work several times a year, and the week of halloween started "confessing sins" while in [place] and when he returned he started confessnig sins to his wife.  Patient acknowledges depression (nods) Wife reports his depressed symptoms appeared to be worsening over the last week and patient stopped talking for last 2 days.  Reports he seems confused at times, needing help placing his CPAP and getting lost on the way back from [place] once he got to [place].  He has seemed agitated.  When she made him talk he would yell "NO", Behaving abnormally, not going to bed, standing in hallway.  Sunday, started getting quieter Yesterday he stopped talking Repeating same words, difficulty talking Repeating "I need to make a decision" Saw PCP who recommended going to [facility] evaluated and sent patient here 25 years ago was admitted to mental health for depression Has seemed depressed Not talking for 2 days    Past Medical History  Diagnosis Date  . Hyperlipidemia     TAKES LOVASTATIN NIGHTLY  . Depression     TAKES WELLBUTRIN DAILY  . Anxiety     TAKES XANAX DAILY AS NEEDED  . Hypertension     TAKES CARDURA DAILY  . Sleep apnea     USES CPAP  . Headache(784.0)     OCCASIONALLY  . Arthritis   . Joint pain   . Urinary frequency   . Urinary urgency   . Enlarged prostate   . Diabetes mellitus without complication     BORDERLINE  . Cataracts, bilateral   . Rash     AROUND NOSE AND STATES D/T NOT USING LOTION WHILE SHAVING   Past Surgical History  Procedure Laterality Date  . 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 Total knee  arthroplasty Right 02/23/2014    Procedure: TOTAL KNEE ARTHROPLASTY;  Surgeon: Kerin Salen, MD;  Location: [facility];  Service: Orthopedics;  Laterality: Right;  . Knee arthroscopy Left 02/23/2014    Procedure: LEFT ARTHROSCOPY KNEE;  Surgeon: Kerin Salen, MD;  Location: [facility];  Service: Orthopedics;  Laterality: Left;   Family History  Problem Relation Age of Onset  . Hypertension Mother   . Heart attack Mother   . Osteoporosis Mother   . CVA Mother   . Coronary artery disease Mother   . Hypertension Father   . Emphysema Father   . Coronary artery disease Brother   . Diabetes Brother    Social History  Substance Use Topics  . Smoking status: Never Smoker   . Smokeless tobacco: Not on file  . Alcohol Use: Yes     Comment: OCCASIONALLY    Review of Systems  Constitutional: Negative for fever.  HENT: Negative for sore throat.   Eyes: Negative for visual disturbance.  Respiratory: Negative for shortness of breath.   Cardiovascular: Negative for chest pain.  Gastrointestinal: Negative for nausea, vomiting, abdominal pain and diarrhea.  Genitourinary: Negative for difficulty urinating.  Musculoskeletal: Positive for gait problem. Negative for back pain and neck stiffness.  Skin: Negative for rash.  Neurological: Positive for speech difficulty. Negative for syncope, facial asymmetry, weakness, light-headedness, numbness and headaches.  Allergies  Ciprofloxacin; Hydrocodone-acetaminophen; Penicillins; and Zocor  Home Medications   Prior to Admission medications   Medication Sig Start Date End Date Taking? Authorizing Provider  acetaminophen (TYLENOL) 500 MG tablet Take 1,000 mg by mouth every 6 (six) hours as needed.   Yes Historical Provider, MD  buPROPion (WELLBUTRIN SR) 150 MG 12 hr tablet Take 150 mg by mouth 2 (two) times daily.   Yes Historical Provider, MD  Butalbital-APAP-Caffeine (FIORICET PO) Take 1-2 tablets by mouth every 4 (four) hours as needed (for headache).     Yes Historical Provider, MD  clonazePAM (KLONOPIN) 1 MG tablet Take 1 mg by mouth 2 (two) times daily as needed for anxiety.   Yes Historical Provider, MD  doxazosin (CARDURA) 8 MG tablet Take 4 mg by mouth daily.   Yes Historical Provider, MD  ibuprofen (ADVIL,MOTRIN) 800 MG tablet Take 800 mg by mouth every 8 (eight) hours as needed for moderate pain.    Yes Historical Provider, MD  Omega-3 Fatty Acids (FISH OIL PO) Take 4 capsules by mouth every morning.   Yes Historical Provider, MD  PARoxetine (PAXIL) 10 MG tablet Take 1 tablet by mouth daily. 10/25/15  Yes Historical Provider, MD  PARoxetine (PAXIL) 40 MG tablet Take 40 mg by mouth every morning.   Yes Historical Provider, MD  POTASSIUM PO Take 1 tablet by mouth daily.   Yes Historical Provider, MD  pyridOXINE (VITAMIN B-6) 50 MG tablet Take 50 mg by mouth every other day.    Yes Historical Provider, MD  sildenafil (VIAGRA) 50 MG tablet Take 25-50 mg by mouth daily as needed for erectile dysfunction.   Yes Historical Provider, MD  testosterone cypionate (DEPOTESTOTERONE CYPIONATE) 200 MG/ML injection Inject 200 mg into the muscle every 28 (twenty-eight) days.   Yes Historical Provider, MD  traZODone (DESYREL) 100 MG tablet Take 100 mg by mouth at bedtime.   Yes Historical Provider, MD  TURMERIC PO Take 1 capsule by mouth daily.   Yes Historical Provider, MD  vitamin B-12 (CYANOCOBALAMIN) 1000 MCG tablet Take 2,000 mcg by mouth daily.   Yes Historical Provider, MD  aspirin EC 325 MG tablet Take 1 tablet (325 mg total) by mouth 2 (two) times daily. Patient not taking: Reported on 10/30/2015 02/23/14   Leighton Parody, PA-C  methocarbamol (ROBAXIN) 500 MG tablet Take 1 tablet (500 mg total) by mouth 2 (two) times daily with a meal. Patient not taking: Reported on 10/30/2015 02/23/14   Leighton Parody, PA-C  oxyCODONE-acetaminophen (ROXICET) 5-325 MG per tablet Take 1 tablet by mouth every 4 (four) hours as needed. Patient not taking: Reported on  10/30/2015 02/23/14   Leighton Parody, PA-C   BP 143/67 mmHg  Pulse 82  Temp(Src) 98.5 F (36.9 C) (Oral)  Resp 16  SpO2 100% Physical Exam  Constitutional: He is oriented to person, place, and time. He appears well-developed and well-nourished. No distress.  HENT:  Head: Normocephalic and atraumatic.  Eyes: Conjunctivae and EOM are normal.  Neck: Normal range of motion.  Cardiovascular: Normal rate, regular rhythm, normal heart sounds and intact distal pulses.  Exam reveals no gallop and no friction rub.   No murmur heard. Pulmonary/Chest: Effort normal and breath sounds normal. No respiratory distress. He has no wheezes. He has no rales.  Abdominal: Soft. He exhibits no distension. There is no tenderness. There is no guarding.  Musculoskeletal: He exhibits no edema.  Neurological: He is alert and oriented to person, place, and time. He has  normal strength. No cranial nerve deficit or sensory deficit. He displays a negative Romberg sign. Coordination and gait normal. GCS eye subscore is 4. GCS motor subscore is 6.  Patient not answering questions, however alert, following commands briskly. At times will speak and voice does not sound dysarthric, repeating words however not clear if having word finding difficulty  Skin: Skin is warm and dry. He is not diaphoretic.  Nursing note and vitals reviewed.   ED Course  Procedures (including critical care time) Labs Review Labs Reviewed  COMPREHENSIVE METABOLIC PANEL - Abnormal; Notable for the following:    Glucose, Bld 109 (*)    All other components within normal limits  ACETAMINOPHEN LEVEL - Abnormal; Notable for the following:    Acetaminophen (Tylenol), Serum <10 (*)    All other components within normal limits  URINALYSIS, ROUTINE W REFLEX MICROSCOPIC (NOT AT [facility]) - Abnormal; Notable for the following:    Color, Urine AMBER (*)    Bilirubin Urine SMALL (*)    All other components within normal limits  ETHANOL  SALICYLATE LEVEL   CBC  URINE RAPID DRUG SCREEN, HOSP PERFORMED    Imaging Review Mr Brain Wo Contrast  10/30/2015  CLINICAL DATA:  Increasing difficulty speaking and answer questions over the last week. EXAM: MRI HEAD WITHOUT CONTRAST TECHNIQUE: Multiplanar, multiecho pulse sequences of the brain and surrounding structures were obtained without intravenous contrast. COMPARISON:  None. FINDINGS: The study is severely degraded by patient motion. No acute infarct, hemorrhage, or mass lesion is present. Mild periventricular and subcortical T2 changes are noted. These extend into the brainstem. Ventricles are of normal size. No definite extra-axial fluid collection is present. Flow is present in the major intracranial arteries. Bilateral lens replacements are present. The paranasal sinuses mastoid air cells are clear. IMPRESSION: 1. No acute intracranial abnormality. 2. The study is severely degraded by patient motion. Electronically Signed   By: San Morelle M.D.   On: 10/30/2015 19:32   I have personally reviewed and evaluated these images and lab results as part of my medical decision-making.   EKG Interpretation   Date/Time:  Wednesday October 30 2015 17:57:59 EST Ventricular Rate:  82 PR Interval:  168 QRS Duration: 99 QT Interval:  386 QTC Calculation: 451 R Axis:   25 Text Interpretation:  Sinus rhythm Baseline wander in lead(s) V6 No  significant change since last tracing Confirmed by [facility] MD, Abbey Veith  ([id number]) on 10/30/2015 11:38:09 PM      MDM   Final diagnoses:  Speech disturbance in adult  Depression  Cognitive and behavioral changes   64 year old male with a history of hyperlipidemia, hypertension, diabetes, depression and anxiety presents with concern of depression, behavior changes including patient not speaking for the past 2 days.  Patient's neurologic exam is normal with the exception that patient will not speak.  MR brain ordered and showed no acute intracranial abnormality.   Labs show no leukocytosis, neurologic abnormalities, no sign of urinary tract infection, or sign of ingestion.  Doubt delirium or infectious etiology of patient's symptoms. Normal VS, normal labs.    Possible depression, vs other psychiatric condition, vs rapid dementia or other.  Pt is appropriate for outpt evaluation for other possible organic causes versus psychiatric etiology. History of depression "confessing sins", with worsening followed by alteration in speech and behavior change.  TTS consulted and recommend outpatient follow up.  Family states understanding.  Patient discharged in stable condition with understanding of reasons to return.   Gareth Morgan,  MD 10/31/15 0020

## 2015-10-31 ENCOUNTER — Encounter (HOSPITAL_COMMUNITY): Payer: Self-pay | Admitting: *Deleted

## 2015-10-31 ENCOUNTER — Emergency Department (HOSPITAL_COMMUNITY)
Admission: EM | Admit: 2015-10-31 | Discharge: 2015-11-01 | Disposition: A | Discharge status: Other Acute Inpatient Hospital | Payer: Commercial Managed Care - HMO | Attending: Emergency Medicine | Admitting: Emergency Medicine

## 2015-10-31 ENCOUNTER — Ambulatory Visit (HOSPITAL_COMMUNITY)
Admission: RE | Admit: 2015-10-31 | Discharge: 2015-10-31 | Disposition: A | Discharge status: Home / Self Care | Payer: Commercial Managed Care - HMO | Attending: Psychiatry | Admitting: Psychiatry

## 2015-10-31 DIAGNOSIS — M1990 Unspecified osteoarthritis, unspecified site: Secondary | ICD-10-CM | POA: Insufficient documentation

## 2015-10-31 DIAGNOSIS — H269 Unspecified cataract: Secondary | ICD-10-CM | POA: Insufficient documentation

## 2015-10-31 DIAGNOSIS — I10 Essential (primary) hypertension: Secondary | ICD-10-CM | POA: Insufficient documentation

## 2015-10-31 DIAGNOSIS — F05 Delirium due to known physiological condition: Secondary | ICD-10-CM | POA: Diagnosis not present

## 2015-10-31 DIAGNOSIS — R461 Bizarre personal appearance: Secondary | ICD-10-CM | POA: Diagnosis present

## 2015-10-31 DIAGNOSIS — R410 Disorientation, unspecified: Secondary | ICD-10-CM | POA: Diagnosis not present

## 2015-10-31 DIAGNOSIS — Z79899 Other long term (current) drug therapy: Secondary | ICD-10-CM | POA: Insufficient documentation

## 2015-10-31 DIAGNOSIS — E785 Hyperlipidemia, unspecified: Secondary | ICD-10-CM | POA: Diagnosis not present

## 2015-10-31 DIAGNOSIS — F332 Major depressive disorder, recurrent severe without psychotic features: Secondary | ICD-10-CM | POA: Diagnosis not present

## 2015-10-31 DIAGNOSIS — R4701 Aphasia: Secondary | ICD-10-CM | POA: Diagnosis not present

## 2015-10-31 DIAGNOSIS — F329 Major depressive disorder, single episode, unspecified: Secondary | ICD-10-CM

## 2015-10-31 DIAGNOSIS — G4730 Sleep apnea, unspecified: Secondary | ICD-10-CM | POA: Insufficient documentation

## 2015-10-31 DIAGNOSIS — F419 Anxiety disorder, unspecified: Secondary | ICD-10-CM | POA: Insufficient documentation

## 2015-10-31 DIAGNOSIS — Z880 Allergy status to penicillin: Secondary | ICD-10-CM | POA: Diagnosis not present

## 2015-10-31 LAB — CBC WITH DIFFERENTIAL/PLATELET
Basophils Absolute: 0 10*3/uL (ref 0.0–0.1)
Basophils Relative: 0 %
EOS ABS: 0.1 10*3/uL (ref 0.0–0.7)
Eosinophils Relative: 1 %
HCT: 45.4 % (ref 39.0–52.0)
HEMOGLOBIN: 15.3 g/dL (ref 13.0–17.0)
LYMPHS ABS: 1.5 10*3/uL (ref 0.7–4.0)
Lymphocytes Relative: 19 %
MCH: 30.4 pg (ref 26.0–34.0)
MCHC: 33.7 g/dL (ref 30.0–36.0)
MCV: 90.1 fL (ref 78.0–100.0)
Monocytes Absolute: 0.6 10*3/uL (ref 0.1–1.0)
Monocytes Relative: 7 %
NEUTROS PCT: 73 %
Neutro Abs: 5.8 10*3/uL (ref 1.7–7.7)
Platelets: 256 10*3/uL (ref 150–400)
RBC: 5.04 MIL/uL (ref 4.22–5.81)
RDW: 13.1 % (ref 11.5–15.5)
WBC: 8 10*3/uL (ref 4.0–10.5)

## 2015-10-31 LAB — COMPREHENSIVE METABOLIC PANEL
ALK PHOS: 70 U/L (ref 38–126)
ALT: 39 U/L (ref 17–63)
ANION GAP: 12 (ref 5–15)
AST: 15 U/L (ref 15–41)
Albumin: 3.8 g/dL (ref 3.5–5.0)
BUN: 12 mg/dL (ref 6–20)
CALCIUM: 9.7 mg/dL (ref 8.9–10.3)
CHLORIDE: 101 mmol/L (ref 101–111)
CO2: 24 mmol/L (ref 22–32)
Creatinine, Ser: 0.94 mg/dL (ref 0.61–1.24)
GFR calc non Af Amer: >60 mL/min (ref >60)
Glucose, Bld: 103 mg/dL — ABNORMAL HIGH (ref 65–99)
POTASSIUM: 4 mmol/L (ref 3.5–5.1)
SODIUM: 137 mmol/L (ref 135–145)
Total Bilirubin: 0.9 mg/dL (ref 0.3–1.2)
Total Protein: 7 g/dL (ref 6.5–8.1)

## 2015-10-31 LAB — ETHANOL: Alcohol, Ethyl (B): <5 mg/dL (ref <5)

## 2015-10-31 MED ORDER — CLONAZEPAM 0.5 MG PO TABS
1.0000 mg | ORAL_TABLET | Freq: Two times a day (BID) | ORAL | Status: DC | PRN
  Administered 2015-11-01 (×2): 1 mg via ORAL
  Filled 2015-10-31 (×3): qty 2

## 2015-10-31 MED ORDER — BUPROPION HCL ER (SR) 150 MG PO TB12
150.0000 mg | ORAL_TABLET | Freq: Two times a day (BID) | ORAL | Status: DC
  Administered 2015-11-01: 150 mg via ORAL
  Filled 2015-10-31 (×3): qty 1

## 2015-10-31 MED ORDER — ACETAMINOPHEN 500 MG PO TABS
1000.0000 mg | ORAL_TABLET | Freq: Four times a day (QID) | ORAL | Status: DC | PRN
Start: 2015-10-31 — End: 2015-11-01

## 2015-10-31 MED ORDER — ACETAMINOPHEN 325 MG PO TABS: 650.0000 mg | ORAL_TABLET | ORAL | Status: DC | PRN

## 2015-10-31 MED ORDER — TRAZODONE HCL 100 MG PO TABS
100.0000 mg | ORAL_TABLET | Freq: Every day | ORAL | Status: DC
  Filled 2015-10-31 (×2): qty 1

## 2015-10-31 MED ORDER — IBUPROFEN 400 MG PO TABS: 600.0000 mg | ORAL_TABLET | Freq: Three times a day (TID) | ORAL | Status: DC | PRN

## 2015-10-31 MED ORDER — DOXAZOSIN MESYLATE 4 MG PO TABS: 4.0000 mg | ORAL_TABLET | Freq: Every day | ORAL | Status: DC

## 2015-10-31 MED ORDER — HALOPERIDOL LACTATE 5 MG/ML IJ SOLN
5.0000 mg | Freq: Once | INTRAMUSCULAR | Status: AC
  Administered 2015-10-31: 5 mg via INTRAMUSCULAR
  Filled 2015-10-31: qty 1

## 2015-10-31 MED ORDER — PAROXETINE HCL 20 MG PO TABS: 40.0000 mg | ORAL_TABLET | ORAL | Status: DC

## 2015-10-31 MED ORDER — ZIPRASIDONE MESYLATE 20 MG IM SOLR: 10.0000 mg | Freq: Four times a day (QID) | INTRAMUSCULAR | Status: DC | PRN

## 2015-10-31 MED ORDER — PAROXETINE HCL 10 MG PO TABS: 10.0000 mg | ORAL_TABLET | Freq: Every day | ORAL | Status: DC

## 2015-10-31 NOTE — Progress Notes (Signed)
CSW met with pt's family to provide emotional support re: pt/recent events.  Family updated on plan of care for pt/BH bed search process.  CSW will continue to follow and support family/pt as necessary.

## 2015-10-31 NOTE — ED Notes (Signed)
Pt is not responding to questions. Pt only responding my grunting, and hand motions. RN asked pt did he want to hurt himself, pt responded with a arm shrug. Per family, pt made a comment while driving that he wanted to jump out of the car. RN notified MD. Verbal order for SI precautions and sitter initiated.

## 2015-10-31 NOTE — ED Notes (Signed)
Pt refusing to have Vitals done by this RN, by sitter and by NT.

## 2015-10-31 NOTE — Consult Note (Signed)
NEURO HOSPITALIST CONSULT NOTE   Referring physician: Vanita Panda   Reason for Consult: neurological exam due to psych requesting  HPI:                                                                                                                                          Brad Miller is an 64 y.o. male who is non verbal and refuses to speak. Per notes "Patient goes back and forth to [place] for work several times a year, and the week of halloween started "confessing sins" while in [place] and when he returned he started confessnig sins to his wife. Patient acknowledges depression (nods) Wife reports his depressed symptoms appeared to be worsening over the last week and patient stopped talking for last 2 days. Reports he seems confused at times, needing help placing his CPAP and getting lost on the way back from [place] once he got to [place]. He has seemed agitated. "  currently he is flapping both arms and legs, he will hold both arms up wen asked where he is, follows commands at times and others will not. ED asked neuro to evaluate.   Past Medical History  Diagnosis Date  . Hyperlipidemia     TAKES LOVASTATIN NIGHTLY  . Depression     TAKES WELLBUTRIN DAILY  . Anxiety     TAKES XANAX DAILY AS NEEDED  . Hypertension     TAKES CARDURA DAILY  . Sleep apnea     USES CPAP  . Headache(784.0)     OCCASIONALLY  . Arthritis   . Joint pain   . Urinary frequency   . Urinary urgency   . Enlarged prostate   . Diabetes mellitus without complication (HCC)     BORDERLINE  . Cataracts, bilateral   . Rash     AROUND NOSE AND STATES D/T NOT USING LOTION WHILE SHAVING    Past Surgical History  Procedure Laterality Date  . 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 Total knee arthroplasty Right 02/23/2014    Procedure: TOTAL KNEE ARTHROPLASTY;  Surgeon: Kerin Salen, MD;  Location: [facility];  Service: Orthopedics;  Laterality: Right;  . Knee arthroscopy Left 02/23/2014   Procedure: LEFT ARTHROSCOPY KNEE;  Surgeon: Kerin Salen, MD;  Location: [facility];  Service: Orthopedics;  Laterality: Left;    Family History  Problem Relation Age of Onset  . Hypertension Mother   . Heart attack Mother   . Osteoporosis Mother   . CVA Mother   . Coronary artery disease Mother   . Hypertension Father   . Emphysema Father   . Coronary artery disease Brother   . Diabetes Brother      Social History:  reports that he has never smoked. He does not have  any smokeless tobacco history on file. He reports that he drinks alcohol. He reports that he does not use illicit drugs.  Allergies  Allergen Reactions  . Ciprofloxacin     GI side effects   . Hydrocodone-Acetaminophen     Itch/constipation  . Penicillins     Childhood allergy. Knot on arm Has patient had a PCN reaction causing immediate rash, facial/tongue/throat swelling, SOB or lightheadedness with hypotension: No Has patient had a PCN reaction causing severe rash involving mucus membranes or skin necrosis: No Has patient had a PCN reaction that required hospitalization No Has patient had a PCN reaction occurring within the last 10 years: No If all of the above answers are "NO", then may pro  . Zocor [Simvastatin]     Back aches     MEDICATIONS:                                                                                                                     Current Facility-Administered Medications  Medication Dose Route Frequency Provider Last Rate Last Dose  . acetaminophen (TYLENOL) tablet 650 mg  650 mg Oral Q4H PRN Voncille Lo, MD      . ibuprofen (ADVIL,MOTRIN) tablet 600 mg  600 mg Oral Q8H PRN Voncille Lo, MD       Current Outpatient Prescriptions  Medication Sig Dispense Refill  . acetaminophen (TYLENOL) 500 MG tablet Take 1,000 mg by mouth every 6 (six) hours as needed for mild pain or moderate pain.     Marland Kitchen buPROPion (WELLBUTRIN SR) 150 MG 12 hr tablet Take 150 mg by mouth 2 (two) times daily.     . Butalbital-APAP-Caffeine (FIORICET PO) Take 1-2 tablets by mouth every 4 (four) hours as needed (for headache).     . clonazePAM (KLONOPIN) 1 MG tablet Take 1 mg by mouth 2 (two) times daily as needed for anxiety.    Marland Kitchen doxazosin (CARDURA) 8 MG tablet Take 4 mg by mouth daily.    Marland Kitchen ibuprofen (ADVIL,MOTRIN) 800 MG tablet Take 800 mg by mouth every 8 (eight) hours as needed for moderate pain.     . Omega-3 Fatty Acids (FISH OIL PO) Take 4 capsules by mouth every morning.    Marland Kitchen PARoxetine (PAXIL) 10 MG tablet Take 1 tablet by mouth daily.    Marland Kitchen PARoxetine (PAXIL) 40 MG tablet Take 40 mg by mouth every morning.    Marland Kitchen POTASSIUM PO Take 1 tablet by mouth daily.    Marland Kitchen pyridOXINE (VITAMIN B-6) 50 MG tablet Take 50 mg by mouth every other day.     . sildenafil (VIAGRA) 50 MG tablet Take 25-50 mg by mouth daily as needed for erectile dysfunction.    Marland Kitchen testosterone cypionate (DEPOTESTOTERONE CYPIONATE) 200 MG/ML injection Inject 300 mg into the muscle every 28 (twenty-eight) days.     . traZODone (DESYREL) 100 MG tablet Take 100 mg by mouth at bedtime.    . TURMERIC PO Take 1 capsule by mouth daily.    Marland Kitchen  vitamin B-12 (CYANOCOBALAMIN) 1000 MCG tablet Take 1,000 mcg by mouth daily.     Marland Kitchen aspirin EC 325 MG tablet Take 1 tablet (325 mg total) by mouth 2 (two) times daily. (Patient not taking: Reported on 10/30/2015) 30 tablet 0  . methocarbamol (ROBAXIN) 500 MG tablet Take 1 tablet (500 mg total) by mouth 2 (two) times daily with a meal. (Patient not taking: Reported on 10/30/2015) 60 tablet 0  . oxyCODONE-acetaminophen (ROXICET) 5-325 MG per tablet Take 1 tablet by mouth every 4 (four) hours as needed. (Patient not taking: Reported on 10/30/2015) 60 tablet 0      ROS:                                                                                                                                       History obtained from unobtainable from patient due to mental status   Blood pressure 145/90, pulse 87, resp.  rate 19, SpO2 97 %.   Neurologic Examination:                                                                                                      HEENT-  Normocephalic, no lesions, without obvious abnormality.  Normal external eye and conjunctiva.  Normal TM's bilaterally.  Normal auditory canals and external ears. Normal external nose, mucus membranes and septum.  Normal pharynx. Cardiovascular- S1, S2 normal, pulses palpable throughout   Lungs- chest clear, no wheezing, rales, normal symmetric air entry Abdomen- normal findings: bowel sounds normal Extremities- no edema Lymph-no adenopathy palpable Musculoskeletal-no joint tenderness, deformity or swelling Skin-warm and dry, no hyperpigmentation, vitiligo, or suspicious lesions  Neurological Examination Mental Status: Alert.  Non-verbal.  Follows commands Cranial Nerves: II: Discs flat bilaterally; Visual fields grossly normal, pupils equal, round, reactive to light and accommodation III,IV, VI: ptosis not present, extra-ocular motions intact bilaterally V,VII: face symmetric, facial light touch sensation normal bilaterally VIII: hearing normal bilaterally IX,X: uvula rises symmetrically XI: bilateral shoulder shrug XII: midline tongue extension Motor: Right : Upper extremity   5/5    Left:     Upper extremity   5/5  Lower extremity   5/5     Lower extremity   5/5 Tone and bulk:normal tone throughout; no atrophy noted Sensory: Pinprick and light touch intact throughout, bilaterally Deep Tendon Reflexes: 2+ and symmetric throughout Plantars: Right: downgoing   Left: downgoing Cerebellar: Not able to obtain Gait: not able to obtain      Lab Results: Basic Metabolic  Panel:  Recent Labs Lab 10/30/15 1659  NA 138  K 4.0  CL 103  CO2 24  GLUCOSE 109*  BUN 16  CREATININE 0.82  CALCIUM 9.4    Liver Function Tests:  Recent Labs Lab 10/30/15 1659  AST 16  ALT 38  ALKPHOS 68  BILITOT 1.0  PROT 7.4  ALBUMIN 4.2    No results for input(s): LIPASE, AMYLASE in the last 168 hours. No results for input(s): AMMONIA in the last 168 hours.  CBC:  Recent Labs Lab 10/30/15 1659  WBC 9.3  HGB 14.8  HCT 43.6  MCV 87.6  PLT 256    Cardiac Enzymes: No results for input(s): CKTOTAL, CKMB, CKMBINDEX, TROPONINI in the last 168 hours.  Lipid Panel: No results for input(s): CHOL, TRIG, HDL, CHOLHDL, VLDL, LDLCALC in the last 168 hours.  CBG: No results for input(s): GLUCAP in the last 168 hours.  Microbiology: Results for orders placed or performed during the hospital encounter of 02/20/14  Surgical pcr screen     Status: None   Collection Time: 02/20/14  3:23 PM  Result Value Ref Range Status   MRSA, PCR NEGATIVE NEGATIVE Final   Staphylococcus aureus NEGATIVE NEGATIVE Final    Comment:        The Xpert SA Assay (FDA approved for NASAL specimens in patients over 40 years of age), is one component of a comprehensive surveillance program.  Test performance has been validated by [employer] for patients greater than or equal to 49 year old. It is not intended to diagnose infection nor to guide or monitor treatment.    Coagulation Studies: No results for input(s): LABPROT, INR in the last 72 hours.  Imaging: Mr Herby Abraham Contrast  10/30/2015  CLINICAL DATA:  Increasing difficulty speaking and answer questions over the last week. EXAM: MRI HEAD WITHOUT CONTRAST TECHNIQUE: Multiplanar, multiecho pulse sequences of the brain and surrounding structures were obtained without intravenous contrast. COMPARISON:  None. FINDINGS: The study is severely degraded by patient motion. No acute infarct, hemorrhage, or mass lesion is present. Mild periventricular and subcortical T2 changes are noted. These extend into the brainstem. Ventricles are of normal size. No definite extra-axial fluid collection is present. Flow is present in the major intracranial arteries. Bilateral lens replacements are present. The  paranasal sinuses mastoid air cells are clear. IMPRESSION: 1. No acute intracranial abnormality. 2. The study is severely degraded by patient motion. Electronically Signed   By: San Morelle M.D.   On: 10/30/2015 19:32       Assessment and plan per attending neurologist  Etta Quill PA-C [facility] [phone]  10/31/2015, 4:06 PM   Assessment/Plan: 64 YO male with altered mental status.Marland Kitchen MRI negative for acute abnormality. Exam is not consistent with central neurological process and more indicative of psych.   Recommend: 1) No further neurological work up. 2) agree with transfer to [facility]    Patient seen and examined together with physician assistant and I concur with the assessment and plan.  Dorian Pod, MD

## 2015-10-31 NOTE — Consult Note (Signed)
[facility] Psychiatry Consult   Reason for Consult:  Bizarre non-verbal behavior Referring Physician:  Walk In  Patient Identification: Brad Miller MRN:  [id number] Principal Diagnosis: Acute confusional state Diagnosis:   Patient Active Problem List   Diagnosis Date Noted  . Acute confusional state [F05] 10/31/2015    Priority: High  . 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 Arthritis of right knee [M12.9] 02/23/2014    Total Time spent with patient: 45 minutes  Subjective:   Brad Miller is a 64 y.o. male patient was seen for sudden bizzare behavior.  HPI:  Brad Miller, 64 yo male came in initially yesterday 10/30/2015 to [facility] as a walk in.  He was accompanied by his wife who was concerned greatly about his behavior.  She states that in Nov 1, he lost his direction driving back home from [place].  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  His wife described that he had sudden acute onset of loss of speech and a dazed appearance.  He would rock back and forth and would have occasional shuffling gait.  He was sent to ED for medical clearance.  Today, his wife brought him back to [facility].  Patietn again presented non-verbal with shuffling gait.  He appeared like he wanted to say something but just stared at this Probation officer.  Then his gaze would look elsewhere.  His bilateral grips were even.  Despite encouragement, pateint unable to answer questions.  Spoke with [facility] MD and he suggested to contact Neuro on call.  Spoke with Dr Aram Beecham -Neuro.  Patient transported to [facility] via [facility].  Discussed case with Dr Colin Rhein [facility] ED.  Discussed case with Dr Dwyane Dee who is in aagreement to send patient for re-eval med clearance.  Care transeferred   Past Psychiatric History:  Depression and anxiety  Risk to Self:   Risk to Others:   Prior Inpatient Therapy:   Prior Outpatient Therapy:    Past Medical History:  Past Medical History  Diagnosis Date  . Hyperlipidemia     TAKES  LOVASTATIN NIGHTLY  . Depression     TAKES WELLBUTRIN DAILY  . Anxiety     TAKES XANAX DAILY AS NEEDED  . Hypertension     TAKES CARDURA DAILY  . Sleep apnea     USES CPAP  . Headache(784.0)     OCCASIONALLY  . Arthritis   . Joint pain   . Urinary frequency   . Urinary urgency   . Enlarged prostate   . Diabetes mellitus without complication (HCC)     BORDERLINE  . Cataracts, bilateral   . Rash     AROUND NOSE AND STATES D/T NOT USING LOTION WHILE SHAVING    Past Surgical History  Procedure Laterality Date  . 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 Total knee arthroplasty Right 02/23/2014    Procedure: TOTAL KNEE ARTHROPLASTY;  Surgeon: Kerin Salen, MD;  Location: [facility];  Service: Orthopedics;  Laterality: Right;  . Knee arthroscopy Left 02/23/2014    Procedure: LEFT ARTHROSCOPY KNEE;  Surgeon: Kerin Salen, MD;  Location: [facility];  Service: Orthopedics;  Laterality: Left;   Family History:  Family History  Problem Relation Age of Onset  . Hypertension Mother   . Heart attack Mother   . Osteoporosis Mother   . CVA Mother   . Coronary artery disease Mother   . Hypertension Father   . Emphysema Father   . Coronary  artery disease Brother   . Diabetes Brother    Family Psychiatric  History: Unable to speak Social History:  History  Alcohol Use  . Yes    Comment: OCCASIONALLY     History  Drug Use No    Social History   Social History  . Marital Status: Married    Spouse Name: N/A  . Number of Children: N/A  . Years of Education: N/A   Social History Main Topics  . Smoking status: Never Smoker   . Smokeless tobacco: Not on file  . Alcohol Use: Yes     Comment: OCCASIONALLY  . Drug Use: No  . Sexual Activity: Not on file   Other Topics Concern  . Not on file   Social History Narrative   Additional Social History:                          Allergies:   Allergies  Allergen Reactions  . Ciprofloxacin     GI side effects   .  Hydrocodone-Acetaminophen     Itch/constipation  . Penicillins     Childhood allergy. Knot on arm Has patient had a PCN reaction causing immediate rash, facial/tongue/throat swelling, SOB or lightheadedness with hypotension: No Has patient had a PCN reaction causing severe rash involving mucus membranes or skin necrosis: No Has patient had a PCN reaction that required hospitalization No Has patient had a PCN reaction occurring within the last 10 years: No If all of the above answers are "NO", then may pro  . Zocor [Simvastatin]     Back aches     Labs:  Results for orders placed or performed during the hospital encounter of 10/30/15 (from the past 48 hour(s))  Comprehensive metabolic panel     Status: Abnormal   Collection Time: 10/30/15  4:59 PM  Result Value Ref Range   Sodium 138 135 - 145 mmol/L   Potassium 4.0 3.5 - 5.1 mmol/L   Chloride 103 101 - 111 mmol/L   CO2 24 22 - 32 mmol/L   Glucose, Bld 109 (H) 65 - 99 mg/dL   BUN 16 6 - 20 mg/dL   Creatinine, Ser 0.82 0.61 - 1.24 mg/dL   Calcium 9.4 8.9 - 10.3 mg/dL   Total Protein 7.4 6.5 - 8.1 g/dL   Albumin 4.2 3.5 - 5.0 g/dL   AST 16 15 - 41 U/L   ALT 38 17 - 63 U/L   Alkaline Phosphatase 68 38 - 126 U/L   Total Bilirubin 1.0 0.3 - 1.2 mg/dL   GFR calc non Af Amer >60 >60 mL/min   GFR calc Af Amer >60 >60 mL/min    Comment: (NOTE) The eGFR has been calculated using the CKD EPI equation. This calculation has not been validated in all clinical situations. eGFR's persistently <60 mL/min signify possible Chronic Kidney Disease.    Anion gap 11 5 - 15  Ethanol (ETOH)     Status: None   Collection Time: 10/30/15  4:59 PM  Result Value Ref Range   Alcohol, Ethyl (B) <5 <5 mg/dL    Comment:        LOWEST DETECTABLE LIMIT FOR SERUM ALCOHOL IS 5 mg/dL FOR MEDICAL PURPOSES ONLY   Salicylate level     Status: None   Collection Time: 10/30/15  4:59 PM  Result Value Ref Range   Salicylate Lvl <1.7 2.8 - 30.0 mg/dL   Acetaminophen level     Status: Abnormal  Collection Time: 10/30/15  4:59 PM  Result Value Ref Range   Acetaminophen (Tylenol), Serum <10 (L) 10 - 30 ug/mL    Comment:        THERAPEUTIC CONCENTRATIONS VARY SIGNIFICANTLY. A RANGE OF 10-30 ug/mL MAY BE AN EFFECTIVE CONCENTRATION FOR MANY PATIENTS. HOWEVER, SOME ARE BEST TREATED AT CONCENTRATIONS OUTSIDE THIS RANGE. ACETAMINOPHEN CONCENTRATIONS >150 ug/mL AT 4 HOURS AFTER INGESTION AND >50 ug/mL AT 12 HOURS AFTER INGESTION ARE OFTEN ASSOCIATED WITH TOXIC REACTIONS.   CBC     Status: None   Collection Time: 10/30/15  4:59 PM  Result Value Ref Range   WBC 9.3 4.0 - 10.5 K/uL   RBC 4.98 4.22 - 5.81 MIL/uL   Hemoglobin 14.8 13.0 - 17.0 g/dL   HCT 43.6 39.0 - 52.0 %   MCV 87.6 78.0 - 100.0 fL   MCH 29.7 26.0 - 34.0 pg   MCHC 33.9 30.0 - 36.0 g/dL   RDW 13.0 11.5 - 15.5 %   Platelets 256 150 - 400 K/uL  Urine rapid drug screen (hosp performed) (Not at [facility])     Status: None   Collection Time: 10/30/15  5:46 PM  Result Value Ref Range   Opiates NONE DETECTED NONE DETECTED   Cocaine NONE DETECTED NONE DETECTED   Benzodiazepines NONE DETECTED NONE DETECTED   Amphetamines NONE DETECTED NONE DETECTED   Tetrahydrocannabinol NONE DETECTED NONE DETECTED   Barbiturates NONE DETECTED NONE DETECTED    Comment:        DRUG SCREEN FOR MEDICAL PURPOSES ONLY.  IF CONFIRMATION IS NEEDED FOR ANY PURPOSE, NOTIFY LAB WITHIN 5 DAYS.        LOWEST DETECTABLE LIMITS FOR URINE DRUG SCREEN Drug Class       Cutoff (ng/mL) Amphetamine      1000 Barbiturate      200 Benzodiazepine   128 Tricyclics       786 Opiates          300 Cocaine          300 THC              50   Urinalysis, Routine w reflex microscopic (not at [facility])     Status: Abnormal   Collection Time: 10/30/15  5:46 PM  Result Value Ref Range   Color, Urine AMBER (A) YELLOW    Comment: BIOCHEMICALS MAY BE AFFECTED BY COLOR   APPearance CLEAR CLEAR   Specific Gravity, Urine  1.028 1.005 - 1.030   pH 6.0 5.0 - 8.0   Glucose, UA NEGATIVE NEGATIVE mg/dL   Hgb urine dipstick NEGATIVE NEGATIVE   Bilirubin Urine SMALL (A) NEGATIVE   Ketones, ur NEGATIVE NEGATIVE mg/dL   Protein, ur NEGATIVE NEGATIVE mg/dL   Urobilinogen, UA 1.0 0.0 - 1.0 mg/dL   Nitrite NEGATIVE NEGATIVE   Leukocytes, UA NEGATIVE NEGATIVE    Comment: MICROSCOPIC NOT DONE ON URINES WITH NEGATIVE PROTEIN, BLOOD, LEUKOCYTES, NITRITE, OR GLUCOSE <1000 mg/dL.    Current Outpatient Prescriptions  Medication Sig Dispense Refill  . acetaminophen (TYLENOL) 500 MG tablet Take 1,000 mg by mouth every 6 (six) hours as needed for mild pain or moderate pain.     Marland Kitchen aspirin EC 325 MG tablet Take 1 tablet (325 mg total) by mouth 2 (two) times daily. (Patient not taking: Reported on 10/30/2015) 30 tablet 0  . buPROPion (WELLBUTRIN SR) 150 MG 12 hr tablet Take 150 mg by mouth 2 (two) times daily.    . Butalbital-APAP-Caffeine (FIORICET PO) Take  1-2 tablets by mouth every 4 (four) hours as needed (for headache).     . clonazePAM (KLONOPIN) 1 MG tablet Take 1 mg by mouth 2 (two) times daily as needed for anxiety.    Marland Kitchen doxazosin (CARDURA) 8 MG tablet Take 4 mg by mouth daily.    Marland Kitchen ibuprofen (ADVIL,MOTRIN) 800 MG tablet Take 800 mg by mouth every 8 (eight) hours as needed for moderate pain.     . methocarbamol (ROBAXIN) 500 MG tablet Take 1 tablet (500 mg total) by mouth 2 (two) times daily with a meal. (Patient not taking: Reported on 10/30/2015) 60 tablet 0  . Omega-3 Fatty Acids (FISH OIL PO) Take 4 capsules by mouth every morning.    Marland Kitchen oxyCODONE-acetaminophen (ROXICET) 5-325 MG per tablet Take 1 tablet by mouth every 4 (four) hours as needed. (Patient not taking: Reported on 10/30/2015) 60 tablet 0  . PARoxetine (PAXIL) 10 MG tablet Take 1 tablet by mouth daily.    Marland Kitchen PARoxetine (PAXIL) 40 MG tablet Take 40 mg by mouth every morning.    Marland Kitchen POTASSIUM PO Take 1 tablet by mouth daily.    Marland Kitchen pyridOXINE (VITAMIN B-6) 50 MG  tablet Take 50 mg by mouth every other day.     . sildenafil (VIAGRA) 50 MG tablet Take 25-50 mg by mouth daily as needed for erectile dysfunction.    Marland Kitchen testosterone cypionate (DEPOTESTOTERONE CYPIONATE) 200 MG/ML injection Inject 300 mg into the muscle every 28 (twenty-eight) days.     . traZODone (DESYREL) 100 MG tablet Take 100 mg by mouth at bedtime.    . TURMERIC PO Take 1 capsule by mouth daily.    . vitamin B-12 (CYANOCOBALAMIN) 1000 MCG tablet Take 1,000 mcg by mouth daily.      No current facility-administered medications for this encounter.    Musculoskeletal: Strength & Muscle Tone: within normal limits Gait & Station: normal Patient leans: N/A  Psychiatric Specialty Exam: Review of Systems  Neurological: Positive for sensory change.    There were no vitals taken for this visit.There is no weight on file to calculate BMI.  General Appearance: Well Groomed  Engineer, water::  Fair  Speech:  Blocked  Volume:  na  Mood:  NA  Affect:  NA  Thought Process:  not speaking  Orientation:  Other:  patient is not speaking  Thought Content:  NA  Suicidal Thoughts:  No  Homicidal Thoughts:  No  Memory:  not able to assess  Judgement:  NA  Insight:  NA  Psychomotor Activity:  Shuffling Gait  Concentration:  NA  Recall:  NA  Fund of Knowledge:NA  Language: NA  Akathisia:  Negative  Handed:  NA  AIMS (if indicated):     Assets:  Resilience Social Support  ADL's:  Intact  Cognition: Impaired,  Moderate  Sleep:      Treatment Plan Summary: At [facility] for re eval and med clearance  Disposition: need med clearance  Freda Munro May [facility] 10/31/2015 3:34 PM

## 2015-10-31 NOTE — ED Notes (Signed)
Pt becoming more agitated and attempting to break the bed side rails. Order given for Haldol.

## 2015-10-31 NOTE — ED Notes (Signed)
Family would like to be contacted with any updates:  Amy Ceniceros: [place]: [phone]

## 2015-10-31 NOTE — ED Notes (Signed)
Pt was sent here by behavioral health today for eval of agitation and not speaking. Pt was seen at [facility] last night and discharged. Family states that agitation increased and went to behavioral health for help. AC Tina at behavioral was reported to have spoken to a neurologist here and told to come here.

## 2015-10-31 NOTE — ED Notes (Signed)
Pt wife has all belongings and took them home.

## 2015-10-31 NOTE — ED Notes (Signed)
Family updated about POC and we will call in case pt gets a bed on [facility].

## 2015-11-01 ENCOUNTER — Inpatient Hospital Stay (HOSPITAL_COMMUNITY)
Admission: AD | Admit: 2015-11-01 | Discharge: 2015-11-06 | DRG: 885 | Disposition: A | Discharge status: Home / Self Care | Payer: Commercial Managed Care - HMO | Source: Intra-hospital | Attending: Psychiatry | Admitting: Psychiatry

## 2015-11-01 ENCOUNTER — Encounter (HOSPITAL_COMMUNITY): Payer: Self-pay | Admitting: *Deleted

## 2015-11-01 DIAGNOSIS — F329 Major depressive disorder, single episode, unspecified: Secondary | ICD-10-CM

## 2015-11-01 DIAGNOSIS — G4700 Insomnia, unspecified: Secondary | ICD-10-CM | POA: Diagnosis present

## 2015-11-01 DIAGNOSIS — I10 Essential (primary) hypertension: Secondary | ICD-10-CM | POA: Diagnosis present

## 2015-11-01 DIAGNOSIS — Z823 Family history of stroke: Secondary | ICD-10-CM | POA: Diagnosis not present

## 2015-11-01 DIAGNOSIS — R410 Disorientation, unspecified: Secondary | ICD-10-CM | POA: Diagnosis not present

## 2015-11-01 DIAGNOSIS — F05 Delirium due to known physiological condition: Secondary | ICD-10-CM | POA: Diagnosis not present

## 2015-11-01 DIAGNOSIS — Z96651 Presence of right artificial knee joint: Secondary | ICD-10-CM | POA: Diagnosis present

## 2015-11-01 DIAGNOSIS — F09 Unspecified mental disorder due to known physiological condition: Secondary | ICD-10-CM | POA: Diagnosis not present

## 2015-11-01 DIAGNOSIS — M1711 Unilateral primary osteoarthritis, right knee: Secondary | ICD-10-CM | POA: Diagnosis present

## 2015-11-01 DIAGNOSIS — F332 Major depressive disorder, recurrent severe without psychotic features: Secondary | ICD-10-CM | POA: Diagnosis not present

## 2015-11-01 DIAGNOSIS — Z833 Family history of diabetes mellitus: Secondary | ICD-10-CM

## 2015-11-01 DIAGNOSIS — E119 Type 2 diabetes mellitus without complications: Secondary | ICD-10-CM | POA: Diagnosis present

## 2015-11-01 DIAGNOSIS — F410 Panic disorder [episodic paroxysmal anxiety] without agoraphobia: Secondary | ICD-10-CM | POA: Diagnosis not present

## 2015-11-01 DIAGNOSIS — Z8249 Family history of ischemic heart disease and other diseases of the circulatory system: Secondary | ICD-10-CM

## 2015-11-01 LAB — RAPID URINE DRUG SCREEN, HOSP PERFORMED
AMPHETAMINES: NOT DETECTED
BARBITURATES: NOT DETECTED
BENZODIAZEPINES: NOT DETECTED
Cocaine: NOT DETECTED
Opiates: NOT DETECTED
TETRAHYDROCANNABINOL: NOT DETECTED

## 2015-11-01 MED ORDER — MAGNESIUM HYDROXIDE 400 MG/5ML PO SUSP
30.0000 mL | Freq: Every day | ORAL | Status: DC | PRN
  Administered 2015-11-01: 30 mL via ORAL
  Filled 2015-11-01: qty 30

## 2015-11-01 MED ORDER — ALUM & MAG HYDROXIDE-SIMETH 200-200-20 MG/5ML PO SUSP: 30.0000 mL | ORAL | Status: DC | PRN

## 2015-11-01 MED ORDER — STERILE WATER FOR INJECTION IJ SOLN
INTRAMUSCULAR | Status: AC
  Filled 2015-11-01: qty 10

## 2015-11-01 MED ORDER — TRAZODONE HCL 100 MG PO TABS
100.0000 mg | ORAL_TABLET | Freq: Every day | ORAL | Status: DC
  Administered 2015-11-01 – 2015-11-03 (×3): 100 mg via ORAL
  Filled 2015-11-01 (×6): qty 1

## 2015-11-01 MED ORDER — DOXAZOSIN MESYLATE 4 MG PO TABS
4.0000 mg | ORAL_TABLET | Freq: Every day | ORAL | Status: DC
  Administered 2015-11-02 – 2015-11-06 (×5): 4 mg via ORAL
  Filled 2015-11-01 (×7): qty 1

## 2015-11-01 MED ORDER — IBUPROFEN 600 MG PO TABS
600.0000 mg | ORAL_TABLET | Freq: Three times a day (TID) | ORAL | Status: DC | PRN
  Administered 2015-11-02 – 2015-11-03 (×2): 600 mg via ORAL
  Filled 2015-11-01 (×2): qty 1

## 2015-11-01 MED ORDER — PAROXETINE HCL 30 MG PO TABS
50.0000 mg | ORAL_TABLET | Freq: Every day | ORAL | Status: DC
  Administered 2015-11-02: 50 mg via ORAL
  Filled 2015-11-01 (×2): qty 1

## 2015-11-01 MED ORDER — BUPROPION HCL ER (SR) 150 MG PO TB12
150.0000 mg | ORAL_TABLET | Freq: Two times a day (BID) | ORAL | Status: DC
Start: 2015-11-01 — End: 2015-11-06
  Administered 2015-11-01 – 2015-11-06 (×10): 150 mg via ORAL
  Filled 2015-11-01 (×15): qty 1

## 2015-11-01 MED ORDER — ZIPRASIDONE MESYLATE 20 MG IM SOLR
10.0000 mg | Freq: Once | INTRAMUSCULAR | Status: AC
Start: 2015-11-01 — End: 2015-11-01
  Administered 2015-11-01: 10 mg via INTRAMUSCULAR
  Filled 2015-11-01: qty 20

## 2015-11-01 MED ORDER — CLONAZEPAM 1 MG PO TABS
1.0000 mg | ORAL_TABLET | Freq: Two times a day (BID) | ORAL | Status: DC | PRN
  Administered 2015-11-02 (×2): 1 mg via ORAL
  Filled 2015-11-01 (×2): qty 1

## 2015-11-01 NOTE — ED Notes (Signed)
Pt has stopped slapping the bed.  He began to make repetitive movements pointing towards the sharps container on the wall.  RN asked him about this he whispered "needles" and had a frightened look on his face.  RN asked housekeeping to remove the box.  Pt appeared to relax more.  RN asked pt if he heard voices, he pointed at this RN and Tech, he denied any other voices.

## 2015-11-01 NOTE — ED Provider Notes (Signed)
Accepted to [facility] by Dr. Arbie Cookey. Patient is medically cleared and stable for transfer.  Forde Dandy, MD 11/01/15 [phone]

## 2015-11-01 NOTE — ED Notes (Signed)
Pt resting comfortably at this time.

## 2015-11-01 NOTE — ED Notes (Addendum)
This EMT contacted pt.'s wife at home phone to confirm delivery of husband's CPAP to [facility].  She advised me of a call she received 2 hrs prior from a [facility] staff member, and shared that she planned to deliver everything at [place].

## 2015-11-01 NOTE — Progress Notes (Signed)
Brad Miller is a 64 year old male admitted to [facility] voluntarily.  According to previous notes, he was brought to the hospital because of "sudden acute onset of loss of speech and dazed appearance."  According to notes, he was rocking back and forth and was nonverbal.  According to lab results, pt had an MRI of brain without contrast and the results state "the study is severely degraded by patient motion."  According to notes, pt was aggressive in the ED and "broke 2 beds in the ED" and required IM medications.    Pt reports he is at [facility] because "I need help with managing stress."  He reports medical history of sleep apnea, requiring a CPAP.  He also reports that he is "borderline" diabetic and does not require insulin.  Pt denies tobacco and drug use.  He reports he drinks "a beer" occasionally when he goes out to eat for dinner.  He denies history of abuse.  He denies SI/HI, denies hallucinations, denies pain.  He reports "last night I was pounding the bed and the chair arms."  He was referring to when he was at the ED.  Pt reports support system consisting of his wife and his children.  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  Pt was cooperative with admission process.  Non-invasive body assessment was performed by other staff prior to admission assessment done by Probation officer.  He has no belongings that needed to be placed in a locker.  Order obtained for CPAP after patient verbally contracted for safety with Probation officer.  Pt oriented to  unit and unit routine.  Medications administered per order.  PRN medication administered for constipation.  Prune juice provided for constipation.    Pt is resting in his room at this time with his CPAP machine on.  He verbally contracts for safety and reports that he will inform staff of needs and concerns.  Will continue to monitor and assess.

## 2015-11-01 NOTE — ED Notes (Signed)
Pt requesting medication for anxiety. Pt calm and cooperative at this time, offered Klonopin - pt agreed to take med.

## 2015-11-01 NOTE — ED Notes (Signed)
Pt offered snack at this time.

## 2015-11-01 NOTE — ED Notes (Signed)
Attempted to call [facility] [place] for TTS - no answer.
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
## 2015-11-01 NOTE — Progress Notes (Signed)
Confirmed (with Beola Cord, [place]) that [facility] has accept pt for admission this pm.  RN informed and will call transport and report.  Pt informed and agreeable to plan.

## 2015-11-01 NOTE — Tx Team (Signed)
Initial Interdisciplinary Treatment Plan   PATIENT STRESSORS: Financial difficulties Occupational concerns   PATIENT STRENGTHS: Average or above average intelligence Communication skills General fund of knowledge Motivation for treatment/growth Supportive family/friends   PROBLEM LIST: Problem List/Patient Goals Date to be addressed Date deferred Reason deferred Estimated date of resolution  "getting better"  11/01/15     "get happier"  11/01/15                                                DISCHARGE CRITERIA:  Improved stabilization in mood, thinking, and/or behavior Motivation to continue treatment in a less acute level of care  PRELIMINARY DISCHARGE PLAN: Outpatient therapy  PATIENT/FAMIILY INVOLVEMENT: This treatment plan has been presented to and reviewed with the patient, Brad Miller.  The patient and family have been given the opportunity to ask questions and make suggestions.  Karie Kirks 11/01/2015, 10:36 PM

## 2015-11-01 NOTE — ED Provider Notes (Signed)
Arrival Date & Time: 10/31/15 & 1425 History   Chief Complaint  Patient presents with  . Speech Problem   HPI Brad Miller is a 64 y.o. male who presents with bizarre behavior since return from work trip from Sept 1st to Nov 1st. Decreased speech with self harm activities. And endorsements of desire to hurt self and confess sins.  HPI Limitations: include the patient's refusal to speak with the Emergency Medicine staff.   A full HPI, Past Medical/Surgical, Family, and Social history and review of systems could not be completed due to limitations of refusal to cooperate and expressive aphasia. Therefore the E&M emergency caveat is invoked. I made several attempts to obtain HPI from other sources. The limited supplemental history I obtained from family and prior medical records was used in my medical decision making.   Patient arrives after family found him beating head on wall.  No Hx of prior self-harm or illicit drug use.   Patient's brother endorses traumatic event when drill fell of 20 ft ladder onto his head. No LOC of per the brother who witnessed event. Patient without obvious injury.   Prior workup involves ED eval and two [facility] visits. ED eval on 10/31/15 reveals MR brain with no acute intracranial abnormality. Labs and urine unremarkable and no neuro deficits at that eval.   Past Medical History  I reviewed & agree with nursing's documentation of PMHx, PSHx, SHx & FHx. Past Medical History  Diagnosis Date  . Hyperlipidemia     TAKES LOVASTATIN NIGHTLY  . Depression     TAKES WELLBUTRIN DAILY  . Anxiety     TAKES XANAX DAILY AS NEEDED  . Hypertension     TAKES CARDURA DAILY  . Sleep apnea     USES CPAP  . Headache(784.0)     OCCASIONALLY  . Arthritis   . Joint pain   . Urinary frequency   . Urinary urgency   . Enlarged prostate   . Diabetes mellitus without complication (HCC)     BORDERLINE  . Cataracts, bilateral   . Rash     AROUND NOSE AND STATES D/T NOT USING  LOTION WHILE SHAVING   Past Surgical History  Procedure Laterality Date  . 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 Total knee arthroplasty Right 02/23/2014    Procedure: TOTAL KNEE ARTHROPLASTY;  Surgeon: Kerin Salen, MD;  Location: [facility];  Service: Orthopedics;  Laterality: Right;  . Knee arthroscopy Left 02/23/2014    Procedure: LEFT ARTHROSCOPY KNEE;  Surgeon: Kerin Salen, MD;  Location: [facility];  Service: Orthopedics;  Laterality: Left;   Social History   Social History  . Marital Status: Married    Spouse Name: N/A  . Number of Children: N/A  . Years of Education: N/A   Social History Main Topics  . Smoking status: Never Smoker   . Smokeless tobacco: None  . Alcohol Use: Yes     Comment: OCCASIONALLY  . Drug Use: No  . Sexual Activity: Not Asked   Other Topics Concern  . None   Social History Narrative   Family History  Problem Relation Age of Onset  . Hypertension Mother   . Heart attack Mother   . Osteoporosis Mother   . CVA Mother   . Coronary artery disease Mother   . Hypertension Father   . Emphysema Father   . Coronary artery disease Brother   . Diabetes Brother     Review of Systems  ROS  limited as stated above in HPI.   Allergies  Ciprofloxacin; Hydrocodone-acetaminophen; Penicillins; and Zocor  Home Medications   Prior to Admission medications   Medication Sig Start Date End Date Taking? Authorizing Provider  acetaminophen (TYLENOL) 500 MG tablet Take 1,000 mg by mouth every 6 (six) hours as needed for mild pain or moderate pain.    Yes Historical Provider, MD  buPROPion (WELLBUTRIN SR) 150 MG 12 hr tablet Take 150 mg by mouth 2 (two) times daily.   Yes Historical Provider, MD  Butalbital-APAP-Caffeine (FIORICET PO) Take 1-2 tablets by mouth every 4 (four) hours as needed (for headache).    Yes Historical Provider, MD  clonazePAM (KLONOPIN) 1 MG tablet Take 1 mg by mouth 2 (two) times daily as needed for anxiety.   Yes Historical Provider, MD   doxazosin (CARDURA) 8 MG tablet Take 4 mg by mouth daily.   Yes Historical Provider, MD  ibuprofen (ADVIL,MOTRIN) 800 MG tablet Take 800 mg by mouth every 8 (eight) hours as needed for moderate pain.    Yes Historical Provider, MD  Omega-3 Fatty Acids (FISH OIL PO) Take 4 capsules by mouth every morning.   Yes Historical Provider, MD  PARoxetine (PAXIL) 10 MG tablet Take 1 tablet by mouth daily. 10/25/15  Yes Historical Provider, MD  PARoxetine (PAXIL) 40 MG tablet Take 40 mg by mouth every morning.   Yes Historical Provider, MD  POTASSIUM PO Take 1 tablet by mouth daily.   Yes Historical Provider, MD  pyridOXINE (VITAMIN B-6) 50 MG tablet Take 50 mg by mouth every other day.    Yes Historical Provider, MD  sildenafil (VIAGRA) 50 MG tablet Take 25-50 mg by mouth daily as needed for erectile dysfunction.   Yes Historical Provider, MD  testosterone cypionate (DEPOTESTOTERONE CYPIONATE) 200 MG/ML injection Inject 300 mg into the muscle every 28 (twenty-eight) days.    Yes Historical Provider, MD  traZODone (DESYREL) 100 MG tablet Take 100 mg by mouth at bedtime.   Yes Historical Provider, MD  TURMERIC PO Take 1 capsule by mouth daily.   Yes Historical Provider, MD  vitamin B-12 (CYANOCOBALAMIN) 1000 MCG tablet Take 1,000 mcg by mouth daily.    Yes Historical Provider, MD  aspirin EC 325 MG tablet Take 1 tablet (325 mg total) by mouth 2 (two) times daily. Patient not taking: Reported on 10/30/2015 02/23/14   Leighton Parody, PA-C  methocarbamol (ROBAXIN) 500 MG tablet Take 1 tablet (500 mg total) by mouth 2 (two) times daily with a meal. Patient not taking: Reported on 10/30/2015 02/23/14   Leighton Parody, PA-C  oxyCODONE-acetaminophen (ROXICET) 5-325 MG per tablet Take 1 tablet by mouth every 4 (four) hours as needed. Patient not taking: Reported on 10/30/2015 02/23/14   Leighton Parody, PA-C    Physical Exam  BP 145/90 mmHg  Pulse 87  Temp(Src)   Resp 19  SpO2 97% Physical Exam Vitals & Nursing  notes reviewed. CONST: male, in NAD. Appears WD/WN & stated age. HEAD: [place] with mild bruise of forehead without hematoma or defect. EYES: PERRL. No conjunctival injection & lids symmetrical. ENMT: External nose & ears atraumatic. MM moist. Oropharynx w/o swelling or exudates. NECK: Supple, w/o meningismus. Trachea midline. JVD & stridor absent. CVS: S1/S2 audible w/o gallops or obvious murmurs.  Peripheral pulses 2+ & equal in all extremities. Cap refill < 2 seconds. RESP: Respiratory effort normal. CTAB w/o wheeze. GI: NT/ND, w/o rebound or guarding. BS audible. MSK: Extremities w/o ttp, deformity or cyanosis.  Joints w/o swelling. SKIN: Warm & dry. No rash or lesion. No open wounds or suspicious wounds for self-inflicted injury. NEURO: CN II-XIII grossly intact. Sensation & Strength w/o deficit. Speech without any noise, without expressive or receptive deficit.  PSYCH: Mood & affect inappropriate. Not Cooperative. When asked question patient shakes head in yes no pattern then shrugs.  ED Course  Procedures  Labs Review Labs Reviewed  COMPREHENSIVE METABOLIC PANEL - Abnormal; Notable for the following:    Glucose, Bld 103 (*)    All other components within normal limits  ETHANOL  CBC WITH DIFFERENTIAL/PLATELET  URINE RAPID DRUG SCREEN, HOSP PERFORMED    Imaging Review Mr Brain Wo Contrast  10/30/2015  CLINICAL DATA:  Increasing difficulty speaking and answer questions over the last week. EXAM: MRI HEAD WITHOUT CONTRAST TECHNIQUE: Multiplanar, multiecho pulse sequences of the brain and surrounding structures were obtained without intravenous contrast. COMPARISON:  None. FINDINGS: The study is severely degraded by patient motion. No acute infarct, hemorrhage, or mass lesion is present. Mild periventricular and subcortical T2 changes are noted. These extend into the brainstem. Ventricles are of normal size. No definite extra-axial fluid collection is present. Flow is present in the major  intracranial arteries. Bilateral lens replacements are present. The paranasal sinuses mastoid air cells are clear. IMPRESSION: 1. No acute intracranial abnormality. 2. The study is severely degraded by patient motion. Electronically Signed   By: San Morelle M.D.   On: 10/30/2015 19:32    Laboratory and Imaging results were personally reviewed by myself and used in the medical decision making of this patient's treatment and disposition.  EKG Interpretation  EKG Interpretation  Date/Time:    Ventricular Rate:    PR Interval:    QRS Duration:   QT Interval:    QTC Calculation:   R Axis:     Text Interpretation:        MDM  Brad Miller is a 64 y.o. male with H&P as above. ED clinical course as follows:  Due to concerns for self harm patient placed in close observation and items removed. Patient required haldol 5 IM for one episode of agitation however was otherwise pleasant. Patient refuses to speak with ER physicians or when family present. Even when I examined patient alone without family he refuses to speak.  Labs and UDS metabolic and infectious panel negative. Patient afebrile. Do not suspect intracranial or infectious or metabolic in ligh of reassuring labs.   Due to concerns for psychosis, I consulted the Psych service and discussed the patient's complete H&P along with their ED clinical course to now. Specifically discussed the patient's imaging and labs. Based upon that discussion, they state the patient requires admission for continued evaluation. Per Psychiatry patient eventually spoke to psych team after some time.   Disposition: admit  Clinical Impression: No diagnosis found. Patient care discussed with Dr. Vanita Panda, who oversaw their evaluation & treatment & voiced agreement. House Officer: Voncille Lo, MD, Emergency Medicine.  Voncille Lo, MD 11/06/15 0200  Carmin Muskrat, MD 11/07/15 [phone]

## 2015-11-01 NOTE — ED Notes (Signed)
Pt up to use bathroom 

## 2015-11-01 NOTE — ED Notes (Signed)
Contacted [facility] about evaluating pt - will assess via TTS around 1300

## 2015-11-01 NOTE — ED Notes (Signed)
Meal tray given 

## 2015-11-01 NOTE — Progress Notes (Signed)
Per [facility], patient accepted at [facility], to Dr. Shea Evans, to [phone], arrival time - any time now. LCSW Jody informed.  Verlon Setting, [place] Disposition staff 11/01/2015 5:38 PM

## 2015-11-01 NOTE — ED Notes (Signed)
Consent and release faxed to [facility]

## 2015-11-01 NOTE — ED Notes (Signed)
RN found pt totally verbal and expressive.  Pt thanked Therapist, sports for "getting me out of that hell."  Pt reports that while he was aware of his actions, he was unable to control his behaviors.  "I kept thinking just shoot me..I thought that I was going to die in this bed."  When asked about the sharps container he responded that he thought that he was going to get the needles and harm himself.  When asked about what he thought was going on he stated that he was "under a lot of stress"  Informed Dr. Roxanne Mins who requested another TTS assessment.

## 2015-11-01 NOTE — ED Notes (Signed)
Pt transported with Betsy Pries

## 2015-11-01 NOTE — BHH Counselor (Addendum)
New TTS Consult request by Dr. Roxanne Mins.  Per 5:16 AM RN note, pt's condition has changed.    Discussed with Marcelene Butte, NP, who reviewed the notes in the pt record. Recommend re-evaluation instead by psychiatry this morning due to complexity of pt's issues.   Faylene Kurtz, MS, CRC, [facility] Triage Specialist [place]  **Attempted to call RN and Dr. Roxanne Mins to inform by phone but was unable to reach either one.

## 2015-11-01 NOTE — ED Notes (Addendum)
Pt continued to demonstrate esclating aggitation by hitting the side of the bed in a constant action.  W/in the past hour he went from slapping the bed one or two times to continuously w/ more force.  Verbal redirection was not successful.  Dr. Roxanne Mins notified and ordered 10mg  Geodon.  Pt was informed of IM medication w/ minimal response.  He was cooperative w/ the administration of the medication.

## 2015-11-01 NOTE — ED Notes (Signed)
Pt out of room with sitter requested to call his wife

## 2015-11-01 NOTE — ED Notes (Signed)
Contacted [facility] - pt will have TTS later this morning.

## 2015-11-01 NOTE — ED Notes (Signed)
TTS in process 

## 2015-11-01 NOTE — ED Notes (Signed)
Family member at bedside.

## 2015-11-01 NOTE — ED Notes (Signed)
This EMT was notified pt urinated on himself by the NT sitter, this EMT and the NT sitter changed the pts paper scrubs and linin with full assistance and the pt was non complient and agitated. Notifyed RN, Garlon Hatchet. Moments later NT Sitter notified this EMT and RN Garlon Hatchet that pt had to urinate again and refused to go to the bathroom or use the urinal. This EMT and the NT sitter attempted to put a condom cath on pt but would not cooperate. This EMT and the NT Sitter then put an adult diaper on pt.

## 2015-11-02 ENCOUNTER — Encounter (HOSPITAL_COMMUNITY): Payer: Self-pay | Admitting: Psychiatry

## 2015-11-02 DIAGNOSIS — F410 Panic disorder [episodic paroxysmal anxiety] without agoraphobia: Secondary | ICD-10-CM

## 2015-11-02 DIAGNOSIS — F09 Unspecified mental disorder due to known physiological condition: Secondary | ICD-10-CM

## 2015-11-02 DIAGNOSIS — F332 Major depressive disorder, recurrent severe without psychotic features: Secondary | ICD-10-CM | POA: Diagnosis present

## 2015-11-02 HISTORY — DX: Panic disorder (episodic paroxysmal anxiety): F41.0

## 2015-11-02 HISTORY — DX: Major depressive disorder, recurrent severe without psychotic features: F33.2

## 2015-11-02 MED ORDER — GLYCERIN (LAXATIVE) 2.1 G RE SUPP
1.0000 | Freq: Once | RECTAL | Status: AC
  Administered 2015-11-02: 1 via RECTAL
  Filled 2015-11-02 (×2): qty 1

## 2015-11-02 MED ORDER — OLANZAPINE 5 MG PO TABS: 5.0000 mg | ORAL_TABLET | Freq: Three times a day (TID) | ORAL | Status: DC | PRN

## 2015-11-02 MED ORDER — SERTRALINE HCL 25 MG PO TABS
25.0000 mg | ORAL_TABLET | Freq: Every day | ORAL | Status: DC
  Administered 2015-11-03 – 2015-11-06 (×4): 25 mg via ORAL
  Filled 2015-11-02 (×6): qty 1

## 2015-11-02 MED ORDER — OLANZAPINE 10 MG IM SOLR: 5.0000 mg | Freq: Three times a day (TID) | INTRAMUSCULAR | Status: DC | PRN

## 2015-11-02 NOTE — [facility] Suicide Risk Assessment (Signed)
[facility] Admission Suicide Risk Assessment   Nursing information obtained from:  Patient Demographic factors:  Male, Age 64 or older, Caucasian, Unemployed, Access to firearms Current Mental Status:  NA Loss Factors:  Decrease in vocational status, Financial problems / change in socioeconomic status Historical Factors:  Family history of mental illness or substance abuse Risk Reduction Factors:  Sense of responsibility to family, Religious beliefs about death, Living with another person, especially a relative Total Time spent with patient: 30 minutes Principal Problem: MDD (major depressive disorder), recurrent severe, without psychosis ([place]) Diagnosis:   Patient Active Problem List   Diagnosis Date Noted  . MDD (major depressive disorder), recurrent severe, without psychosis ([place]) [F33.2] 11/02/2015  . Panic attacks [F41.0] 11/02/2015  . Cognitive disorder [F09] 11/02/2015  . Acute confusional state [F05] 10/31/2015  . 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 Arthritis of right knee [M12.9] 02/23/2014     Continued Clinical Symptoms:  Alcohol Use Disorder Identification Test Final Score (AUDIT): 2 The "Alcohol Use Disorders Identification Test", Guidelines for Use in Primary Care, Second Edition.  World Pharmacologist [facility]). Score between 0-7:  no or low risk or alcohol related problems. Score between 8-15:  moderate risk of alcohol related problems. Score between 16-19:  high risk of alcohol related problems. Score 20 or above:  warrants further diagnostic evaluation for alcohol dependence and treatment.   CLINICAL FACTORS:   Previous Psychiatric Diagnoses and Treatments Medical Diagnoses and Treatments/Surgeries   Musculoskeletal: Strength & Muscle Tone: within normal limits Gait & Station: normal Patient leans: N/A  Psychiatric Specialty Exam: Physical Exam  ROS  Blood pressure 115/68, pulse 87, temperature 98.3 F (36.8 C), temperature  source Oral, resp. rate 16, height 5\' 8"  (1.727 m), weight 116.574 kg (257 lb).Body mass index is 39.09 kg/(m^2).                    Please see H&P.                                      COGNITIVE FEATURES THAT CONTRIBUTE TO RISK:  Closed-mindedness, Polarized thinking and Thought constriction (tunnel vision)    SUICIDE RISK:   Moderate:  Frequent suicidal ideation with limited intensity, and duration, some specificity in terms of plans, no associated intent, good self-control, limited dysphoria/symptomatology, some risk factors present, and identifiable protective factors, including available and accessible social support.  PLAN OF CARE:Please see H&P.   Medical Decision Making:  Review of Psycho-Social Stressors (1), Review or order clinical lab tests (1), Review and summation of old records (2), Established Problem, Worsening (2), New Problem, with no additional work-up planned (3), Review or order medicine tests (1), Review of Medication Regimen & Side Effects (2) and Review of New Medication or Change in Dosage (2)  I certify that inpatient services furnished can reasonably be expected to improve the patient's condition.   Elva Breaker MD 11/02/2015, 12:46 PM

## 2015-11-02 NOTE — Progress Notes (Signed)
DAR note:   Pt has anxious affect and mood.  Pt reports his goal today was "working on having a better day."  Pt reports he was constipated, but that he has had a bowel movement.  He reports he has had diarrhea.  Writer offered to contact on-call provider and request PRN medication for diarrhea and pt declined.  Pt requested and was provided with diaper and pads for his bed.  He denies SI/HI, denies hallucinations, denies pain.  His wife and son visited tonight and he reports that it was "nice to see them."  Pt reports he attended group this morning.  He did not attend evening group.  Medications administered per order.  PRN medication administered for anxiety.  Met with pt and offered support and encouragement.  Actively listened to pt.  Provided pt with CPAP machine after he verbally contracted for safety with Probation officer.  He reports that he will inform staff of needs and concerns.  Will continue to monitor and assess.

## 2015-11-02 NOTE — Plan of Care (Signed)
Problem: Ineffective individual coping Goal: STG: Patient will remain free from self harm Outcome: Progressing No self harm reported or observed     

## 2015-11-02 NOTE — Progress Notes (Signed)
D. Pt presents with sad, flat affect. Minimal interaction with staff and peers. Does make needs known. Pt did not attend group due to feeling constipated he states. md notified, new orders received. Pt noted standing in hallways observing. Denies SI, Hi, AVH. Pt anxious earlier in shift, prn given with good relief.  A. Encouragement and support offered. Medications given as prescribed. R. Pt receptive, and remains safe on unit with q 15 min safety checks

## 2015-11-02 NOTE — H&P (Signed)
Psychiatric Admission Assessment Adult  Patient Identification: Brad Miller MRN:  [id number] Date of Evaluation:  11/02/2015 Chief Complaint:  Pt states " I was overwhelmed.'   Principal Diagnosis: MDD (major depressive disorder), recurrent severe, without psychosis ([place]) Diagnosis:   Patient Active Problem List   Diagnosis Date Noted  . MDD (major depressive disorder), recurrent severe, without psychosis ([place]) [F33.2] 11/02/2015  . Panic attacks [F41.0] 11/02/2015  . Cognitive disorder [F09] 11/02/2015  . Acute confusional state [F05] 10/31/2015  . 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 Arthritis of right knee [M12.9] 02/23/2014       History of Present Illness:: Brad Miller, 64 yo caucasian male who is married , employed , has a hx of depression and anxiety do was brought in to [facility] initially as a walk in on 10/31/2015 . Per initial notes in EHR " He also presented with the same symptoms and was a walk in on 10/30/2015. He was accompanied by his wife who was concerned greatly about his behavior. She states that in Nov 1, he lost his direction driving back home from [place]. 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 His wife described that he had sudden acute onset of loss of speech and a dazed appearance. He would rock back and forth and would have occasional shuffling gait. He was sent to ED for medical clearance.Brad Miller, his wife brought him back to [facility].Patient had similar presentations and was send to [facility] for medical/neurological clearance. Pt had MRI brain done - which showed no acute abnormality , but the study was  severely degraded by patient motion."  Patient seen and chart reviewed.Discussed patient with treatment team. Pt today seen as calm , cooperative and was able to give a lot of information about his current admission. Pt reports several psychosocial stressors like financial issues as well as a recent extramarital relationship which he  had and he discussed this with his wife recently. She did not take it well. He also was cheating his nephew in [place] , for whom he was working for , cheated on him monetarily . Pt currently has a lot of guilt and anxiety regarding his situation. Pt reports that he recently lost his way and was confused while he was driving from [place]. This has never happened before. Pt reports he felt like he had memory issues and got confused about the election date , but was able to recollect . Pt reports he has never felt this way before. 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 Pt reports some sleep issues on and off , due to anxiety issues. He has been having a feeling of worthlessness, guilt and anhedonia as well as sadness. Pt reports he was agitated in the ED and he does not know why it happened.  Pt denies any hx of panic attacks in the past , but he does have anxiety issues and he has been on Paxil and wellbutrin since the past several years . Pt reports Paxil was added for anxiety sx.  Pt denies hx of bipolar sx/PTSD sx.Pt denies any SI/HI/AH/VH.  Pt denies any hx of substance abuse.  Pt reports 1 prior admission at [facility] , when [facility] - for depression. At that time he was going through his first divorce.  Pt other than his most recent episode of confusion when he lost his way , denies any problems managing his bills ,  remembering faces or names or getting around in familiar places , or getting lost . Pt's father had some dementia like sx , onset more than 66 years old . He does not know for sure what kind of dementia that was.    Associated Signs/Symptoms: Depression Symptoms:  depressed mood, anhedonia, insomnia, hopelessness, impaired memory, anxiety, panic attacks, disturbed sleep, (Hypo) Manic Symptoms:  Impulsivity, Irritable Mood, Anxiety Symptoms:  Excessive Worry, Psychotic Symptoms:  denies PTSD  Symptoms: Negative Total Time spent with patient: 1 hour  Past Psychiatric History: Pt has a hx of depression as well as anxiety do . Pt has had one [facility] in the past [facility] for [facility]. Pt denies any suicide attempts.   Risk to Self: Is patient at risk for suicide?: No Risk to Others:   Prior Inpatient Therapy:   Prior Outpatient Therapy:    Alcohol Screening: 1. How often do you have a drink containing alcohol?: 2 to 4 times a month 2. How many drinks containing alcohol do you have on a typical day when you are drinking?: 1 or 2 3. How often do you have six or more drinks on one occasion?: Never Preliminary Score: 0 4. How often during the last year have you found that you were not able to stop drinking once you had started?: Never 5. How often during the last year have you failed to do what was normally expected from you becasue of drinking?: Never 6. How often during the last year have you needed a first drink in the morning to get yourself going after a heavy drinking session?: Never 7. How often during the last year have you had a feeling of guilt of remorse after drinking?: Never 8. How often during the last year have you been unable to remember what happened the night before because you had been drinking?: Never 9. Have you or someone else been injured as a result of your drinking?: No 10. Has a relative or friend or a doctor or another health worker been concerned about your drinking or suggested you cut down?: No Alcohol Use Disorder Identification Test Final Score (AUDIT): 2 Brief Intervention: AUDIT score less than 7 or less-screening does not suggest unhealthy drinking-brief intervention not indicated Substance Abuse History in the last 12 months:  Yes.  occasional use of alcohol socially Consequences of Substance Abuse: Negative Previous Psychotropic Medications: Yes - wellbutrin , paxil Psychological Evaluations: No  Past Medical History:  Past  Medical History  Diagnosis Date  . Hyperlipidemia     TAKES LOVASTATIN NIGHTLY  . Depression     TAKES WELLBUTRIN DAILY  . Anxiety     TAKES XANAX DAILY AS NEEDED  . Hypertension     TAKES CARDURA DAILY  . Sleep apnea     USES CPAP  . Headache(784.0)     OCCASIONALLY  . Arthritis   . Joint pain   . Urinary frequency   . Urinary urgency   . Enlarged prostate   . Diabetes mellitus without complication (HCC)     BORDERLINE  . Cataracts, bilateral   . Rash     AROUND NOSE AND STATES D/T NOT USING LOTION WHILE SHAVING    Past Surgical History  Procedure Laterality Date  . 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 Total knee arthroplasty Right 02/23/2014    Procedure: TOTAL KNEE ARTHROPLASTY;  Surgeon: Kerin Salen, MD;  Location: [employer];  Service: Orthopedics;  Laterality: Right;  . Knee arthroscopy Left  02/23/2014    Procedure: LEFT ARTHROSCOPY KNEE;  Surgeon: Kerin Salen, MD;  Location: [place];  Service: Orthopedics;  Laterality: Left;   Family History:  Family History  Problem Relation Age of Onset  . Hypertension Mother   . Heart attack Mother   . Osteoporosis Mother   . CVA Mother   . Coronary artery disease Mother   . Hypertension Father   . Emphysema Father   . Dementia Father   . Coronary artery disease Brother   . Diabetes Brother    Family Psychiatric  History: Pt has a hx of dementia in his family - father , onset at >89 years old. Father attempted suicide.  Social History: Pt is married , divorced once in the past. Pt is employed , but may have lost his job due to his recent issues. Pt has two children who are adults. Pt denies any legal issues. History  Alcohol Use  . 0.6 oz/week  . 1 Cans of beer per week    Comment: OCCASIONALLY     History  Drug Use No    Social History   Social History  . Marital Status: Married    Spouse Name: N/A  . Number of Children: N/A  . Years of Education: N/A   Social History Main Topics  . Smoking status: Never Smoker    . Smokeless tobacco: None  . Alcohol Use: 0.6 oz/week    1 Cans of beer per week     Comment: OCCASIONALLY  . Drug Use: No  . Sexual Activity: Not Currently   Other Topics Concern  . None   Social History Narrative   Additional Social History:    Pain Medications: denies Prescriptions: denies Over the Counter: denies History of alcohol / drug use?: No history of alcohol / drug abuse                    Allergies:   Allergies  Allergen Reactions  . Ciprofloxacin     GI side effects   . Hydrocodone-Acetaminophen     Itch/constipation  . Penicillins     Childhood allergy. Knot on arm Has patient had a PCN reaction causing immediate rash, facial/tongue/throat swelling, SOB or lightheadedness with hypotension: No Has patient had a PCN reaction causing severe rash involving mucus membranes or skin necrosis: No Has patient had a PCN reaction that required hospitalization No Has patient had a PCN reaction occurring within the last 10 years: No If all of the above answers are "NO", then may pro  . Zocor [Simvastatin]     Back aches    Lab Results:  Results for orders placed or performed during the hospital encounter of 10/31/15 (from the past 48 hour(s))  Comprehensive metabolic panel     Status: Abnormal   Collection Time: 10/31/15  3:21 PM  Result Value Ref Range   Sodium 137 135 - 145 mmol/L   Potassium 4.0 3.5 - 5.1 mmol/L   Chloride 101 101 - 111 mmol/L   CO2 24 22 - 32 mmol/L   Glucose, Bld 103 (H) 65 - 99 mg/dL   BUN 12 6 - 20 mg/dL   Creatinine, Ser 0.94 0.61 - 1.24 mg/dL   Calcium 9.7 8.9 - 10.3 mg/dL   Total Protein 7.0 6.5 - 8.1 g/dL   Albumin 3.8 3.5 - 5.0 g/dL   AST 15 15 - 41 U/L   ALT 39 17 - 63 U/L   Alkaline Phosphatase 70 38 -  126 U/L   Total Bilirubin 0.9 0.3 - 1.2 mg/dL   GFR calc non Af Amer >60 >60 mL/min   GFR calc Af Amer >60 >60 mL/min    Comment: (NOTE) The eGFR has been calculated using the CKD EPI equation. This calculation has  not been validated in all clinical situations. eGFR's persistently <60 mL/min signify possible Chronic Kidney Disease.    Anion gap 12 5 - 15  Ethanol     Status: None   Collection Time: 10/31/15  3:21 PM  Result Value Ref Range   Alcohol, Ethyl (B) <5 <5 mg/dL    Comment:        LOWEST DETECTABLE LIMIT FOR SERUM ALCOHOL IS 5 mg/dL FOR MEDICAL PURPOSES ONLY   CBC with Diff     Status: None   Collection Time: 10/31/15  3:21 PM  Result Value Ref Range   WBC 8.0 4.0 - 10.5 K/uL   RBC 5.04 4.22 - 5.81 MIL/uL   Hemoglobin 15.3 13.0 - 17.0 g/dL   HCT 45.4 39.0 - 52.0 %   MCV 90.1 78.0 - 100.0 fL   MCH 30.4 26.0 - 34.0 pg   MCHC 33.7 30.0 - 36.0 g/dL   RDW 13.1 11.5 - 15.5 %   Platelets 256 150 - 400 K/uL   Neutrophils Relative % 73 %   Neutro Abs 5.8 1.7 - 7.7 K/uL   Lymphocytes Relative 19 %   Lymphs Abs 1.5 0.7 - 4.0 K/uL   Monocytes Relative 7 %   Monocytes Absolute 0.6 0.1 - 1.0 K/uL   Eosinophils Relative 1 %   Eosinophils Absolute 0.1 0.0 - 0.7 K/uL   Basophils Relative 0 %   Basophils Absolute 0.0 0.0 - 0.1 K/uL  Urine rapid drug screen (hosp performed)not at [facility]     Status: None   Collection Time: 11/01/15  7:47 AM  Result Value Ref Range   Opiates NONE DETECTED NONE DETECTED   Cocaine NONE DETECTED NONE DETECTED   Benzodiazepines NONE DETECTED NONE DETECTED   Amphetamines NONE DETECTED NONE DETECTED   Tetrahydrocannabinol NONE DETECTED NONE DETECTED   Barbiturates NONE DETECTED NONE DETECTED    Comment:        DRUG SCREEN FOR MEDICAL PURPOSES ONLY.  IF CONFIRMATION IS NEEDED FOR ANY PURPOSE, NOTIFY LAB WITHIN 5 DAYS.        LOWEST DETECTABLE LIMITS FOR URINE DRUG SCREEN Drug Class       Cutoff (ng/mL) Amphetamine      1000 Barbiturate      200 Benzodiazepine   975 Tricyclics       883 Opiates          300 Cocaine          300 THC              50     Metabolic Disorder Labs:  No results found for: HGBA1C, MPG No results found for: PROLACTIN No  results found for: CHOL, TRIG, HDL, CHOLHDL, VLDL, LDLCALC  Current Medications: Current Facility-Administered Medications  Medication Dose Route Frequency Provider Last Rate Last Dose  . alum & mag hydroxide-simeth (MAALOX/MYLANTA) 200-200-20 MG/5ML suspension 30 mL  30 mL Oral Q4H PRN Kerrie Buffalo, NP      . buPROPion [facility] SR) 12 hr tablet 150 mg  150 mg Oral BID Kerrie Buffalo, NP   150 mg at 11/02/15 0801  . clonazePAM (KLONOPIN) tablet 1 mg  1 mg Oral BID PRN Kerrie Buffalo, NP  1 mg at 11/02/15 0801  . doxazosin (CARDURA) tablet 4 mg  4 mg Oral Daily Carmin Muskrat, MD   4 mg at 11/02/15 0801  . ibuprofen (ADVIL,MOTRIN) tablet 600 mg  600 mg Oral Q8H PRN Kerrie Buffalo, NP   600 mg at 11/02/15 0641  . magnesium hydroxide (MILK OF MAGNESIA) suspension 30 mL  30 mL Oral Daily PRN Kerrie Buffalo, NP   30 mL at 11/01/15 2206  . OLANZapine (ZYPREXA) tablet 5 mg  5 mg Oral Q8H PRN Ursula Alert, MD       Or  . OLANZapine (ZYPREXA) injection 5 mg  5 mg Intramuscular Q8H PRN Kenny Stern, MD      . sertraline (ZOLOFT) tablet 25 mg  25 mg Oral Daily Charyl Minervini, MD   25 mg at 11/02/15 1045  . traZODone (DESYREL) tablet 100 mg  100 mg Oral QHS Kerrie Buffalo, NP   100 mg at 11/01/15 2200   PTA Medications: Prescriptions prior to admission  Medication Sig Dispense Refill Last Dose  . acetaminophen (TYLENOL) 500 MG tablet Take 1,000 mg by mouth every 6 (six) hours as needed for mild pain or moderate pain.    over 30 days  . aspirin EC 325 MG tablet Take 1 tablet (325 mg total) by mouth 2 (two) times daily. (Patient not taking: Reported on 10/30/2015) 30 tablet 0 Not Taking at Unknown time  . buPROPion (WELLBUTRIN SR) 150 MG 12 hr tablet Take 150 mg by mouth 2 (two) times daily.   10/31/2015 at Unknown time  . Butalbital-APAP-Caffeine (FIORICET PO) Take 1-2 tablets by mouth every 4 (four) hours as needed (for headache).    over 30 days  . clonazePAM (KLONOPIN) 1 MG tablet Take 1 mg  by mouth 2 (two) times daily as needed for anxiety.   unknown  . doxazosin (CARDURA) 8 MG tablet Take 4 mg by mouth daily.   10/31/2015 at Unknown time  . ibuprofen (ADVIL,MOTRIN) 800 MG tablet Take 800 mg by mouth every 8 (eight) hours as needed for moderate pain.    over 30 days  . methocarbamol (ROBAXIN) 500 MG tablet Take 1 tablet (500 mg total) by mouth 2 (two) times daily with a meal. (Patient not taking: Reported on 10/30/2015) 60 tablet 0 Not Taking at Unknown time  . Omega-3 Fatty Acids (FISH OIL PO) Take 4 capsules by mouth every morning.   10/31/2015 at Unknown time  . oxyCODONE-acetaminophen (ROXICET) 5-325 MG per tablet Take 1 tablet by mouth every 4 (four) hours as needed. (Patient not taking: Reported on 10/30/2015) 60 tablet 0 Not Taking at Unknown time  . PARoxetine (PAXIL) 10 MG tablet Take 1 tablet by mouth daily.   10/31/2015 at Unknown time  . PARoxetine (PAXIL) 40 MG tablet Take 40 mg by mouth every morning.   10/31/2015 at Unknown time  . POTASSIUM PO Take 1 tablet by mouth daily.   10/31/2015 at Unknown time  . pyridOXINE (VITAMIN B-6) 50 MG tablet Take 50 mg by mouth every other day.    10/30/2015 at Unknown time  . sildenafil (VIAGRA) 50 MG tablet Take 25-50 mg by mouth daily as needed for erectile dysfunction.   unknown  . testosterone cypionate (DEPOTESTOTERONE CYPIONATE) 200 MG/ML injection Inject 300 mg into the muscle every 28 (twenty-eight) days.    10/24/2015  . traZODone (DESYREL) 100 MG tablet Take 100 mg by mouth at bedtime.   10/30/2015 at Unknown time  . TURMERIC PO Take 1 capsule by  mouth daily.   10/31/2015 at Unknown time  . vitamin B-12 (CYANOCOBALAMIN) 1000 MCG tablet Take 1,000 mcg by mouth daily.    10/31/2015 at Unknown time    Musculoskeletal: Strength & Muscle Tone: within normal limits Gait & Station: normal Patient leans: N/A  Psychiatric Specialty Exam: Physical Exam  Review of Systems  Psychiatric/Behavioral: Positive for depression. The patient  is nervous/anxious and has insomnia.   All other systems reviewed and are negative.   Blood pressure 115/68, pulse 87, temperature 98.3 F (36.8 C), temperature source Oral, resp. rate 16, height _0  (1.727 m), weight 116.574 kg (257 lb).Body mass index is 39.09 kg/(m^2).  General Appearance: Casual  Eye Contact::  Fair  Speech:  Clear and Coherent  Volume:  Normal  Mood:  Anxious and Depressed  Affect:  Appropriate  Thought Process:  Goal Directed  Orientation:  Full (Time, Place, and Person)  Thought Content:  Rumination  Suicidal Thoughts:  No  Homicidal Thoughts:  No  Memory:  Immediate;   Fair Recent;   Fair Remote;   Fair  Judgement:  Fair  Insight:  Fair  Psychomotor Activity:  Normal  Concentration:  Poor  Recall:  [employer] of Knowledge:Fair  Language: Fair  Akathisia:  No  Handed:  Right  AIMS (if indicated):     Assets:  Communication Skills Desire for Improvement  ADL's:  Intact  Cognition: Impaired,  Mild  Sleep:  Number of Hours: 4.5     Treatment Plan Summary: Daily contact with patient to assess and evaluate symptoms and progress in treatment and Medication management   Patient will benefit from inpatient treatment and stabilization.  Estimated length of stay is 5-7 days.  Reviewed past medical records,treatment plan.  Will continue Wellbutrin 150 mg po bid for affective sx- pt has been on it since the past several years. Will DC Paxil for lack of efficacy , start Zoloft 25 mg po daily for affective sx. Will continue Trazodone 100 mg po qhs for sleep. Add Zyprexa 5 mg po q8h prn for severe agitation. Continue Klonopin 1 mg po bid prn for anxiety sx.  Will continue to monitor vitals ,medication compliance and treatment side effects while patient is here.  Will monitor for medical issues as well as call consult as needed.  Reviewed labs , CBC-WNL  , CMP - WNL , UDS-negative, BAL<5 . MRI brain - negative, EKG - qtc wnl.will order TSH ,lipid panel,  hba1c, Vitamin b12, folate , RPR for cognitive issues. Will get more collateral information from family. CSW will start working on disposition.  Patient to participate in therapeutic milieu .       Observation Level/Precautions:  Fall 15 minute checks    Psychotherapy:  Individual and group therapy     Consultations:  Social worker  Discharge Concerns:stability and safety         I certify that inpatient services furnished can reasonably be expected to improve the patient's condition.   Orlondo Holycross MD 11/12/20161:19 PM

## 2015-11-02 NOTE — Plan of Care (Signed)
Problem: Diagnosis: Increased Risk For Suicide Attempt Goal: STG-Patient Will Comply With Medication Regime Outcome: Progressing Pt has been compliant with medications tonight.       

## 2015-11-02 NOTE — Progress Notes (Signed)
Did not attend group 

## 2015-11-02 NOTE — [facility] Group Notes (Signed)
[facility] Group Notes:  (Clinical Social Work)  11/02/2015  11:15-12:00PM  Summary of Progress/Problems:   Today's process group involved patients discussing their feelings related to being hospitalized, as well as how they want to feel in order to be ready to discharge.  It was agreed in general by the group that it would be preferable to avoid future hospitalizations, and there was a discussion about what each person will need to do to achieve that.   Problems related to adherence to medication recommendations were discussed, as well as importance of developing friendships and supports.  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  Type of Therapy:  Group Therapy - Process  Participation Level:  Active  Participation Quality:  Attentive  Affect:  Depressed and Flat  Cognitive:  Appropriate  Insight:  Developing/Improving  Engagement in Therapy:  Engaged  Modes of Intervention:  Exploration, Discussion  Selmer Dominion, LCSW 11/02/2015, 1:16 PM

## 2015-11-03 LAB — LIPID PANEL
CHOL/HDL RATIO: 5.1 ratio
Cholesterol: 168 mg/dL (ref 0–200)
HDL: 33 mg/dL — AB (ref >40)
LDL Cholesterol: 106 mg/dL — ABNORMAL HIGH (ref 0–99)
TRIGLYCERIDES: 143 mg/dL (ref <150)
VLDL: 29 mg/dL (ref 0–40)

## 2015-11-03 LAB — FOLATE: Folate: 11.7 ng/mL (ref >5.9)

## 2015-11-03 LAB — TSH: TSH: 1.523 u[IU]/mL (ref 0.350–4.500)

## 2015-11-03 LAB — RPR: RPR Ser Ql: NONREACTIVE

## 2015-11-03 LAB — VITAMIN B12: Vitamin B-12: 2074 pg/mL — ABNORMAL HIGH (ref 180–914)

## 2015-11-03 MED ORDER — CLONAZEPAM 0.5 MG PO TABS
0.5000 mg | ORAL_TABLET | Freq: Two times a day (BID) | ORAL | Status: DC | PRN
  Administered 2015-11-03 – 2015-11-04 (×2): 0.5 mg via ORAL
  Filled 2015-11-03 (×2): qty 1

## 2015-11-03 NOTE — Progress Notes (Signed)
[facility] MD Progress Note  11/03/2015 12:45 PM Brad Miller  MRN:  [id number] Subjective: Patient states " I am still a bit anxious today. I did not have any more episodes of being confused.'  Objective:Brad Miller, 64 yo caucasian male who is married , employed , has a hx of depression and anxiety do was brought in to [facility] initially as a walk in on 10/31/2015 . Per initial notes in EHR " He also presented with the same symptoms and was a walk in on 10/30/2015. He was accompanied by his wife who was concerned greatly about his behavior. She states that in Nov 1, he lost his direction driving back home from [place]. 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 His wife described that he had sudden acute onset of loss of speech and a dazed appearance. He would rock back and forth and would have occasional shuffling gait. He was sent to ED for medical clearance. Patient was cleared by Neurology team and was referred to [facility] for further recommendations.   Patient seen and chart reviewed.Discussed patient with treatment team.  Patient today seen as calm , cooperative , alert , oriented x4. Pt today able to do serial 3's ,his attention and concentration seems to be wnl and his memory seems to be intact. Pt was also able to draw a clock , well , with all the numbers in it and the right time as 10 minutes to 2. Pt denies any new concerns. Pt has been tolerating his medications well. Pt reports sleep as improved. Pt reports anxiety as well as depression as improving , tolerating his medications well. Pt denies SI/HI/AH/VH.  Collateral information was obtained from Wife Brad Miller at [id number] - Per wife - pt has been married to her since the past 23 years. Pt was in his normal state of health until a month ago. 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 He stayed there for two  months, but could not function well. He would get his measurements wrong and could not focus well. Pt would also go and visit his mother and cry for hours. Pt returned to [facility] on November 1st. He came till [facility] , drove himself , like he always does , but lost his way and his son had to go and get him from a church parking lot. Pt after coming home appeared to be anxious , restless and thereafter stopped talking and walking. Per his wife , he has never acted this way before. He has a hx of MDD, was admitted at [facility] several years ago after his first divorce. Pt was following up with [facility] physician for his depression recently and they had [facility] , but he could not get in there due to insurance issues. Pt per wife was acting out in the ED , she has never seen him this way before and feels she is not yet ready to take him home , since she feels he will hurt some one. Reassured wife, gave an update about pt , discussed that pt has had no outbursts or confusion since being admitted at [facility].        Principal Problem: MDD (major depressive disorder), recurrent severe, without psychosis ([place])  R/O Functional neurological symptom disorder      Diagnosis:   Patient Active Problem List   Diagnosis Date Noted  . MDD (major depressive disorder), recurrent severe, without psychosis ([place]) [F33.2] 11/02/2015  . Panic attacks [F41.0] 11/02/2015  . Acute confusional state [F05] 10/31/2015  . 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 Arthritis of right knee [M12.9] 02/23/2014   Total Time spent with patient: 45 minutes  Past Psychiatric History: Pt has a hx of depression as well as anxiety do . Pt has had one [facility] in the past [facility]. Pt denies any suicide attempts  Past Medical History:  Past Medical History  Diagnosis Date  . Hyperlipidemia     TAKES LOVASTATIN  NIGHTLY  . Depression     TAKES WELLBUTRIN DAILY  . Anxiety     TAKES XANAX DAILY AS NEEDED  . Hypertension     TAKES CARDURA DAILY  . Sleep apnea     USES CPAP  . Headache(784.0)     OCCASIONALLY  . Arthritis   . Joint pain   . Urinary frequency   . Urinary urgency   . Enlarged prostate   . Diabetes mellitus without complication (HCC)     BORDERLINE  . Cataracts, bilateral   . Rash     AROUND NOSE AND STATES D/T NOT USING LOTION WHILE SHAVING    Past Surgical History  Procedure Laterality Date  . 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 Total knee arthroplasty Right 02/23/2014    Procedure: TOTAL KNEE ARTHROPLASTY;  Surgeon: Kerin Salen, MD;  Location: [facility];  Service: Orthopedics;  Laterality: Right;  . Knee arthroscopy Left 02/23/2014    Procedure: LEFT ARTHROSCOPY KNEE;  Surgeon: Kerin Salen, MD;  Location: [facility];  Service: Orthopedics;  Laterality: Left;   Family History:  Family History  Problem Relation Age of Onset  . Hypertension Mother   . Heart attack Mother   . Osteoporosis Mother   . CVA Mother   . Coronary artery disease Mother   . Hypertension Father   . Emphysema Father   . Dementia Father   . Coronary artery disease Brother   . Diabetes Brother    Family Psychiatric  History:  Pt has a hx of dementia in his family - father , onset at >65 years old. Father attempted suicide.  Social History: Pt is married , divorced once in the past. Pt is employed , but may have lost his job due to his recent issues. Pt has two children who are adults. Pt denies any legal issues. History        History  Alcohol Use  . 0.6 oz/week  . 1 Cans of beer per week    Comment: OCCASIONALLY     History  Drug Use No    Social History   Social History  . Marital Status: Married    Spouse Name: N/A  . Number of Children: N/A  . Years of Education: N/A   Social History Main Topics  . Smoking status: Never Smoker   . Smokeless tobacco: None  . Alcohol Use: 0.6  oz/week    1 Cans of beer per week     Comment: OCCASIONALLY  . Drug Use: No  . Sexual Activity: Not Currently   Other Topics Concern  . None   Social History Narrative   Additional Social History:    Pain Medications: denies Prescriptions: denies  Over the Counter: denies History of alcohol / drug use?: No history of alcohol / drug abuse                    Sleep: Fair  Appetite:  Fair  Current Medications: Current Facility-Administered Medications  Medication Dose Route Frequency Provider Last Rate Last Dose  . alum & mag hydroxide-simeth (MAALOX/MYLANTA) 200-200-20 MG/5ML suspension 30 mL  30 mL Oral Q4H PRN Kerrie Buffalo, NP      . buPROPion [facility] SR) 12 hr tablet 150 mg  150 mg Oral BID Kerrie Buffalo, NP   150 mg at 11/03/15 0815  . clonazePAM (KLONOPIN) tablet 0.5 mg  0.5 mg Oral BID PRN Ursula Alert, MD      . doxazosin (CARDURA) tablet 4 mg  4 mg Oral Daily Carmin Muskrat, MD   4 mg at 11/03/15 0815  . ibuprofen (ADVIL,MOTRIN) tablet 600 mg  600 mg Oral Q8H PRN Kerrie Buffalo, NP   600 mg at 11/02/15 0641  . magnesium hydroxide (MILK OF MAGNESIA) suspension 30 mL  30 mL Oral Daily PRN Kerrie Buffalo, NP   30 mL at 11/01/15 2206  . OLANZapine (ZYPREXA) tablet 5 mg  5 mg Oral Q8H PRN Ursula Alert, MD       Or  . OLANZapine (ZYPREXA) injection 5 mg  5 mg Intramuscular Q8H PRN Aysha Livecchi, MD      . sertraline (ZOLOFT) tablet 25 mg  25 mg Oral Daily Ursula Alert, MD   25 mg at 11/03/15 0815  . traZODone (DESYREL) tablet 100 mg  100 mg Oral QHS Kerrie Buffalo, NP   100 mg at 11/02/15 2100    Lab Results:  Results for orders placed or performed during the hospital encounter of 11/01/15 (from the past 48 hour(s))  TSH     Status: None   Collection Time: 11/03/15  6:55 AM  Result Value Ref Range   TSH 1.523 0.350 - 4.500 uIU/mL    Comment: Performed at [facility]  Lipid panel     Status: Abnormal   Collection Time: 11/03/15   6:55 AM  Result Value Ref Range   Cholesterol 168 0 - 200 mg/dL   Triglycerides 143 <150 mg/dL   HDL 33 (L) >40 mg/dL   Total CHOL/HDL Ratio 5.1 RATIO   VLDL 29 0 - 40 mg/dL   LDL Cholesterol 106 (H) 0 - 99 mg/dL    Comment:        Total Cholesterol/HDL:CHD Risk Coronary Heart Disease Risk Table                     Men   Women  1/2 Average Risk   3.4   3.3  Average Risk       5.0   4.4  2 X Average Risk   9.6   7.1  3 X Average Risk  23.4   11.0        Use the calculated Patient Ratio above and the CHD Risk Table to determine the patient's CHD Risk.        ATP III CLASSIFICATION (LDL):  <100     mg/dL   Optimal  100-129  mg/dL   Near or Above                    Optimal  130-159  mg/dL   Borderline  160-189  mg/dL   High  >190     mg/dL  Very High Performed at [facility]   Vitamin B12     Status: Abnormal   Collection Time: 11/03/15  6:55 AM  Result Value Ref Range   Vitamin B-12 2074 (H) 180 - 914 pg/mL    Comment: (NOTE) This assay is not validated for testing neonatal or myeloproliferative syndrome specimens for Vitamin B12 levels. Performed at [facility]   Folate     Status: None   Collection Time: 11/03/15  6:55 AM  Result Value Ref Range   Folate 11.7 >5.9 ng/mL    Comment: Performed at [facility]    Physical Findings: AIMS: Facial and Oral Movements Muscles of Facial Expression: None, normal Lips and Perioral Area: None, normal Jaw: None, normal Tongue: None, normal,Extremity Movements Upper (arms, wrists, hands, fingers): None, normal Lower (legs, knees, ankles, toes): None, normal, Trunk Movements Neck, shoulders, hips: None, normal, Overall Severity Severity of abnormal movements (highest score from questions above): None, normal Incapacitation due to abnormal movements: None, normal Patient's awareness of abnormal movements (rate only patient's report): No Awareness, Dental Status Current problems with teeth and/or  dentures?: No Does patient usually wear dentures?: No  CIWA:  CIWA-Ar Total: 1 COWS:  COWS Total Score: 1  Musculoskeletal: Strength & Muscle Tone: within normal limits Gait & Station: normal Patient leans: N/A  Psychiatric Specialty Exam: Review of Systems  Psychiatric/Behavioral: Positive for depression. The patient is nervous/anxious.   All other systems reviewed and are negative.   Blood pressure 112/62, pulse 100, temperature 98.3 F (36.8 C), temperature source Oral, resp. rate 16, height 5\' 8"  (1.727 m), weight 116.574 kg (257 lb).Body mass index is 39.09 kg/(m^2).  General Appearance: Fairly Groomed  Engineer, water::  Fair  Speech:  Normal Rate  Volume:  Normal  Mood:  Anxious and Depressed  Affect:  Congruent  Thought Process:  Coherent  Orientation:  Full (Time, Place, and Person)  Thought Content:  Rumination  Suicidal Thoughts:  No  Homicidal Thoughts:  No  Memory:  Immediate;   Fair Recent;   Fair Remote;   Fair  Judgement:  Fair  Insight:  Fair  Psychomotor Activity:  Restlessness  Concentration:  Poor  Recall:  [employer] of Knowledge:Fair  Language: Fair  Akathisia:  No  Handed:  Right  AIMS (if indicated):     Assets:  Communication Skills Desire for Improvement  ADL's:  Intact  Cognition: WNL  Sleep:  Number of Hours: 4.5   Treatment Plan Summary:Patient likely had severe anxiety as well as depressive sx , likely his neurological sx might have been 2/2 the same. Will observe on the unit and treat. Daily contact with patient to assess and evaluate symptoms and progress in treatment and Medication management   Will continue Wellbutrin 150 mg po bid for affective sx- pt has been on it since the past several years. Will continue Zoloft 25 mg po daily for affective sx. Will continue Trazodone 100 mg po qhs for sleep. Will continue Zyprexa 5 mg po q8h prn for severe agitation. Reduce Klonopin to 0.5 mg po bid prn for anxiety sx.  Will continue to  monitor vitals ,medication compliance and treatment side effects while patient is here.  Will monitor for medical issues as well as call consult as needed.  Reviewed labs TSH- wnl  ,lipid panel- dyslipidemia ( place dietician consult ) Vit B12 - elevated , folate - wnl , pending RPR. MRI brain - Pt had MRI brain done - which showed  no acute abnormality , but the study was severely degraded by patient motion."Neurology team cleared patient while in ED.  CSW will start working on disposition. Pt to be referred for CBT once discharged. CSW to work on this . Patient to participate in therapeutic milieu .      Maat Kafer MD 11/03/2015, 12:45 PM

## 2015-11-03 NOTE — BHH Group Notes (Signed)
[place] Group Notes:  (Nursing/MHT/Case Management/Adjunct)  Date:  11/03/2015  Time:  11:34 AM  Type of Therapy:  Nurse Education  Participation Level:  Minimal  Participation Quality:  Attentive  Affect:  Blunted  Cognitive:  Alert and Oriented  Insight:  Limited  Engagement in Group:  Engaged  Modes of Intervention:  Discussion and Education  Summary of Progress/Problems:  Group topic today was [employer].  Discussed healthy and not healthy support people.  Discussed making a list of healthy support people and utilize during times of stress.  Brad Miller was attentive during the group but was reluctant to share.  Encouraged him to seek out staff if needed.    Barbette Or Jazz Biddy 11/03/2015, 11:34 AM

## 2015-11-03 NOTE — BHH Group Notes (Signed)
[place] Group Notes:  (Clinical Social Work)  11/03/2015  [place] Group Notes:  (Clinical Social Work)  11/03/2015  11:00AM-12:00PM  Summary of Progress/Problems:  The main focus of today's process group was to listen to a variety of genres of music and to identify that different types of music provoke different responses.  The patient then was able to identify personally what was soothing for them, as well as energizing.  Handouts were used to record feelings evoked, as well as how patient can personally use this knowledge in sleep habits, with depression, and with other symptoms.  The patient expressed understanding of concepts, as well as knowledge of how each type of music affected him and how this can be used at home as a wellness/recovery tool.  He was well able to state his feelings in reaction to each song.  Type of Therapy:  Music Therapy   Participation Level:  Active  Participation Quality:  Attentive and Sharing  Affect:  Blunted  Cognitive:  Oriented  Insight:  Engaged  Engagement in Therapy:  Engaged  Modes of Intervention:   Activity, Exploration  Selmer Dominion, LCSW 11/03/2015

## 2015-11-03 NOTE — Plan of Care (Signed)
Problem: Ineffective individual coping Goal: STG:Pt. will utilize relaxation techniques to reduce stress STG: Patient will utilize relaxation techniques to reduce stress levels  Outcome: Progressing Talked with Brad Miller about using coping skills because he stated that he was having problems with a peer "getting on my nerves."  We discussed going to quiet room or to his room to have time in silence.  He has agreed to come and tell staff that he needs to talk.  We will continue to monitor the progress towards his goals.

## 2015-11-03 NOTE — BHH Counselor (Signed)
Adult Comprehensive Assessment  Patient ID: Brad Miller, male   DOB: Oct 30, 1951, 64 y.o.   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  Pt reports this is a good environment.  How long has patient lived in current situation?: 25 years What is atmosphere in current home: Supportive, Loving, Comfortable  Family History:  Marital status: Married Number of Years Married: 49 What types of issues is patient dealing with in the relationship?: pt reports his wife is supportive, no issues reported Additional relationship information: N/A Does patient have children?: Yes How many children?: 2 How is patient's relationship with their children?: adults - reports a good relationship with them  Childhood History:  By whom was/is the patient raised?: Both parents Additional childhood history information: pt reports having a "fairly happy" childhood Description of patient's relationship with caregiver when they were a child: pt reports getting along well with parents growing up Patient's description of current relationship with people who raised him/her: father is deceased, mother is in a nursing home in [place] Does patient have siblings?: Yes Number of Siblings: 3 Description of patient's current relationship with siblings: pt reports being distant from 2 siblings, close to one brother Did patient suffer any verbal/emotional/physical/sexual abuse as a child?: No Did patient suffer from severe childhood neglect?: No Has patient ever been sexually abused/assaulted/raped as an adolescent or adult?: No Was the patient ever a victim of a crime or a disaster?: No Witnessed  domestic violence?: No Has patient been effected by domestic violence as an adult?: No  Education:  Highest grade of school patient has completed: bachelor's degree in Garment/textile technologist Currently a student?: No Learning disability?: No  Employment/Work Situation:   Employment situation: Unemployed Patient's job has been impacted by current illness: No 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 Where was the patient employed at that time?: 10 years Has patient ever been in the [employer]?: No Has patient ever served in Recruitment consultant?: No  Financial Resources:   Museum/gallery curator resources: No income, Multimedia programmer Does patient have a Programmer, applications or guardian?: No  Alcohol/Substance Abuse:   What has been your use of drugs/alcohol within the last 12 months?: pt denies If attempted suicide, did drugs/alcohol play a role in this?: No Alcohol/Substance Abuse Treatment Hx: Denies past history Has alcohol/substance abuse ever caused legal problems?: No  Social Support System:   Pensions consultant Support System: Good Describe Community Support System: pt reports his wife, brother and children are supportive Type of faith/religion: Darrick Meigs How does patient's faith help to cope with current illness?: prayer  Leisure/Recreation:   Leisure and Hobbies: pt denies having any hobbies right now due to depression  Strengths/Needs:   What things does the patient do well?: hard worker In what areas does patient struggle / problems for patient: depression  Discharge Plan:   Does patient have access to transportation?: Yes Will patient be returning to same living situation after discharge?: Yes Currently receiving community mental health services: No If no, would patient like referral for services when discharged?: Yes (What county?) ([place]) Does patient have financial barriers related to discharge medications?: No  Summary/Recommendations:     Patient is a 64 year  old Caucasian Male with a  diagnosis of Major depressive disorder, Recurrent episode, Moderate.  Patient lives in [place] with his wife.  Pt reports being depressed for some time and thinking his antidepressant prescribed by his primary doctor aren't working anymore.  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  Pt denies ever seeing a psychiatrist or therapist and is open to any referrals.  Pt has [employer] for referral purposes.  Patient will benefit from crisis stabilization, medication evaluation, group therapy and psycho education in addition to case management for discharge planning. Discharge Process and Patient Expectations information sheet signed by patient, witnessed by writer and inserted in patient's shadow chart.    Pt denies he is a smoker at this time so [place] Quitline N/A.    [place], [place] 11/03/2015

## 2015-11-03 NOTE — Progress Notes (Signed)
Brad Miller has been visible on the unit.  He is very pleasant and cooperative.  He is alert and oriented X 3.  He denies any SI/HI or A/V hallucinations.  He reports that he feels much better "I think."  He completed his self inventory and reports that his depression was 3/10, hopelessness 2/10 and anxiety 1/10.  He reports that his goal for today is to "improve self and to leave."  He has been attending groups.  He talked with Probation officer after group and mentioned that he was nervous about being around some of his peers.  "I feel like some people are really getting on my nerves."  Talked with him about what he can do when he feels like people are getting on his nerves.  Offered quiet room, sitting quietly in his room or seeking out staff.  He felt that going to the quiet room would be to much and is planning on just going to his room when people are bothering him.  Encouraged him to talk with the doctor about what he is feeling.  He had a Stage manager with him today and he was paranoid about her qualifications.  After speaking and reassuring him that he can tell her anything and the information will be safe.  He was able to talk with her without difficulty.  Encouraged participation in group/unit activities.  Q 15 minute checks maintained for safety.  We will continue to monitor the progress towards his goals.

## 2015-11-04 DIAGNOSIS — F332 Major depressive disorder, recurrent severe without psychotic features: Principal | ICD-10-CM

## 2015-11-04 LAB — HEMOGLOBIN A1C
HEMOGLOBIN A1C: 5.9 % — AB (ref 4.8–5.6)
Mean Plasma Glucose: 123 mg/dL

## 2015-11-04 MED ORDER — TRAZODONE HCL 100 MG PO TABS
100.0000 mg | ORAL_TABLET | Freq: Every evening | ORAL | Status: DC | PRN
  Administered 2015-11-04 – 2015-11-05 (×2): 100 mg via ORAL
  Filled 2015-11-04 (×2): qty 1

## 2015-11-04 NOTE — Progress Notes (Signed)
Adult Psychoeducational Group Note  Date:  11/04/2015 Time:  9:32 PM  Group Topic/Focus:  Wrap-Up Group:   The focus of this group is to help patients review their daily goal of treatment and discuss progress on daily workbooks.  Participation Level:  Active  Participation Quality:  Appropriate  Affect:  Appropriate  Cognitive:  Appropriate  Insight: Appropriate  Engagement in Group:  Engaged  Modes of Intervention:  Discussion  Additional Comments: The patient express that he attended group.The patient also said that he enjoyed group.  Brad Miller 11/04/2015, 9:32 PM

## 2015-11-04 NOTE — Progress Notes (Signed)
Adult Psychoeducational Group Note  Date:  11/04/2015 Time:  1:12 AM  Group Topic/Focus:  Wrap-Up Group:   The focus of this group is to help patients review their daily goal of treatment and discuss progress on daily workbooks.  Participation Level:  Active  Participation Quality:  Appropriate and Attentive  Affect:  Appropriate  Cognitive:  Alert and Appropriate  Insight: Appropriate and Good  Engagement in Group:  Engaged  Modes of Intervention:  Education  Additional Comments:  Pt stated he overall had a good day. Pt plans to use the coping skills he learned today to help him in the future.   Brad Miller 11/04/2015, 1:12 AM

## 2015-11-04 NOTE — BHH Group Notes (Signed)
[place] Group Notes:  (Counselor/Nursing/MHT/Case Management/Adjunct)  11/04/2015 1:15PM  Type of Therapy:  Group Therapy  Participation Level:  Active  Participation Quality:  Appropriate  Affect:  Flat  Cognitive:  Oriented  Insight:  Improving  Engagement in Group:  Limited  Engagement in Therapy:  Limited  Modes of Intervention:  Discussion, Exploration and Socialization  Summary of Progress/Problems: The topic for group was balance in life.  Pt participated in the discussion about when their life was in balance and out of balance and how this feels.  Pt discussed ways to get back in balance and short term goals they can work on to get where they want to be. Was in the hall prior to group.  Intent on talking to his nurse.  Declined to come to group until he could talk to her.  Walked in at the end of group.   Trish Mage 11/04/2015 3:53 PM

## 2015-11-04 NOTE — Progress Notes (Signed)
Patient ID: Brad Miller, male   DOB: 28-Apr-1951, 65 y.o.   MRN: [id number]  [facility] MD Progress Note  11/04/2015 5:13 PM Alyssa Mancera  MRN:  [id number] Subjective: Patient states he is depressed and anxious, which he states is due to concern about his episodes of confusion and also about concern about his finances. At this time denies medication side effects.  Objective:I  have reviewed chart notes and have met with patient. Also, with patient's express consent  And in his presence, I have spoken with his wife on the phone, who provided collateral information. Patient reports feeling depressed, but denies any suicidal ideations . Also denies any psychotic symptoms He denies medication side effects at present . Patient and wife describe episodes of confusion and having difficulty understanding issues the way he used to. Wife states that she has noted some degree of cognitive decline over  Several months. Patient denies any drug or alcohol abuse, and wife corroborates there is no known history of substance abuse . Report is that patient has been quite depressed related partly to finances, patient's mother being in a NH. Wife has noted that patient has been more labile and easily agitated than in the past . At this time patient presents with a blunted , constricted affect . Some visibility on unit, group participation, although tends to keep to self, limited interaction with others . Labs unremarkable except for elevated Vitamin B 12 level   Principal Problem: MDD (major depressive disorder), recurrent severe, without psychosis ([place])                                   R/O Functional neurological symptom disorder      Diagnosis:   Patient Active Problem List   Diagnosis Date Noted  . MDD (major depressive disorder), recurrent severe, without psychosis ([place]) [F33.2] 11/02/2015  . Panic attacks [F41.0] 11/02/2015  . Acute confusional state [F05] 10/31/2015  . Arthritis of knee, right [M12.9]  02/23/2014    Class: Chronic  . Arthritis of right knee [M12.9] 02/23/2014   Total Time spent with patient:  25 minutes   Past Psychiatric History: Pt has a hx of depression as well as anxiety do . [facility]. Pt denies any suicide attempts  Past Medical History:  Past Medical History  Diagnosis Date  . Hyperlipidemia     TAKES LOVASTATIN NIGHTLY  . Depression     TAKES WELLBUTRIN DAILY  . Anxiety     TAKES XANAX DAILY AS NEEDED  . Hypertension     TAKES CARDURA DAILY  . Sleep apnea     USES CPAP  . Headache(784.0)     OCCASIONALLY  . Arthritis   . Joint pain   . Urinary frequency   . Urinary urgency   . Enlarged prostate   . Diabetes mellitus without complication (HCC)     BORDERLINE  . Cataracts, bilateral   . Rash     AROUND NOSE AND STATES D/T NOT USING LOTION WHILE SHAVING    Past Surgical History  Procedure Laterality Date  . 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 Total knee arthroplasty Right 02/23/2014    Procedure: TOTAL KNEE ARTHROPLASTY;  Surgeon: Kerin Salen, MD;  Location: [place];  Service: Orthopedics;  Laterality: Right;  . Knee arthroscopy Left 02/23/2014    Procedure: LEFT ARTHROSCOPY KNEE;  Surgeon:  Kerin Salen, MD;  Location: [place];  Service: Orthopedics;  Laterality: Left;   Family History:  Family History  Problem Relation Age of Onset  . Hypertension Mother   . Heart attack Mother   . Osteoporosis Mother   . CVA Mother   . Coronary artery disease Mother   . Hypertension Father   . Emphysema Father   . Dementia Father   . Coronary artery disease Brother   . Diabetes Brother    Family Psychiatric  History:  Pt has a hx of dementia in his family - father , onset at >80 years old. Father attempted suicide.  Social History: Pt is married , divorced once in the past. Pt is employed , but may have lost his job due to his recent issues. Pt has two children who are adults. Pt denies  any legal issues. History        History  Alcohol Use  . 0.6 oz/week  . 1 Cans of beer per week    Comment: OCCASIONALLY     History  Drug Use No    Social History   Social History  . Marital Status: Married    Spouse Name: N/A  . Number of Children: N/A  . Years of Education: N/A   Social History Main Topics  . Smoking status: Never Smoker   . Smokeless tobacco: None  . Alcohol Use: 0.6 oz/week    1 Cans of beer per week     Comment: OCCASIONALLY  . Drug Use: No  . Sexual Activity: Not Currently   Other Topics Concern  . None   Social History Narrative   Additional Social History:    Pain Medications: denies Prescriptions: denies Over the Counter: denies History of alcohol / drug use?: No history of alcohol / drug abuse  Sleep:  improved  Appetite:  Fair  Current Medications: Current Facility-Administered Medications  Medication Dose Route Frequency Provider Last Rate Last Dose  . alum & mag hydroxide-simeth (MAALOX/MYLANTA) 200-200-20 MG/5ML suspension 30 mL  30 mL Oral Q4H PRN Kerrie Buffalo, NP      . buPROPion [facility] SR) 12 hr tablet 150 mg  150 mg Oral BID Kerrie Buffalo, NP   150 mg at 11/04/15 1636  . clonazePAM (KLONOPIN) tablet 0.5 mg  0.5 mg Oral BID PRN Ursula Alert, MD   0.5 mg at 11/03/15 2153  . doxazosin (CARDURA) tablet 4 mg  4 mg Oral Daily Carmin Muskrat, MD   4 mg at 11/04/15 0743  . ibuprofen (ADVIL,MOTRIN) tablet 600 mg  600 mg Oral Q8H PRN Kerrie Buffalo, NP   600 mg at 11/03/15 2154  . magnesium hydroxide (MILK OF MAGNESIA) suspension 30 mL  30 mL Oral Daily PRN Kerrie Buffalo, NP   30 mL at 11/01/15 2206  . OLANZapine (ZYPREXA) tablet 5 mg  5 mg Oral Q8H PRN Ursula Alert, MD       Or  . OLANZapine (ZYPREXA) injection 5 mg  5 mg Intramuscular Q8H PRN Saramma Eappen, MD      . sertraline (ZOLOFT) tablet 25 mg  25 mg Oral Daily Ursula Alert, MD   25 mg at 11/04/15 0743  . traZODone (DESYREL) tablet 100 mg  100 mg Oral QHS  Kerrie Buffalo, NP   100 mg at 11/03/15 2153    Lab Results:  Results for orders placed or performed during the hospital encounter of 11/01/15 (from the past 48 hour(s))  TSH     Status: None  Collection Time: 11/03/15  6:55 AM  Result Value Ref Range   TSH 1.523 0.350 - 4.500 uIU/mL    Comment: Performed at [facility]  Lipid panel     Status: Abnormal   Collection Time: 11/03/15  6:55 AM  Result Value Ref Range   Cholesterol 168 0 - 200 mg/dL   Triglycerides 143 <150 mg/dL   HDL 33 (L) >40 mg/dL   Total CHOL/HDL Ratio 5.1 RATIO   VLDL 29 0 - 40 mg/dL   LDL Cholesterol 106 (H) 0 - 99 mg/dL    Comment:        Total Cholesterol/HDL:CHD Risk Coronary Heart Disease Risk Table                     Men   Women  1/2 Average Risk   3.4   3.3  Average Risk       5.0   4.4  2 X Average Risk   9.6   7.1  3 X Average Risk  23.4   11.0        Use the calculated Patient Ratio above and the CHD Risk Table to determine the patient's CHD Risk.        ATP III CLASSIFICATION (LDL):  <100     mg/dL   Optimal  100-129  mg/dL   Near or Above                    Optimal  130-159  mg/dL   Borderline  160-189  mg/dL   High  >190     mg/dL   Very High Performed at [facility]   Hemoglobin A1c     Status: Abnormal   Collection Time: 11/03/15  6:55 AM  Result Value Ref Range   Hgb A1c MFr Bld 5.9 (H) 4.8 - 5.6 %    Comment: (NOTE)         Pre-diabetes: 5.7 - 6.4         Diabetes: >6.4         Glycemic control for adults with diabetes: <7.0    Mean Plasma Glucose 123 mg/dL    Comment: (NOTE) Performed At: [facility] [place], [place] [id number] Lindon Romp MD [id number] Performed at [facility]   Vitamin B12     Status: Abnormal   Collection Time: 11/03/15  6:55 AM  Result Value Ref Range   Vitamin B-12 2074 (H) 180 - 914 pg/mL    Comment: (NOTE) This assay is not validated for testing neonatal  or myeloproliferative syndrome specimens for Vitamin B12 levels. Performed at [facility]   Folate     Status: None   Collection Time: 11/03/15  6:55 AM  Result Value Ref Range   Folate 11.7 >5.9 ng/mL    Comment: Performed at [facility]  RPR     Status: None   Collection Time: 11/03/15  6:55 AM  Result Value Ref Range   RPR Ser Ql Non Reactive Non Reactive    Comment: (NOTE) Performed At: [facility] [place], [place] [id number] Lindon Romp MD [id number] Performed at [facility]     Physical Findings: AIMS: Facial and Oral Movements Muscles of Facial Expression: None, normal Lips and Perioral Area: None, normal Jaw: None, normal Tongue: None, normal,Extremity Movements Upper (arms, wrists, hands, fingers): None, normal Lower (legs, knees, ankles, toes): None, normal, Trunk Movements  Neck, shoulders, hips: None, normal, Overall Severity Severity of abnormal movements (highest score from questions above): None, normal Incapacitation due to abnormal movements: None, normal Patient's awareness of abnormal movements (rate only patient's report): No Awareness, Dental Status Current problems with teeth and/or dentures?: No Does patient usually wear dentures?: No  CIWA:  CIWA-Ar Total: 1 COWS:  COWS Total Score: 1  Musculoskeletal: Strength & Muscle Tone: within normal limits Gait & Station: normal Patient leans: N/A  Psychiatric Specialty Exam: Review of Systems  Psychiatric/Behavioral: Positive for depression. The patient is nervous/anxious.   All other systems reviewed and are negative. denies chest pain or shortness of breath at this time  Blood pressure 126/68, pulse 79, temperature 98.3 F (36.8 C), temperature source Oral, resp. rate 14, height 5' 8"  (1.727 m), weight 257 lb (116.574 kg).Body mass index is 39.09 kg/(m^2).  General Appearance: Fairly Groomed  Engineer, water::  Good   Speech:  Normal  Rate  Volume:  Normal  Mood:  Depressed  Affect:   Constricted but reactive   Thought Process:   Linear   Orientation:  Full (Time, Place, and Person)  Thought Content:  denies hallucinations, no delusions - does not appear internally preoccupied   Suicidal Thoughts:  No at this time denies any suicidal or self injurious ideations, and contracts for safety on the unit   Homicidal Thoughts:  No  Memory:  Recall 3/3 immediate and 3/3 at 5 minutes   Judgement:  Fair  Insight:  Fair  Psychomotor Activity:  Normal  Concentration:  Poor  Recall:  [employer] of Knowledge:Fair  Language: Fair  Akathisia:  No  Handed:  Right  AIMS (if indicated):     Assets:  Communication Skills Desire for Improvement  ADL's:  Intact  Cognition: WNL  Sleep:  Number of Hours: 6.75   Assessment - patient presenting with symptoms of depression, related to significant psychosocial stressors. Has reported episodes of increased confusion, and recently became lost when [place] from [place] of state . Wife has noted a degree of cognitive decline over a period of months . Today, however, patient is 0x3, and recall is 3/3 immediate and 3/3 at 5 minutes  - no current evidence of delirium . At this time behavior calm, in control, no psychomotor agitation or restlessness . Daily contact with patient to assess and evaluate symptoms and progress in treatment and Medication management   Will continue Wellbutrin 150 mg po bid for affective sx- pt has been on it since the past several years. Will continue Zoloft 25 mg po daily for affective sx. Will continue Trazodone 100 mg po qhs for sleep. Will continue Zyprexa 5 mg po q8h prn for severe agitation. Reduce Klonopin to 0.5 mg po bid prn for anxiety sx. Michaela Corner to continue inpatient psychiatric management as of tomorrow       Neita Garnet MD 11/04/2015, 5:13 PM

## 2015-11-04 NOTE — Plan of Care (Signed)
Problem: Diagnosis: Increased Risk For Suicide Attempt Goal: STG-Patient Will Attend All Groups On The Unit Outcome: Progressing Pt attended evening group on 11/03/15

## 2015-11-04 NOTE — Progress Notes (Signed)
Nutrition Education Note  RD consulted for nutrition education regarding hyperlipidemia.  Lipid Panel     Component Value Date/Time   CHOL 168 11/03/2015 0655   TRIG 143 11/03/2015 0655   HDL 33* 11/03/2015 0655   CHOLHDL 5.1 11/03/2015 0655   VLDL 29 11/03/2015 0655   LDLCALC 106* 11/03/2015 0655    RD provided "High Triglycerides Nutrition Therapy" handout from the Academy of Nutrition and Dietetics. Reviewed patient's dietary recall. Provided examples on ways to decrease sugar and fat intake in diet. Discouraged intake of processed foods and consumption of sugar-sweetened beverages. Encouraged fresh fruits and vegetables as well as whole grain sources of carbohydrates to maximize fiber intake. Teach back method used.  Expect good to fair compliance.  Body mass index is 39.09 kg/(m^2). Pt meets criteria for obesity based on current BMI.  Diet Order: Diet regular Room service appropriate?: Yes; Fluid consistency:: Thin Pt is also offered choice of unit snacks mid-morning and mid-afternoon.  Pt is eating as desired.   Labs and medications reviewed. No further nutrition interventions warranted at this time. If additional nutrition issues arise, please re-consult RD.     Jarome Matin, RD, LDN Inpatient Clinical Dietitian Pager # [phone] After hours/weekend pager # [phone]

## 2015-11-04 NOTE — Progress Notes (Signed)
DAR Note: Brad Miller has been in his room much of the day.  He comes out for groups.  Minimal interaction with peers.  He denies any SI/HI or A/V hallucinations.  He reported that he slept well last night.  He denies any physical complaints and appears to be in no physical distress.  He completed his self inventory and reports that his depression is 2/10, hopelessness 0/10 and anxiety 1/10.  He states that he is working towards achieving his goals so he can be released and he plans on meeting that goal.  Encouraged continued participation in group and unit activities.  Q 15 minute checks maintained for safety.  We will continue to monitor the progress towards his goals.

## 2015-11-04 NOTE — Progress Notes (Signed)
Brad Miller asked to speak with Probation officer.  He reports that he is very confused.  He doesn't understand what is going on here on the unit.  "I can't keep up.  I am having problems putting names with faces."  He is alert and oriented X 3.  Affect flat.  He states that he has a lot of stressors at home (financial-family).  He is worried about getting a new roommate since his roommate left.  He is worried that he won't be ready to go home and right now he doesn't feel like he is ready to leave.  Encouraged him to talk with the doctor about his concerns.  He was given a notebook that he can write down questions for the doctor and Probation officer assisted him with the names of the doctors on the unit so he can keep up with peoples names.  He denies any SI/HI or A/V hallucinations at this time.  We will continue to encourage participation in group and unit activities.  We will continue to monitor the progress towards his goals.

## 2015-11-04 NOTE — Tx Team (Signed)
Interdisciplinary Treatment Plan Update (Adult)  Date:  11/04/2015   Time Reviewed:  3:55 PM   Progress in Treatment: Attending groups: Yes. Participating in groups:  Yes. Taking medication as prescribed:  Yes. Tolerating medication:  Yes. Family/Significant other contact made:  Yes Patient understands diagnosis:  Yes  As evidenced by seeking help with depression, anxiety Discussing patient identified problems/goals with staff:  Yes, see initial care plan. Medical problems stabilized or resolved:  Yes. Denies suicidal/homicidal ideation: Yes. Issues/concerns per patient self-inventory:  No. Other:  New problem(s) identified:  Discharge Plan or Barriers:  See below  Reason for Continuation of Hospitalization: Anxiety Depression Medication stabilization  Comments: " I am still a bit anxious today. I did not have any more episodes of being confused.'  patient presenting with symptoms of depression, related to significant psychosocial stressors. Has reported episodes of increased confusion, and recently became lost when returning home from out of state . Wife has noted a degree of cognitive decline over a period of months . Today, however, patient is 0x3, and recall is 3/3 immediate and 3/3 at 5 minutes - no current evidence of delirium . At this time behavior calm, in control, no psychomotor agitation or restlessness . Daily contact with patient to assess and evaluate symptoms and progress in treatment and Medication management   Will continue Wellbutrin 150 mg po bid for affective sx- pt has been on it since the past several years. Will continue Zoloft 25 mg po daily for affective sx. Will continue Trazodone 100 mg po qhs for sleep. Will continue Zyprexa 5 mg po q8h prn for severe agitation. Reduce Klonopin to 0.5 mg po bid prn for anxiety sx.  Estimated length of stay:  Likely d/c tomorrow  New goal(s):  Review of initial/current patient goals per problem list:   Review of  initial/current patient goals per problem list:  1. Goal(s): Patient will participate in aftercare plan   Met: Yes   Target date: 3-5 days post admission date   As evidenced by: Patient will participate within aftercare plan AEB aftercare provider and housing plan at discharge being identified. Pt plans to return home, follow up outpt.  Goal met.  R Sabriyah Wilcher LCSW 11/05/2015 3:14 PM     2. Goal (s): Patient will exhibit decreased depressive symptoms and suicidal ideations.   Met: Yes   Target date: 3-5 days post admission date   As evidenced by: Patient will utilize self rating of depression at 3 or below and demonstrate decreased signs of depression or be deemed stable for discharge by MD. 11/04/15:  Although pt admits to a history of depression, he denies depression today.  Willing to try medication change.  3. Goal(s): Patient will demonstrate decreased signs and symptoms of anxiety.   Met: Yes   Target date: 3-5 days post admission date   As evidenced by: Patient will utilize self rating of anxiety at 3 or below and demonstrated decreased signs of anxiety, or be deemed stable for discharge by MD 11/04/15:  Rates his anxiety a 2 today.         Attendees: Patient:  11/04/2015 3:55 PM   Family:   11/04/2015 3:55 PM   Physician:  Ursula Alert, MD 11/04/2015 3:55 PM   Nursing:   Manuella Ghazi, RN 11/04/2015 3:55 PM   CSW:    Roque Lias, LCSW   11/04/2015 3:55 PM   Other:  11/04/2015 3:55 PM   Other:   11/04/2015 3:55 PM   Other:  Lars Pinks, Nurse CM 11/04/2015 3:55 PM   Other:   11/04/2015 3:55 PM   Other:  Norberto Sorenson, [place]  11/04/2015 3:55 PM   Other:  11/04/2015 3:55 PM   Other:  11/04/2015 3:55 PM   Other:  11/04/2015 3:55 PM   Other:  11/04/2015 3:55 PM   Other:  11/04/2015 3:55 PM   Other:   11/04/2015 3:55 PM    Scribe for Treatment Team:   Trish Mage, 11/04/2015 3:55 PM

## 2015-11-04 NOTE — Progress Notes (Signed)
D: Pt has anxious affect and mood.  Pt reports his day was "like all others."  He reports he attended music group today.  Pt reported that his wife and son visited and that it was a "good visit."  Pt denies SI/HI, denies hallucinations, reports back pain of 3/10.  Pt has been visible in milieu interacting with peers and staff appropriately.  Pt attended evening group.   A: Met with pt 1:1 and provided support and encouragement.  Medications administered per order.  PRN medication administered for anxiety.  He was provided with his CPAP machine after he verbally contracted for safety.   R: Pt is compliant with medications.  He reports that he will notify staff of needs and concerns.  Will continue to monitor and assess.

## 2015-11-04 NOTE — Progress Notes (Signed)
This morning pt reported to writer "I'm still kind of confused about this whole ordeal."  Pt states "I'm having trouble remembering people's names and what I'm about to do next."  Morning medications reviewed with pt.  Pt is oriented to self and date.  When pt was asked if he knew where he was, he stated "[facility]."  Pt was reminded that the hospital is now [facility] and he stated "yeah, the old [facility]."  Pt is currently in his room.  He denies needs and concerns at this time.

## 2015-11-04 NOTE — Progress Notes (Signed)
D:Patient in the hallway on approach.  Patient states he had a good day.  Patient states his goal for today was to go outside and interacting with peers.  Patient states he has been having periods where he does not know what to do.  Writer encouraged patient to ask questions.  Patient states he is frustrated because he can remember things that happen recently but cannot remember what happened before he came into the hospital.  Patient denies SI/HI and denies AVH. A: Staff to monitor Q 15 mins for safety.  Encouragement and support offered.  No scheduled medications administered per orders.  Klonopin administered prn for anxiety.  Trazodone administered prn for sleep. R: Patient remains safe on the unit.  Patient attended group tonight.  Patient visible on the unit.  Patient taking administered mediations

## 2015-11-05 NOTE — Progress Notes (Signed)
Patient ID: Brad Miller, male   DOB: 29-Sep-1951, 64 y.o.   MRN: [id number]  [facility] MD Progress Note  11/05/2015 3:18 PM Brad Miller  MRN:  [id number] Subjective: Patient states " I am confused about the system and the whole routine here.'    Objective:Brad Miller, 64 yo caucasian male who is married , employed , has a hx of depression and anxiety do was brought in to [facility] initially as a walk in on 10/31/2015 . Per initial notes in EHR " He also presented with the same symptoms and was a walk in on 10/30/2015. He was accompanied by his wife who was concerned greatly about his behavior. She states that in Nov 1, he lost his direction driving back home from [place]. 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 His wife described that he had sudden acute onset of loss of speech and a dazed appearance. He would rock back and forth and would have occasional shuffling gait. He was sent to ED for medical clearance. Patient was cleared by Neurology team and was referred to [facility] for further recommendations.'     Patient seen today and chart reviewed.Discussed patient with treatment team.  Patient today seen as calm , cooperative , alert , oriented x4.  Pt today able to do serial 3's ,his attention and concentration seems to be wnl and his memory seems to be intact. Pt continues to struggle with some forgetfulness , mostly about the events that led to his admission. Pt also is anxious about his discharge - states that he feels that patients who does not appear ready to be discharged are being send home , while he is not. Pt reports his anxiety as well as depression as improving. He is open to pursuing CBT on DC. Pt denies SI/HI/AH/VH.        Principal Problem: MDD (major depressive disorder), recurrent severe, without psychosis ([place])                                   R/O Functional neurological symptom disorder      Diagnosis:   Patient Active Problem List   Diagnosis Date Noted  . MDD  (major depressive disorder), recurrent severe, without psychosis ([place]) [F33.2] 11/02/2015  . Panic attacks [F41.0] 11/02/2015  . Acute confusional state [F05] 10/31/2015  . 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 Arthritis of right knee [M12.9] 02/23/2014   Total Time spent with patient:  25 minutes   Past Psychiatric History: Pt has a hx of depression as well as anxiety do . Pt has had one [facility] in the past [facility]. Pt denies any suicide attempts  Past Medical History:  Past Medical History  Diagnosis Date  . Hyperlipidemia     TAKES LOVASTATIN NIGHTLY  . Depression     TAKES WELLBUTRIN DAILY  . Anxiety     TAKES XANAX DAILY AS NEEDED  . Hypertension     TAKES CARDURA DAILY  . Sleep apnea     USES CPAP  . Headache(784.0)     OCCASIONALLY  . Arthritis   . Joint pain   . Urinary frequency   . Urinary urgency   . Enlarged prostate   . Diabetes mellitus without complication (HCC)     BORDERLINE  . Cataracts, bilateral   . Rash     AROUND NOSE AND STATES D/T NOT USING LOTION WHILE  SHAVING    Past Surgical History  Procedure Laterality Date  . 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 Total knee arthroplasty Right 02/23/2014    Procedure: TOTAL KNEE ARTHROPLASTY;  Surgeon: Kerin Salen, MD;  Location: [place];  Service: Orthopedics;  Laterality: Right;  . Knee arthroscopy Left 02/23/2014    Procedure: LEFT ARTHROSCOPY KNEE;  Surgeon: Kerin Salen, MD;  Location: [place];  Service: Orthopedics;  Laterality: Left;   Family History:  Family History  Problem Relation Age of Onset  . Hypertension Mother   . Heart attack Mother   . Osteoporosis Mother   . CVA Mother   . Coronary artery disease Mother   . Hypertension Father   . Emphysema Father   . Dementia Father   . Coronary artery disease Brother   . Diabetes Brother    Family Psychiatric  History:  Pt has a hx of dementia in his family - father , onset at >15 years  old. Father attempted suicide.  Social History: Pt is married , divorced once in the past. Pt is employed , but may have lost his job due to his recent issues. Pt has two children who are adults. Pt denies any legal issues. History        History  Alcohol Use  . 0.6 oz/week  . 1 Cans of beer per week    Comment: OCCASIONALLY     History  Drug Use No    Social History   Social History  . Marital Status: Married    Spouse Name: N/A  . Number of Children: N/A  . Years of Education: N/A   Social History Main Topics  . Smoking status: Never Smoker   . Smokeless tobacco: None  . Alcohol Use: 0.6 oz/week    1 Cans of beer per week     Comment: OCCASIONALLY  . Drug Use: No  . Sexual Activity: Not Currently   Other Topics Concern  . None   Social History Narrative   Additional Social History:    Pain Medications: denies Prescriptions: denies Over the Counter: denies History of alcohol / drug use?: No history of alcohol / drug abuse  Sleep:  improved  Appetite:  Fair  Current Medications: Current Facility-Administered Medications  Medication Dose Route Frequency Provider Last Rate Last Dose  . alum & mag hydroxide-simeth (MAALOX/MYLANTA) 200-200-20 MG/5ML suspension 30 mL  30 mL Oral Q4H PRN Kerrie Buffalo, NP      . buPROPion [facility] SR) 12 hr tablet 150 mg  150 mg Oral BID Kerrie Buffalo, NP   150 mg at 11/05/15 0815  . clonazePAM (KLONOPIN) tablet 0.5 mg  0.5 mg Oral BID PRN Ursula Alert, MD   0.5 mg at 11/04/15 2107  . doxazosin (CARDURA) tablet 4 mg  4 mg Oral Daily Carmin Muskrat, MD   4 mg at 11/05/15 0816  . ibuprofen (ADVIL,MOTRIN) tablet 600 mg  600 mg Oral Q8H PRN Kerrie Buffalo, NP   600 mg at 11/03/15 2154  . magnesium hydroxide (MILK OF MAGNESIA) suspension 30 mL  30 mL Oral Daily PRN Kerrie Buffalo, NP   30 mL at 11/01/15 2206  . OLANZapine (ZYPREXA) tablet 5 mg  5 mg Oral Q8H PRN Ursula Alert, MD       Or  . OLANZapine (ZYPREXA) injection 5  mg  5 mg Intramuscular Q8H PRN Roberto Hlavaty, MD      . sertraline (ZOLOFT) tablet 25 mg  25 mg Oral  Daily Ursula Alert, MD   25 mg at 11/05/15 0815  . traZODone (DESYREL) tablet 100 mg  100 mg Oral QHS PRN Jenne Campus, MD   100 mg at 11/04/15 2107    Lab Results:  No results found for this or any previous visit (from the past 12 hour(s)).  Physical Findings: AIMS: Facial and Oral Movements Muscles of Facial Expression: None, normal Lips and Perioral Area: None, normal Jaw: None, normal Tongue: None, normal,Extremity Movements Upper (arms, wrists, hands, fingers): None, normal Lower (legs, knees, ankles, toes): None, normal, Trunk Movements Neck, shoulders, hips: None, normal, Overall Severity Severity of abnormal movements (highest score from questions above): None, normal Incapacitation due to abnormal movements: None, normal Patient's awareness of abnormal movements (rate only patient's report): No Awareness, Dental Status Current problems with teeth and/or dentures?: No Does patient usually wear dentures?: No  CIWA:  CIWA-Ar Total: 1 COWS:  COWS Total Score: 1  Musculoskeletal: Strength & Muscle Tone: within normal limits Gait & Station: normal Patient leans: N/A  Psychiatric Specialty Exam: Review of Systems  Psychiatric/Behavioral: The patient is nervous/anxious.   All other systems reviewed and are negative. denies chest pain or shortness of breath at this time  Blood pressure 119/71, pulse 90, temperature 98.2 F (36.8 C), temperature source Oral, resp. rate 14, height 5\' 8"  (1.727 m), weight 116.574 kg (257 lb).Body mass index is 39.09 kg/(m^2).  General Appearance: Fairly Groomed  Engineer, water::  Good   Speech:  Normal Rate  Volume:  Normal  Mood:  Anxious and Depressed IMPROVING  Affect:   Constricted but reactive   Thought Process:   Linear   Orientation:  Full (Time, Place, and Person)  Thought Content:  denies hallucinations, no delusions - does not  appear internally preoccupied   Suicidal Thoughts:  No at this time denies any suicidal or self injurious ideations, and contracts for safety on the unit   Homicidal Thoughts:  No  Memory:  Immediate - fair, recent - fair , remote - fair  Judgement:  Fair  Insight:  Fair  Psychomotor Activity:  Normal  Concentration:  Poor  Recall:  [employer] of Knowledge:Fair  Language: Fair  Akathisia:  No  Handed:  Right  AIMS (if indicated):     Assets:  Communication Skills Desire for Improvement  ADL's:  Intact  Cognition: WNL  Sleep:  Number of Hours: 6.25   Assessment - patient presenting with symptoms of depression, related to significant psychosocial stressors. Has reported episodes of increased confusion, and recently became lost when [place] . Wife has noted a degree of cognitive decline over a period of months . Today, however, patient is 0x3, and recall is good , and his attention, concentration seems to be improved.  Daily contact with patient to assess and evaluate symptoms and progress in treatment and Medication management   Will continue Wellbutrin 150 mg po bid for affective sx- pt has been on it since the past several years. Will continue Zoloft 25 mg po daily for affective sx. Will continue Trazodone 100 mg po qhs for sleep. Will continue Zyprexa 5 mg po q8h prn for severe agitation. Reduced Klonopin to 0.5 mg po bid prn for anxiety sx. Refer to CBT on discharge - since pt has been dealing with several psychosocial stressors. CSW will work on disposition.      Enda Santo MD 11/05/2015, 3:18 PM

## 2015-11-05 NOTE — Plan of Care (Signed)
Problem: Diagnosis: Increased Risk For Suicide Attempt Goal: STG-Patient Will Report Suicidal Feelings to Staff Outcome: Progressing Patient denies SI.  Patient verbally contracts for safety.     

## 2015-11-05 NOTE — BHH Group Notes (Signed)
[facility] Group Notes:  (Counselor/Nursing/MHT/Case Management/Adjunct)  11/05/2015 1:15PM  Type of Therapy:  Group Therapy  Participation Level:  Active  Participation Quality:  Appropriate  Affect:  Flat  Cognitive:  Oriented  Insight:  Improving  Engagement in Group:  Limited  Engagement in Therapy:  Limited  Modes of Intervention:  Discussion, Exploration and Socialization  Summary of Progress/Problems: The topic for group was balance in life.  Pt participated in the discussion about when their life was in balance and out of balance and how this feels.  Pt discussed ways to get back in balance and short term goals they can work on to get where they want to be. Stayed the entire time.  Engaged throughout.  Reluctant to speak at first, but willing to continue with encouragement.  Talked about his determination that has got him through to this point, and how he inherited that from his mother who lived on her own until she was 13.  He strives to learn from experiences and better himself on a daily basis.  "I'm trying to learn all I can from this experience so that I don't need to walk this road again."   Brad Miller 11/05/2015 3:25 PM

## 2015-11-05 NOTE — BHH Group Notes (Signed)
[facility] Group Notes:  (Nursing/MHT/Case Management/Adjunct)  Date:  11/05/2015  Time:0930 Type of Therapy:  Nurse Education  Participation Level:  Active  Participation Quality:  Appropriate and Attentive  Affect:  Appropriate  Cognitive:  Alert and Appropriate  Insight:  Appropriate, Good and Improving  Engagement in Group:  Engaged and Improving  Modes of Intervention:  Activity, Discussion, Education and Exploration  Summary of Progress/Problems: Topic was recovery. Discussed what recovery means to the individual and ways to ensure that the individual stays on the road to recovery. Group educated on the importance of medication adherence. Patient was attentive and receptive. states goal is to take advantage of the skills he has learned from being here and to change his lifestyle .Mart Piggs 11/05/2015, 6:16 PM

## 2015-11-05 NOTE — Progress Notes (Signed)
DAR NOTE: Patient presents with flat affect and depressed mood.  Denies pain, auditory and visual hallucinations.  Rates depression at 2, hopelessness at 2, and anxiety at 2.  Maintained on routine safety checks.  Medications given as prescribed.  Support and encouragement offered as needed.  Attended group and participated.  States goal for today is "work on how to concentrate more."  Patient up most of the shift sitting in the dayroom watching TV.

## 2015-11-06 MED ORDER — SERTRALINE HCL 25 MG PO TABS: 25.0000 mg | ORAL_TABLET | Freq: Every day | ORAL | Status: DC

## 2015-11-06 MED ORDER — DOXAZOSIN MESYLATE 8 MG PO TABS: 4.0000 mg | ORAL_TABLET | Freq: Every day | ORAL | Status: AC

## 2015-11-06 MED ORDER — BUPROPION HCL ER (SR) 150 MG PO TB12: 150.0000 mg | ORAL_TABLET | Freq: Two times a day (BID) | ORAL | Status: DC

## 2015-11-06 MED ORDER — TRAZODONE HCL 100 MG PO TABS: 100.0000 mg | ORAL_TABLET | Freq: Every evening | ORAL | Status: DC | PRN

## 2015-11-06 MED ORDER — TESTOSTERONE CYPIONATE 200 MG/ML IM SOLN: 300.0000 mg | INTRAMUSCULAR | Status: DC

## 2015-11-06 MED ORDER — CLONAZEPAM 0.5 MG PO TABS: 0.5000 mg | ORAL_TABLET | Freq: Two times a day (BID) | ORAL | Status: DC | PRN

## 2015-11-06 NOTE — Progress Notes (Signed)
Discharge note: Pt discharged per MD order. Discharge summary reviewed with pt. Pt verbalizes and signs understanding of discharge. Pt denies SI/HI. Denies AVH. CPAP machine returned to pt from medication room. No personal items locked in locker. Pt signs that he had no personal belongings in locker. Pt ambulatory out of facility. Pt wife in lobby for discharge.

## 2015-11-06 NOTE — BHH Suicide Risk Assessment (Signed)
[facility] INPATIENT:  Family/Significant Other Suicide Prevention Education  Suicide Prevention Education:  Education Completed; No one has been identified by the patient as the family member/significant other with whom the patient will be residing, and identified as the person(s) who will aid the patient in the event of a mental health crisis (suicidal ideations/suicide attempt).  With written consent from the patient, the family member/significant other has been provided the following suicide prevention education, prior to the and/or following the discharge of the patient.  The suicide prevention education provided includes the following:  Suicide risk factors  Suicide prevention and interventions  National Suicide Hotline telephone number  [facility] assessment telephone number  [facility] Emergency Assistance [place] and/or Residential Mobile Crisis Unit telephone number  Request made of family/significant other to:  Remove weapons (e.g., guns, rifles, knives), all items previously/currently identified as safety concern.    Remove drugs/medications (over-the-counter, prescriptions, illicit drugs), all items previously/currently identified as a safety concern.  The family member/significant other verbalizes understanding of the suicide prevention education information provided.  The family member/significant other agrees to remove the items of safety concern listed above. The patient did not endorse SI at the time of admission, nor did the patient c/o SI during the stay here.  SPE not required.   Roque Lias B 11/06/2015, 8:39 AM

## 2015-11-06 NOTE — Progress Notes (Signed)
  [facility] Adult Case Management Discharge Plan :  Will you be returning to the same living situation after discharge:  Yes,  home At discharge, do you have transportation home?: Yes,  wife Do you have the ability to pay for your medications: Yes,  insurance  Release of information consent forms completed and in the chart;  Patient's signature needed at discharge.  Patient to Follow up at: Follow-up Information    Follow up with [facility] On 11/08/2015.   Why:  Friday at [place] for an 8:30 appointment with Otelia Sergeant, therapist.  Bring along your completed paperwork package   Contact information:   [address]  [facility]  [phone]      Follow up with [facility] On 11/12/2015.   Why:  Tuesday at 8:30 with Dr Emeline Darling information:   [address]      Next level of care provider has access to [place]:  unknown  Patient denies SI/HI: Yes,  yes    Safety Planning and Suicide Prevention discussed: Yes,  yes  Have you used any form of tobacco in the last 30 days? (Cigarettes, Smokeless Tobacco, Cigars, and/or Pipes): No  Has patient been referred to the Quitline?: N/A patient is not a smoker  [place] B 11/06/2015, 8:38 AM

## 2015-11-06 NOTE — Discharge Summary (Signed)
Physician Discharge Summary Note  Patient:  Brad Miller is an 64 y.o., male MRN:  [id number] DOB:  22-May-1951 Patient phone:  [phone] (home)  Patient address:   [address] [place] [id number],  Total Time spent with patient: 45 minutes  Date of Admission:  11/01/2015 Date of Discharge: 11/06/2015  Reason for Admission:   History of Present Illness: "Brad Miller, 64 yo caucasian male who is married , employed , has a hx of depression and anxiety do was brought in to [facility] initially as a walk in on 10/31/2015 . Per initial notes in EHR" He also presented with the same symptoms and was a walk in on 10/30/2015. He was accompanied by his wife who was concerned greatly about his behavior. She states that in Nov 1, he lost his direction driving back home from [place]. 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 His wife described that he had sudden acute onset of loss of speech and a dazed appearance. He would rock back and forth and would have occasional shuffling gait. He was sent to ED for medical clearance.Brad Miller, his wife brought him back to [facility].Patient had similar presentations and was send to [facility] for medical/neurological clearance. Pt had MRI brain done - which showed no acute abnormality , but the study was severely degraded by patient motion."  Patient seen and chart reviewed.Discussed patient with treatment team. Pt today seen as calm , cooperative and was able to give a lot of information about his current admission. Pt reports several psychosocial stressors like financial issues as well as a recent extramarital relationship which he had and he discussed this with his wife recently. She did not take it well. He also was cheating his nephew in [place] , for whom he was working for , cheated on him monetarily . Pt currently has a lot of guilt and anxiety regarding his situation. Pt reports that he recently lost his way and was confused while he was driving from [place].  This has never happened before. Pt reports he felt like he had memory issues and got confused about the election date , but was able to recollect . Pt reports he has never felt this way before. 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 Pt reports some sleep issues on and off , due to anxiety issues. He has been having a feeling of worthlessness, guilt and anhedonia as well as sadness. Pt reports he was agitated in the ED and he does not know why it happened.   Pt denies any hx of panic attacks in the past , but he does have anxiety issues and he has been on Paxil and wellbutrin since the past several years . Pt reports Paxil was added for anxiety sx. Pt denies hx of bipolar sx/PTSD sx.Pt denies any SI/HI/AH/VH. Pt denies any hx of substance abuse. Pt reports 1 prior [facility] , [facility] - for [facility]. At that time he was going through his first divorce. Pt other than his most recent episode of confusion when he lost his way , denies any problems managing his bills , remembering faces or names or getting around in familiar places , or getting lost . Pt's father had some dementia like sx , onset more than 66 years old . He does not know for sure what kind of dementia that was.  Principal Problem: MDD (major depressive disorder), recurrent severe, without psychosis ([place]) Discharge  Diagnoses: Patient Active Problem List   Diagnosis Date Noted  . MDD (major depressive disorder), recurrent severe, without psychosis ([place]) [F33.2] 11/02/2015  . Panic attacks [F41.0] 11/02/2015  . Acute confusional state [F05] 10/31/2015  . 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 Arthritis of right knee [M12.9] 02/23/2014    Past Psychiatric History: Pt has a hx of depression as well as anxiety do . [facility]. Pt denies any suicide attempts.    Past Medical History:  Past Medical History  Diagnosis Date  . Hyperlipidemia     TAKES LOVASTATIN NIGHTLY  . Depression     TAKES WELLBUTRIN DAILY  . Anxiety     TAKES XANAX DAILY AS NEEDED  . Hypertension     TAKES CARDURA DAILY  . Sleep apnea     USES CPAP  . Headache(784.0)     OCCASIONALLY  . Arthritis   . Joint pain   . Urinary frequency   . Urinary urgency   . Enlarged prostate   . Diabetes mellitus without complication (HCC)     BORDERLINE  . Cataracts, bilateral   . Rash     AROUND NOSE AND STATES D/T NOT USING LOTION WHILE SHAVING    Past Surgical History  Procedure Laterality Date  . 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 Total knee arthroplasty Right 02/23/2014    Procedure: TOTAL KNEE ARTHROPLASTY;  Surgeon: Kerin Salen, MD;  Location: [place];  Service: Orthopedics;  Laterality: Right;  . Knee arthroscopy Left 02/23/2014    Procedure: LEFT ARTHROSCOPY KNEE;  Surgeon: Kerin Salen, MD;  Location: [place];  Service: Orthopedics;  Laterality: Left;   Family History:  Family History  Problem Relation Age of Onset  . Hypertension Mother   . Heart attack Mother   . Osteoporosis Mother   . CVA Mother   . Coronary artery disease Mother   . Hypertension Father   . Emphysema Father   . Dementia Father   . Coronary artery disease Brother   . Diabetes Brother    Family Psychiatric  History:  Pt has a hx of dementia in his family - father , onset at >59 years old. Father attempted suicide.  Social History:  History  Alcohol Use  . 0.6 oz/week  . 1 Cans of beer per week    Comment: OCCASIONALLY     History  Drug Use No    Social History   Social History  . Marital Status: Married    Spouse Name: N/A  . Number of Children: N/A  . Years of Education: N/A   Social History Main Topics  . Smoking status: Never Smoker   . Smokeless tobacco: None  . Alcohol Use: 0.6 oz/week    1 Cans of beer per week     Comment: OCCASIONALLY  . Drug Use: No  . Sexual  Activity: Not Currently   Other Topics Concern  . None   Social History Narrative    Hospital Course:   Brad Miller was admitted for MDD (major depressive disorder), recurrent severe, without psychosis ([place]), and crisis management.  Pt was treated discharged with the medications listed below under Medication List.  Medical problems were identified and treated as needed.  Home medications were restarted as appropriate.  Improvement was monitored by observation and Brad Miller 's daily report of symptom reduction.  Emotional and mental status was  monitored by daily self-inventory reports completed by Brad Miller and clinical staff.         Brad Miller was evaluated by the treatment team for stability and plans for continued recovery upon discharge. Brad Miller 's motivation was an integral factor for scheduling further treatment. Employment, transportation, bed availability, health status, family support, and any pending legal issues were also considered during hospital stay. Pt was offered further treatment options upon discharge including but not limited to Residential, Intensive Outpatient, and Outpatient treatment.  Brad Miller will follow up with the services as listed below under Follow Up Information.     Upon completion of this admission the patient was both mentally and medically stable for discharge denying suicidal/homicidal ideation, auditory/visual/tactile hallucinations, delusional thoughts and paranoia.    Pt initially presented with acute confusion and psychotic symptoms, acting very strange at home according to his wife, including expressing emotions inappropriately. Pt was treated with Klonopin, Zyprexa, Trazodone, and Wellbutrin, to which he responded well without adverse effects. Pt will follow-up as listed below at outpatient. His A1C was 5.9 and he should see outpatient family medical providers for a recheck in 90 days. All other labs including CBC, CMP, UDS, and BAL, were  reviewed and unremarkable.    Physical Findings: AIMS: Facial and Oral Movements Muscles of Facial Expression: None, normal Lips and Perioral Area: None, normal Jaw: None, normal Tongue: None, normal,Extremity Movements Upper (arms, wrists, hands, fingers): None, normal Lower (legs, knees, ankles, toes): None, normal, Trunk Movements Neck, shoulders, hips: None, normal, Overall Severity Severity of abnormal movements (highest score from questions above): None, normal Incapacitation due to abnormal movements: None, normal Patient's awareness of abnormal movements (rate only patient's report): No Awareness, Dental Status Current problems with teeth and/or dentures?: No Does patient usually wear dentures?: No  CIWA:  CIWA-Ar Total: 1 COWS:  COWS Total Score: 1  Musculoskeletal: Strength & Muscle Tone: within normal limits Gait & Station: normal Patient leans: N/A  Psychiatric Specialty Exam: Review of Systems  Psychiatric/Behavioral: Positive for depression. Negative for suicidal ideas and hallucinations. The patient is nervous/anxious and has insomnia.   All other systems reviewed and are negative.   Blood pressure 128/68, pulse 90, temperature 98.2 F (36.8 C), temperature source Oral, resp. rate 14, height 5\' 8"  (1.727 m), weight 116.574 kg (257 lb).Body mass index is 39.09 kg/(m^2).  SEE MD PSE within the [place]   Have you used any form of tobacco in the last 30 days? (Cigarettes, Smokeless Tobacco, Cigars, and/or Pipes): No  Has this patient used any form of tobacco in the last 30 days? (Cigarettes, Smokeless Tobacco, Cigars, and/or Pipes) Yes, No  Metabolic Disorder Labs:  Lab Results  Component Value Date   HGBA1C 5.9* 11/03/2015   MPG 123 11/03/2015   No results found for: PROLACTIN Lab Results  Component Value Date   CHOL 168 11/03/2015   TRIG 143 11/03/2015   HDL 33* 11/03/2015   CHOLHDL 5.1 11/03/2015   VLDL 29 11/03/2015   LDLCALC 106* 11/03/2015    See  Psychiatric Specialty Exam and Suicide Risk Assessment completed by Attending Physician prior to discharge.  Discharge destination:  Home  Is patient on multiple antipsychotic therapies at discharge:  No   Has Patient had three or more failed trials of antipsychotic monotherapy by history:  No  Recommended Plan for Multiple Antipsychotic Therapies: NA     Medication List    STOP taking these medications        acetaminophen 500  MG tablet  Commonly known as:  TYLENOL     aspirin EC 325 MG tablet     FIORICET PO     FISH OIL PO     ibuprofen 800 MG tablet  Commonly known as:  ADVIL,MOTRIN     methocarbamol 500 MG tablet  Commonly known as:  ROBAXIN     oxyCODONE-acetaminophen 5-325 MG tablet  Commonly known as:  ROXICET     PARoxetine 10 MG tablet  Commonly known as:  PAXIL     PARoxetine 40 MG tablet  Commonly known as:  PAXIL     POTASSIUM PO     pyridOXINE 50 MG tablet  Commonly known as:  VITAMIN B-6     TURMERIC PO     vitamin B-12 1000 MCG tablet  Commonly known as:  CYANOCOBALAMIN      TAKE these medications      Indication   buPROPion 150 MG 12 hr tablet  Commonly known as:  WELLBUTRIN SR  Take 1 tablet (150 mg total) by mouth 2 (two) times daily.   Indication:  Major Depressive Disorder     clonazePAM 0.5 MG tablet  Commonly known as:  KLONOPIN  Take 1 tablet (0.5 mg total) by mouth 2 (two) times daily as needed (anxiety).   Indication:  anxiety     doxazosin 8 MG tablet  Commonly known as:  CARDURA  Take 0.5 tablets (4 mg total) by mouth daily.   Indication:  High Blood Pressure     sertraline 25 MG tablet  Commonly known as:  ZOLOFT  Take 1 tablet (25 mg total) by mouth daily.   Indication:  Major Depressive Disorder     sildenafil 50 MG tablet  Commonly known as:  VIAGRA  Take 25-50 mg by mouth daily as needed for erectile dysfunction.      testosterone cypionate 200 MG/ML injection  Commonly known as:  DEPOTESTOSTERONE CYPIONATE   Inject 1.5 mLs (300 mg total) into the muscle every 28 (twenty-eight) days. To be managed outpatient   Indication:  Deficient Activity of Testis or Ovary     traZODone 100 MG tablet  Commonly known as:  DESYREL  Take 1 tablet (100 mg total) by mouth at bedtime as needed for sleep.   Indication:  Trouble Sleeping           Follow-up Information    Follow up with [facility] On 11/08/2015.   Why:  Friday at [place] for an 8:30 appointment with Otelia Sergeant, therapist.  Bring along your completed paperwork package   Contact information:   [address]  [facility]  [phone]      Follow up with [facility] On 11/12/2015.   Why:  Tuesday at 8:30 with Dr Emeline Darling information:   [address]      Follow-up recommendations:  Activity:  As tolerated Diet:  Heart healthy with low sodium.  Comments:   Take all medications as prescribed. Keep all follow-up appointments as scheduled.  Do not consume alcohol or use illegal drugs while on prescription medications. Report any adverse effects from your medications to your primary care provider promptly.  In the event of recurrent symptoms or worsening symptoms, call 911, a crisis hotline, or go to the nearest emergency department for evaluation.   Signed: Benjamine Mola, FNP-BC 11/06/2015, 11:31 AM

## 2015-11-06 NOTE — Progress Notes (Signed)
   D: When asked about his day, the pt sat quietly. When asked if he had any questions or concerns, the pt sat quietly. When asked if he had a chance to speak to his Dr, pt immediately stated, "yes". When asked if he had any questions about he conversation, pt stated, "I can't remember what we talked about".  Pt has no questions or concerns.   A:  Support and encouragement was offered. 15 min checks continued for safety.  R: Pt remains safe.

## 2015-11-06 NOTE — Progress Notes (Signed)
Pt wife; Jacquelynn Cree called this nurse requesting to speak with the MD in regards to pt's discharge plan. MD notified and given wife contact information.

## 2015-11-06 NOTE — BHH Suicide Risk Assessment (Signed)
[facility] Discharge Suicide Risk Assessment   Demographic Factors:  Male and Caucasian  Total Time spent with patient: 30 minutes  Musculoskeletal: Strength & Muscle Tone: within normal limits Gait & Station: normal Patient leans: N/A  Psychiatric Specialty Exam: Physical Exam  Review of Systems  Psychiatric/Behavioral: Positive for depression (IMPROVED). The patient is nervous/anxious (IMPROVED).   All other systems reviewed and are negative.   Blood pressure 128/68, pulse 90, temperature 98.2 F (36.8 C), temperature source Oral, resp. rate 14, height 5\' 8"  (1.727 m), weight 116.574 kg (257 lb).Body mass index is 39.09 kg/(m^2).  General Appearance: Casual  Eye Contact::  Fair  Speech:  Clear and [id number]  Volume:  Normal  Mood:  Anxious and Depressed IMPROVED   Affect:  Congruent  Thought Process:  Coherent  Orientation:  Full (Time, Place, and Person)  Thought Content:  WDL  Suicidal Thoughts:  No  Homicidal Thoughts:  No  Memory:  Immediate;   Fair Recent;   Fair Remote;   Fair Patient is alert, ox3 , is able to remember 2/3 words as well as do serial 3's , able to spell the word "WORLD' forward and backward.  Judgement:  Fair  Insight:  Fair  Psychomotor Activity:  Normal  Concentration:  Fair  Recall:  [employer] of Knowledge:Fair  Language: Fair  Akathisia:  No  Handed:  Right  AIMS (if indicated):     Assets:  Desire for Improvement  Sleep:  Number of Hours: 6.5  Cognition: WNL  ADL's:  Intact   Have you used any form of tobacco in the last 30 days? (Cigarettes, Smokeless Tobacco, Cigars, and/or Pipes): No  Has this patient used any form of tobacco in the last 30 days? (Cigarettes, Smokeless Tobacco, Cigars, and/or Pipes) No  Mental Status Per Nursing Assessment::   On Admission:  NA  Current Mental Status by Physician: Pt denies SI/HI/AH/VH  Loss Factors: Decrease in vocational status  Historical Factors: Impulsivity  Risk Reduction Factors:    Positive social support  Continued Clinical Symptoms:  Previous Psychiatric Diagnoses and Treatments  Cognitive Features That Contribute To Risk:  None    Suicide Risk:  Minimal: No identifiable suicidal ideation.  Patients presenting with no risk factors but with morbid ruminations; may be classified as minimal risk based on the severity of the depressive symptoms  Principal Problem: MDD (major depressive disorder), recurrent severe, without psychosis [facility]) Discharge Diagnoses:  Patient Active Problem List   Diagnosis Date Noted  . MDD (major depressive disorder), recurrent severe, without psychosis ([place]) [F33.2] 11/02/2015  . Panic attacks [F41.0] 11/02/2015  . Acute confusional state [F05] 10/31/2015  . 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 Arthritis of right knee [M12.9] 02/23/2014    Follow-up Information    Follow up with [facility] On 11/08/2015.   Why:  Friday at [place] for an 8:30 appointment with Otelia Sergeant, therapist.  Bring along your completed paperwork package   Contact information:   [address]  [facility]  [phone]      Follow up with [facility] On 11/12/2015.   Why:  Tuesday at 8:30 with Dr Emeline Darling information:   [address]      Plan Of Care/Follow-up recommendations:  Activity:  No restrictions Diet:  regular Tests:  as needed Other:  follow up with after care as needed  Is patient on multiple antipsychotic therapies  at discharge:  No   Has Patient had three or more failed trials of antipsychotic monotherapy by history:  No  Recommended Plan for Multiple Antipsychotic Therapies: NA    Joal Eakle MD 11/06/2015, 9:38 AM

## 2015-11-06 NOTE — Progress Notes (Signed)
D:Per patient self inventory form pt reports he slept fair last night. He reports a good appetite. Low energy level, poor concentration. He rates depression 5/10, anxiety 5/10- both on 0-10 scale, 10 being the worse. Pt denies SI/HI. Denies AVH. Pt denies physical pain. Pt reports his goal is "try to concentrate more" Pt reports he will "try harder" to help meet his goal today. "Keep having problems catching on."  Pt reports he "Keep going in circles, do not feel I am meeting my goals" and that it will keep him from following his discharge plan. Pt voices to this nurse that he does not feel that he is progressing in groups as he should.  A:Special checks q 15 mins in place for safety. Medication administered per MD order(see eMAR). Discharge planning in place. MD notified of pt's concern in treatment team. Encouragement and support provided.  R:Safety maintained. Compliant with medication regimen. Will continue to monitor

## 2015-11-08 ENCOUNTER — Encounter (HOSPITAL_COMMUNITY): Payer: Self-pay | Admitting: Licensed Clinical Social Worker

## 2015-11-08 ENCOUNTER — Ambulatory Visit (INDEPENDENT_AMBULATORY_CARE_PROVIDER_SITE_OTHER): Payer: Medicare HMO | Admitting: Licensed Clinical Social Worker

## 2015-11-08 DIAGNOSIS — F332 Major depressive disorder, recurrent severe without psychotic features: Secondary | ICD-10-CM

## 2015-11-08 NOTE — Progress Notes (Deleted)
Comprehensive Clinical Assessment (CCA) Note  11/08/2015 Brad Miller [id number]  CCA Part One  Part One has been completed on paper by the patient.  (See scanned document in Chart Review)  CCA Part Two A  Intake/Chief Complaint:   Patient presents today for depression and confusion. He was just discharged from [facility] inpatient. His depression started in August of 2016. His mother had to be placed in a nursing home and she is in [place]. His two older brothers had to take care of it. 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 He has felt apprehensive since discharge from hospital and still a little confused.    Mental Health Symptoms Depression:   lower energy, difficulty concentrating, hopeless, losing some weight,   Mania:   n/a  Anxiety:    difficult concentrating, fatigue, worrying-daily  Psychosis: n/a    Trauma:  avoids reminders of the event, , guilt/shame, identifies past trauma   Obsessions:   n/a  Compulsions:   n/a  Inattention: n/a    Hyperactivity/Impulsivity:  n/a   Oppositional/Defiant Behaviors:   n/a  Borderline Personality:    n/a  Other Mood/Personality Symptoms:   n/a   Mental Status Exam Appearance and self-care  Stature:     Weight:     Clothing:     Grooming:     Cosmetic use:     Posture/gait:     Motor activity:     Sensorium  Attention:     Concentration:     Orientation:     Recall/memory:     Affect and Mood  Affect:     Mood:     Relating  Eye contact:     Facial expression:     Attitude toward examiner:     Thought and Language  Speech flow:    Thought content:     Preoccupation:     Hallucinations:     Organization:     Transport planner of Knowledge:     Intelligence:     Abstraction:     Judgement:     Reality Testing:     Insight:     Decision Making:     Social Functioning  Social Maturity:      Social Judgement:     Stress  Stressors:     Coping Ability:     Skill Deficits:     Supports:      Family and Psychosocial History:  Patient is married, not sexually active, heterosexual, he has two children. His son is the oldest and thinks he is 41 and daughter is 11. He has a good relationship with them,   Childhood History:   He was raised by both parents in [place], he had a good relationship with parents and good childhood. His father has passed and upset about his mom who just went into a nursing home. He has three brothers, two in [place] and the other one is in [place]. He was disciplined with a belt or a switch. He denies history of abuse. 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 It does not meet his financial needs. His job has been impacted as he had to quit due to problems in concentration. He was changing double garage into a  finished living space. 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 He has not been in the [employer], denies legal issues, there are guns in his home and safely secured in the gun safe Leisure/Recreation:  He used to like to read a lot but since he lost his concentration reading has not been enjoyable to him.   Exercise/Diet:  He plans to start walking. He has lost 20 lbs, he does not follow a special diet, no problem sleeping  Alcohol/Drug Use:  Occasional alcohol use. He would drink a beer when he went out to dinner with his wife about twice a month. He said due to financial problems this won't happen as much. Re                      CCA Part Three  ASAM's:  Six Dimensions of Multidimensional Assessment  Dimension 1:  Acute Intoxication and/or Withdrawal Potential:     Dimension 2:  Biomedical Conditions and Complications:     Dimension 3:   Emotional, Behavioral, or Cognitive Conditions and Complications:     Dimension 4:  Readiness to Change:     Dimension 5:  Relapse, Continued use, or Continued Problem Potential:     Dimension 6:  Recovery/Living Environment:      Substance use Disorder (SUD)    Social Function:   Responsible-social maturity, social judgement-normal  Stress:   Work, Museum/gallery curator, concerned about his mom who is in a rest home. He knows she is getting adequate care. Coping ability-overwhelmed, taking the meds the way the doctor prescribed.   Risk Assessment- Self-Harm Potential: No current thoughts to harm self, he has fleeting thoughts in the past, not the recent past and has not gotten worse over the last year. No plan to harm self and no past attempts to harm self    Risk Assessment -Dangerous to Others Potential: n/a    DSM5 Diagnoses: Patient Active Problem List   Diagnosis Date Noted  . MDD (major depressive disorder), recurrent severe, without psychosis ([place]) 11/02/2015  . Panic attacks 11/02/2015  . Acute confusional state 10/31/2015  . Arthritis of knee, right 02/23/2014    Class: Chronic  . Arthritis of right knee 02/23/2014    Patient Centered Plan: Patient is on the following Treatment Plan(s):  {CHL AMB BH OP Treatment Plans:[id number]}  Recommendations for Services/Supports/Treatments:   Recommendation for outpatient and psychiatric treatment  Treatment Plan Summary:    Referrals to Alternative Service(s): Referred to Alternative Service(s):   Place:   Date:   Time:    Referred to Alternative Service(s):   Place:   Date:   Time:    Referred to Alternative Service(s):   Place:   Date:   Time:    Referred to Alternative Service(s):   Place:   Date:   Time:     Bowman,Mary A

## 2015-11-08 NOTE — Progress Notes (Addendum)
Comprehensive Clinical Assessment (CCA) Note  11/08/2015 Brad Miller [id number]  CCA Part One  Part One has been completed on paper by the patient.  (See scanned document in Chart Review)  CCA Part Two A  Intake/Chief Complaint:  CCA Intake With Chief Complaint CCA Part Two Date: 11/08/15 CCA Part Two Time: 0900 Chief Complaint/Presenting Problem:   Patient presents today for depression and confusion. He was just discharged from [facility] inpatient. His depression started in August of 2016. His mother had to be placed in a nursing home and she is in [place]. His two older brothers had to take care of it. 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 He has felt apprehensive since discharge from hospital and still a little confused.   Patients Currently Reported Symptoms/Problems: Depression and Confusion Collateral Involvement: His wife, Brad Miller Strengths: Perserverance  Individual's Preferences: Individual therapy 1-2x a week and psychiatric services Type of Services Patient Feels Are Needed: Therapy, psychiatric services Initial Clinical Notes/Concerns: Patient presents with confusion and may need referral to neurologist for confusion and psychiatric evaluation for further clarificaiton. He also is struggling with financial due to inability to work because of confusion.   Mental Health Symptoms Depression:  Depression: Change in energy/activity, Difficulty Concentrating, Hopelessness, Weight gain/loss  Mania:  Mania: N/A  Anxiety:   Anxiety: Difficulty concentrating, Fatigue, Worrying (daily worrying)  Psychosis:  Psychosis: N/A  Trauma:  Trauma: Avoids reminders of event, Guilt/shame (identified past trauma related to guilt and shame toward nephew but seem unclear as to meaning of trauma )  Obsessions:  Obsessions: N/A  Compulsions:  Compulsions: N/A  Inattention:   Inattention: N/A  Hyperactivity/Impulsivity:  Hyperactivity/Impulsivity: N/A  Oppositional/Defiant Behaviors:  Oppositional/Defiant Behaviors: N/A  Borderline Personality:  Emotional Irregularity: N/A  Other Mood/Personality Symptoms:      Mental Status Exam Appearance and self-care  Stature:  Stature: Average  Weight:  Weight: Average weight  Clothing:  Clothing: Casual  Grooming:  Grooming: Normal  Cosmetic use:  Cosmetic Use: None  Posture/gait:  Posture/Gait: Normal  Motor activity:  Motor Activity: Slowed  Sensorium  Attention:  Attention: Confused  Concentration:  Concentration:  (some problems with concentration)  Orientation:  Orientation: Object, Person, Place, Situation, Time  Recall/memory:  Recall/Memory: Defective in short-term, Defective in Remote  Affect and Mood  Affect:  Affect: Not Congruent  Mood:  Mood: Euthymic  Relating  Eye contact:  Eye Contact: Normal  Facial expression:  Facial Expression: Responsive  Attitude toward examiner:  Attitude Toward Examiner: Cooperative  Thought and Language  Speech flow: Speech Flow: Blocked  Thought content:  Thought Content: Appropriate to mood and circumstances  Preoccupation:  Preoccupations: Guilt, Other (Comment) (finacial concerns)  Hallucinations:  Hallucinations:  (none)  Organization:     Transport planner of Knowledge:  Fund of Knowledge: Impoverished by:  (Comment) (confusion)  Intelligence:  Intelligence: Needs investigation  Abstraction:  Abstraction: Concrete  Judgement:  Judgement: Poor  Reality Testing:  Reality Testing: Realistic  Insight:  Insight: Poor  Decision Making:  Decision Making: Vacilates  Social Functioning  Social Maturity:  Social Maturity: Responsible  Social Judgement:  Social Judgement: Normal  Stress  Stressors:  Stressors: Chiropodist, Work (Stress because Mom is in a nursing home)  Coping Ability:  Coping Ability: English as a second language teacher Deficits:     Supports:   His wife, Brad Miller  and his two children  Family and Psychosocial History: Family history Marital status: Married What types of issues is patient dealing with in the relationship?: Financial worries Are you sexually active?: No What is your sexual orientation?: heterosexual Has your sexual activity been affected by drugs, alcohol, medication, or emotional stress?: no Does patient have children?: Yes How many children?: 2 How is patient's relationship with their children?: good relationship  Childhood History:  Childhood History By whom was/is the patient raised?: Both parents Additional childhood history information: He was raised by both parents in [place], He had good relationship with his parents and good childhood.  Description of patient's relationship with caregiver when they were a child: see above Patient's description of current relationship with people who raised him/her: Mom went into a nursing home in August and this stressed him and father has passed How were you disciplined when you got in trouble as a child/adolescent?: He was disciplined with a belt or switch Does patient have siblings?: Yes Number of Siblings: 3 Description of patient's current relationship with siblings: He has good relationship with siblings, two in [place] and one in [place] Did patient suffer any verbal/emotional/physical/sexual abuse as a child?: No Did patient suffer from severe childhood neglect?: No Has patient ever been sexually abused/assaulted/raped as an adolescent or adult?: No Was the patient ever a victim of a crime or a disaster?: No Witnessed domestic violence?: No Has patient been effected by domestic violence as an adult?: No  CCA Part Two B  Employment/Work Situation: Employment / Work Copywriter, 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   Exercise/Diet: Exercise/Diet Do You Exercise?: No (He plans to start walking. ) Have You Gained or Lost A Significant Amount of Weight in the Past Six Months?: Yes-Lost Number of Pounds Lost?: 20 Do You Follow a Special Diet?: No Do You Have Any Trouble Sleeping?: No  Alcohol/Drug Use: Alcohol / Drug Use Pain Medications: denies Prescriptions: denies Over the Counter: denies History of alcohol / drug use?: No history of alcohol / drug abuse (Occassionally alcohol use-beer when he goes out to dinner about 2x  month)                      CCA Part Three  ASAM's:  Six Dimensions of Multidimensional Assessment  Dimension 1:  Acute Intoxication and/or Withdrawal Potential:     Dimension 2:  Biomedical Conditions and Complications:     Dimension 3:  Emotional, Behavioral, or Cognitive Conditions and Complications:     Dimension 4:  Readiness to Change:     Dimension 5:  Relapse, Continued use, or  Continued Problem Potential:     Dimension 6:  Recovery/Living Environment:      Substance use Disorder (SUD)    Social Function:  Social Functioning Social Maturity: Responsible Social Judgement: Normal  Stress:  Stress Stressors: Chiropodist, Work (Stress because Mom is in a  nursing home) Coping Ability: Overwhelmed Patient Takes Medications The Way The Doctor Instructed?: Yes Priority Risk: Moderate Risk  Risk Assessment- Self-Harm Potential: Risk Assessment For Dangerous to Others Potential Method: No Plan Availability of Means: Has close by Intent:  (n/a) Notification Required: No need or identified person Additional Comments for Danger to Others Potential: he reports to harm others  Risk Assessment -Dangerous to Others Potential: Risk Assessment For Dangerous to Others Potential Method: No Plan Availability of Means: Has close by Intent:  (n/a) Notification Required: No need or identified person Additional Comments for Danger to Others Potential: he reports to harm others   Visit Diagnosis: MDD (major depressive disorder), recurrent severe, without psychosis ([place])   DSM5 Diagnoses: Patient Active Problem List   Diagnosis Date Noted  . MDD (major depressive disorder), recurrent severe, without psychosis ([place]) 11/02/2015    Priority: Medium  . Panic attacks 11/02/2015  . Acute confusional state 10/31/2015  . Arthritis of knee, right 02/23/2014    Class: Chronic  . Arthritis of right knee 02/23/2014    Patient Centered Plan: Patient is on the following Treatment Plan(s):  Anxiety and Depression and Confusion  Recommendations for Services/Supports/Treatments: Recommendations for Services/Supports/Treatments Recommendations For Services/Supports/Treatments: Individual Therapy, Medication Management, Other (Comment) (May need further test to access for confusion)  Treatment Plan Summary:  See treatment plan scanned  Referrals to Alternative Service(s): Referred to Alternative Service(s):  Psychiatric evaluation Place:  [facility] Outpatient Date:  To be determined Time:    Referred to Alternative Service(s):  Medication Management Place: [facility] Outpatient Date: to be determined Time:    Referred to Alternative Service(s):   Therapy Place:  [facility] Outpatient  Date: To Be determined  Time:    Referred to Alternative Service(s):   Place:   Date:   Time:     Gotti Alwin A

## 2015-11-12 ENCOUNTER — Ambulatory Visit (INDEPENDENT_AMBULATORY_CARE_PROVIDER_SITE_OTHER): Payer: Medicare HMO | Admitting: Psychiatry

## 2015-11-12 ENCOUNTER — Encounter (HOSPITAL_COMMUNITY): Payer: Self-pay | Admitting: Psychiatry

## 2015-11-12 VITALS — BP 120/72 | HR 83 | Ht 68.0 in | Wt 266.4 lb

## 2015-11-12 DIAGNOSIS — F332 Major depressive disorder, recurrent severe without psychotic features: Secondary | ICD-10-CM

## 2015-11-12 DIAGNOSIS — F410 Panic disorder [episodic paroxysmal anxiety] without agoraphobia: Secondary | ICD-10-CM

## 2015-11-12 DIAGNOSIS — F411 Generalized anxiety disorder: Secondary | ICD-10-CM

## 2015-11-12 MED ORDER — BUPROPION HCL ER (SR) 150 MG PO TB12: 150.0000 mg | ORAL_TABLET | Freq: Two times a day (BID) | ORAL | Status: DC

## 2015-11-12 MED ORDER — CLONAZEPAM 0.5 MG PO TABS: 0.5000 mg | ORAL_TABLET | Freq: Every day | ORAL | Status: DC

## 2015-11-12 MED ORDER — TRAZODONE HCL 100 MG PO TABS: 100.0000 mg | ORAL_TABLET | Freq: Every day | ORAL | Status: DC

## 2015-11-12 MED ORDER — SERTRALINE HCL 25 MG PO TABS: 25.0000 mg | ORAL_TABLET | Freq: Every day | ORAL | Status: DC

## 2015-11-12 NOTE — Progress Notes (Signed)
Psychiatric Initial Adult Assessment   Patient Identification: Brad Miller MRN:  [id number] Date of Evaluation:  11/12/2015 Referral Source: [facility] H inpatient unit Chief Complaint:   depression and confusion Visit Diagnosis:    ICD-9-CM ICD-10-CM   1. MDD (major depressive disorder), recurrent severe, without psychosis ([place]) 296.33 F33.2   2. Panic attacks 300.01 F41.0    Diagnosis:   Patient Active Problem List   Diagnosis Date Noted  . MDD (major depressive disorder), recurrent severe, without psychosis ([place]) [F33.2] 11/02/2015  . Panic attacks [F41.0] 11/02/2015  . Acute confusional state [F05] 10/31/2015  . 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 Arthritis of right knee [M12.9] 02/23/2014   History of Present Illness:  64 year old white married male referred from [facility] H inpatient unit where he was brought 10 after getting lost and also had acute onset of loss of speech was confused rocking back and forth. He was admitted to the adult inpatient unit. An MRI was done which was found to be normal.  Patient has multiple stressors that are affecting his life, #1 is financial stressors. #2 he was cheating his boss was also his nephew. #3 he had an affair and then told his wife. #4 his mother has been admitted to the group home. Patient has been feeling overwhelmed with all of this. Admits to feeling depressed but mostly feels overwhelmed and anxious and states that his memory is poor and his concentration is bad.  Patient lives in [place] with his wife he has 2 children from his first wife. Patient has worried about the future his worried about the finances. Short-term memory is poor, tends to get confused at times. States that he cannot read and cannot get into the book. Also has no interest in watching TV as he does not like the new shows. Discussed the target market for the new shows are the younger generation. Patient also states that the money that he got from  the nephew he added [place] because he wanted to. Patient also states that sometimes his coordination is not the greatest but he has not fallen.  Patient drives when he has to, his wife drives him around. 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 Patient was continued on his Wellbutrin SR 150 twice a day, Zoloft 25 mg was added. Trazodone 100 mg and Klonopin 0.5 twice a day when necessary.  Since discharge he states that his sleep is good, appetite is good mood is anxious he tends to ruminate about cheating on his wife and putting his mother in the group home and also cheating on his nephew. Patient blames himself. He denies suicidal ideation denies homicidal ideation at times feels hopeless and helpless denies hallucinations or delusions.  At this point with his permission his wife joined [place] to provide further information.  Associated Signs/Symptoms: Depression Symptoms:  depressed mood, anhedonia, psychomotor retardation, fatigue, feelings of worthlessness/guilt, difficulty concentrating, hopelessness, anxiety, (Hypo) Manic Symptoms:  Distractibility, Anxiety Symptoms:  Excessive Worry, Psychotic Symptoms:  None PTSD Symptoms. NA Substance Abuse History in the last 12 months:  Yes.   patient's drinks 1-2 glasses of wine occasionally he is a social drinker.  Consequences of Substance Abuse: NA   Past psychiatric history.---------- Patient was hospitalized 20 years ago at [facility] psychiatric unit because of leaving his first wife to marry his current wife and felt guilty about it. No outpatient follow-up. Hospitalized at [facility] H adult unit at  [facility] from 11/01/2015 01/31/1659   Past Medical History: Reviewed as listed below Past Medical History  Diagnosis Date  . Hyperlipidemia     TAKES LOVASTATIN NIGHTLY  . Depression     TAKES WELLBUTRIN DAILY  . Anxiety     TAKES XANAX DAILY AS NEEDED  . Hypertension     TAKES CARDURA DAILY  .  Sleep apnea     USES CPAP  . Headache(784.0)     OCCASIONALLY  . Arthritis   . Joint pain   . Urinary frequency   . Urinary urgency   . Enlarged prostate   . Diabetes mellitus without complication (HCC)     BORDERLINE  . Cataracts, bilateral   . Rash     AROUND NOSE AND STATES D/T NOT USING LOTION WHILE SHAVING    Past Surgical History  Procedure Laterality Date  . 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 Total knee arthroplasty Right 02/23/2014    Procedure: TOTAL KNEE ARTHROPLASTY;  Surgeon: Kerin Salen, MD;  Location: [place];  Service: Orthopedics;  Laterality: Right;  . Knee arthroscopy Left 02/23/2014    Procedure: LEFT ARTHROSCOPY KNEE;  Surgeon: Kerin Salen, MD;  Location: [place];  Service: Orthopedics;  Laterality: Left;   Family History: Reviewed as listed below Family History  Problem Relation Age of Onset  . Hypertension Mother   . Heart attack Mother   . Osteoporosis Mother   . CVA Mother   . Coronary artery disease Mother   . Hypertension Father   . Emphysema Father   . Dementia Father     father tried to commit suicide when he had dementia  . Coronary artery disease Brother   . Diabetes Brother    Social History:  Patient lives with his wife in [place]  . Marital Status: Married    Spouse Name: N/A  . Number of Children: N/A  . Years of Education: N/A   Social History Main Topics  . Smoking status: Never Smoker   . Smokeless tobacco: None  . Alcohol Use: 0.6 oz/week    1 Cans of beer per week     Comment: OCCASIONALLY  . Drug Use: No  . Sexual Activity: Not Currently   Other Topics Concern  . None   Social History Narrative     Musculoskeletal: Strength & Muscle Tone: within normal limits Gait & Station: normal Patient leans: N/A  Psychiatric Specialty Exam: HPI  Review of Systems  Constitutional: Negative.   HENT: Negative.   Eyes: Negative.   Respiratory: Negative.   Cardiovascular: Negative.    Gastrointestinal: Negative.   Genitourinary: Negative.   Musculoskeletal: Negative.   Skin: Negative.   Neurological: Negative.   Endo/Heme/Allergies: Negative.   Psychiatric/Behavioral: Positive for depression and memory loss. The patient is nervous/anxious.     Blood pressure 120/72, pulse 83, height 5\' 8"  (1.727 m), weight 266 lb 6.4 oz (120.838 kg).Body mass index is 40.52 kg/(m^2).  General Appearance: Casual  Eye Contact:  Good  Speech:  Clear and Coherent and Normal Rate  Volume:  Normal  Mood:  Angry, Anxious, Depressed and Dysphoric  Affect:  Constricted and Depressed  Thought Process:  Goal Directed, Linear and Logical  Orientation:  Full (Time, Place, and Person)  Thought Content:  Rumination  Suicidal Thoughts:  No  Homicidal Thoughts:  No  Memory:  Immediate;   Poor Recent;   Poor Remote;   Fair  Judgement:  Fair  Insight:  Fair  Psychomotor Activity:  Normal  Concentration:  Fair  Recall:  Poor  1/3  Fund of Knowledge:Fair  Language: Good  Akathisia:  No  Handed:  Right  AIMS (if indicated):  0  Assets:  Communication Skills Desire for Improvement Housing Resilience Social Support Transportation  ADL's:  Intact  Cognition: WNL  Sleep:     Is the patient at risk to self?  No. Has the patient been a risk to self in the past 6 months?  Yes.   Has the patient been a risk to self within the distant past?  No. Is the patient a risk to others?  No. Has the patient been a risk to others in the past 6 months?  No. Has the patient been a risk to others within the distant past?  No.  Allergies:   reviewed as listed below  Allergies  Allergen Reactions  . Ciprofloxacin     GI side effects   . Hydrocodone-Acetaminophen     Itch/constipation  . Penicillins     Childhood allergy. Knot on arm Has patient had a PCN reaction causing immediate rash, facial/tongue/throat swelling, SOB or lightheadedness with hypotension: No Has patient had a PCN reaction  causing severe rash involving mucus membranes or skin necrosis: Yes Has patient had a PCN reaction that required hospitalization No Has patient had a PCN reaction occurring within the last 10 years: No If all of the above answers are "NO", then may pro  . Zocor [Simvastatin]     Back aches    Current Medications:reviewed as listed below  Current Outpatient Prescriptions  Medication Sig Dispense Refill  . buPROPion (WELLBUTRIN SR) 150 MG 12 hr tablet Take 1 tablet (150 mg total) by mouth 2 (two) times daily. 60 tablet 0  . Butalbital-APAP-Caffeine 50-325-40 MG/15ML SOLN Take by mouth 4 (four) times daily as needed. Patient takes a capsule.    . clonazePAM (KLONOPIN) 0.5 MG tablet Take 1 tablet (0.5 mg total) by mouth 2 (two) times daily as needed (anxiety). 10 tablet 0  . cycloSPORINE (RESTASIS) 0.05 % ophthalmic emulsion 1 drop 2 (two) times daily.    Marland Kitchen doxazosin (CARDURA) 8 MG tablet Take 0.5 tablets (4 mg total) by mouth daily.    Marland Kitchen ibuprofen (ADVIL,MOTRIN) 800 MG tablet Take 800 mg by mouth 3 (three) times daily.    . Omega-3 Fatty Acids (FISH OIL PO) Take by mouth 1 day or 1 dose. 4 capsules once a day    . POTASSIUM CHLORIDE PO Take by mouth.    . pyridOXINE (VITAMIN B-6) 50 MG tablet Take 50 mg by mouth 3 (three) times a week.    . sertraline (ZOLOFT) 25 MG tablet Take 1 tablet (25 mg total) by mouth daily. 30 tablet 0  . sildenafil (VIAGRA) 50 MG tablet Take 25-50 mg by mouth daily as needed for erectile dysfunction.    Marland Kitchen testosterone cypionate (DEPOTESTOSTERONE CYPIONATE) 200 MG/ML injection Inject 1.5 mLs (300 mg total) into the muscle every 28 (twenty-eight) days. To be managed outpatient (Patient taking differently: Inject into the muscle every 28 (twenty-eight) days. To be managed outpatient) 10 mL 0  . traZODone (DESYREL) 100 MG tablet Take 1 tablet (100 mg total) by mouth at bedtime as needed for sleep. 30 tablet 0  . TURMERIC PO Take by mouth 1 day or 1 dose.     No current  facility-administered medications for this visit.    Previous Psychotropic Medications: Yes  Medical Decision Making:  New problem, with additional work up planned, Review of Psycho-Social Stressors (1), Review or order clinical lab tests (1), Review and summation of old records (2), Review of Last Therapy Session (1) and Review of Medication Regimen & Side Effects (2)  Treatment Plan Summary: Medication management  treatment plan. #1 major depression recurrent severe Patient will continue Wellbutrin SR 150 mg twice a day. He'll continue Zoloft 25 mg daily. #2 insomnia Patient will continue trazodone 100 mg by mouth daily at bedtime. #3 generalized anxiety. Decrease Klonopin 0.5 mg by mouth daily at bedtime. It was discussed with the patient that this affects memory. #4 rule out dementia/neurological disorder. Patient is being referred to Dr. Ellouise Newer neurologist at [facility] neurology. #4 therapy. Patient is being referred to [facility] for therapy. #5 he'll return to see me in the clinic in one month call sooner if necessary. This was an initial visit of 60 minutes of high intensity. More than 50% of the time was spent in gathering information obtaining collateral information from the wife with the patient's permission and coordinating care with neurology and therapy. Discussed that he needed to forgive himself about all the stressors that have affected him recently. Coping skills and action alternatives to depression and anxiety was discussed. Interpersonal and supportive therapy was provided    Erin Sons 11/22/20169:19 AM

## 2015-11-18 ENCOUNTER — Ambulatory Visit (INDEPENDENT_AMBULATORY_CARE_PROVIDER_SITE_OTHER): Payer: Medicare HMO | Admitting: Licensed Clinical Social Worker

## 2015-11-18 DIAGNOSIS — F332 Major depressive disorder, recurrent severe without psychotic features: Secondary | ICD-10-CM

## 2015-11-18 DIAGNOSIS — F411 Generalized anxiety disorder: Secondary | ICD-10-CM | POA: Diagnosis not present

## 2015-11-18 NOTE — Progress Notes (Addendum)
   THERAPIST PROGRESS NOTE  Session Time: 1:00 PM-1:52 PM  Participation Level: Active  Behavioral Response: CasualAlertEuthymic  Type of Therapy: Individual Therapy  Treatment Goals addressed: Diagnosis: MDD recurrent severe  Interventions: Strength-based, Supportive and Reframing  Summary:  Brad Miller is a 64 year old male who presents for his follow up appointment. He still feels guilty and still having trouble focusing. Therapist discussed the process of self-forgiveness and how patient has taken steps in this process. He has been honest with the people he has harmed. Therapist also discussed how we become willing to make amends where possible but the emphasis is on willingness as it is not always possible to make amends. Patient related that his nephew seems to have forgiven him and therapist said that if others are willing to forgive him then he has to be willing to forgive himself. Therapist generalized patient's feelings to the fact of that everyone has made mistakes, so he can't single himself out. He can't single himself out the only person who has made mistakes. Therapist also worked on reframing and helping patient understand that the past is over so there is no use in trying to change things of the past. He has to focus now on the future, look ahead to how he can impact this in a positive way. Patient feels he did not come out of inpatient any better. He can't concentrate and confused. He doesn't see his way out of his situation. He is not sure he is capable of doing the work he did. He has all the bills that still have to be paid. Even if he could get to work he is not sure if he could make it. He worries about his wife in connection with this. If he can't do what he needs to do to bring in the money, then she can't get by either. Therapist engaged in strength based, reframing to the positive and problem solving. Therapist pointed out that it was positive that he has people to calling to fix  things, this would bring in some money, to pursue their requests as this could lead to other things. It is still unclear if there is memory loss, so the plan is to get the neuro psychiatric testing to know what is going on. Therapist also discussed having some acceptance about transitions in life and how his functioning would slow down. Patient related that he knew that this time would come but always avoided it and hoped it would never come. He related that he would willing to suggestions such as follow up on leads for working and following up on recommendations for testing because "he had nothing to lose" so why not.   Suicidal/Homicidal: No  Plan: 1.Return again in 2 weeks.2.Provide supportive interventions 3.Therapist help patient in problem solving and positive reframing.   Diagnosis: Axis I: Major Depression, Recurrent severe       Bowman,Mary A 11/18/2015

## 2015-11-19 ENCOUNTER — Telehealth (HOSPITAL_COMMUNITY): Payer: Self-pay

## 2015-11-20 ENCOUNTER — Telehealth: Payer: Self-pay | Admitting: Neurology

## 2015-11-20 NOTE — Telephone Encounter (Signed)
Sunday Spillers from [facility]/ [phone]//called to check if referral info has been received for pt

## 2015-11-20 NOTE — Telephone Encounter (Signed)
  Returned call to [place]. She wanted make sure we received patient's records for new appt visit on 12/26/14. We do have patient's records.

## 2015-12-02 ENCOUNTER — Ambulatory Visit (INDEPENDENT_AMBULATORY_CARE_PROVIDER_SITE_OTHER): Payer: Medicare HMO | Admitting: Licensed Clinical Social Worker

## 2015-12-02 DIAGNOSIS — F332 Major depressive disorder, recurrent severe without psychotic features: Secondary | ICD-10-CM | POA: Diagnosis not present

## 2015-12-02 DIAGNOSIS — F410 Panic disorder [episodic paroxysmal anxiety] without agoraphobia: Secondary | ICD-10-CM | POA: Diagnosis not present

## 2015-12-02 NOTE — Progress Notes (Signed)
THERAPIST PROGRESS NOTE  Session Time: 3:53 PM-4:44 PM  Participation Level: Active  Behavioral Response: CasualAlertDepressed  Type of Therapy: Individual Therapy  Treatment Goals addressed: Coping and Diagnosis: MDD  Interventions: CBT, DBT, Solution Focused, Supportive, Family Systems and Other: Problem solving, relaxation exercises  Summary: Patient reports that he has not done his neuropsychiatric testing yet. He updated therapist on job he was offered by former client to [place] work. He said that he was nervious but remembered what he was doing. He was able to put things together. It was not exactly the  the way it came apart but he was still able to put it together. He was relieved that it was over. 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 He wrote a list to talk to therapist about he was recognized there were things he couldn't change about his situation right now. Therapist compared it to the coping strategy of using the Serenity prayer and recognizing the things one can and cannot change. This means making a list of things you can and can't change and focusing on on things one can change. Patient recognizes that he can't change his situation right now and worry will not change anything. All he can do is the best thing he can do. He can't change his mom's situation. He discussed some of his worries. He said that there could be a financial problem coming up related to his mom. If the state doesn't approve the funding for the program she is under then the financial obligation will go back to the family. He identify this situation as something out of his control. With all the things that have happened to him recently it is hard for him to understand. He can't concentrate on reading things and says his comprehensive has decreased, he don't take any pleasure in television and he can't the television shows that his wife watches.  There is fearfulness to approach activities that he enjoys because he is afraid he will find he won't be able to enjoy it. Therapist said that it is unclear what underlying source that is contributing to this and if it related to depression or loss of brain functioning so it will helpful to get testing results. It would be helpful for [place] to work together to help in any case improvement of depression. Therapist explored with patient to find things he enjoys. Therapist encouraged him to get involved with social activities but he is resistant. He has financial worries on his mind and in the future how he is going to cope and what he is going to do. Therapist attempted to problem solve with him but unable to find an option at this point with his finances. At least if he can find something to do, it will take his mind off of worries for while. He has a poodle and he describes him as "that's my pleasure". His name is Kerri Perches and recognizes that he has not been the same lately.He does get outside to walk his poodle. As we explored options, he said that he would try to color to help relax him and therapist gave him relaxation and visualization exercises. As therapist was talking to patient she pointed out his critical statement and judgmental statements about himself. Theapist pointed out that this leads to negative things and stop him from attempting different activities. Patient made positive statements that therapist reinforced when he thought that this can be temporary  and he may be holding himself back. Therapist pointed out that his depression improves his functioning could improve and also to look at the things he could explore to enjoy right now. Patient mentioned physical problems where he has to go to the bathroom several times a day a little bit at a time. He also has had discomfort with his abdomen area but not currently. Therapist agreed it could be stress related but that he needs to see a doctor to rule out a  medical cause. He was resistant to seeing a doctor do to money but agreed that he would do better. Therapist encouraged him to see that as he feels better he will function better.      Suicidal/Homicidal: No   Plan: 1.Return again in 1 weeks.2.Explore activities that he will enjoy and are relaxing such as coloring and relaxation exercises given to him by therapist.3.Patient complete testing to rule out medical reasons for problems in memory and concentration.4.patient learn and implement healthy coping strategies to manage stressors and depression.4.Patient see doctor to rule out medical causes for symptoms related to going to the bathroom several times a day at having at times discomfort with is abdomen.   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

## 2015-12-09 ENCOUNTER — Ambulatory Visit (INDEPENDENT_AMBULATORY_CARE_PROVIDER_SITE_OTHER): Payer: Medicare HMO | Admitting: Licensed Clinical Social Worker

## 2015-12-09 DIAGNOSIS — F410 Panic disorder [episodic paroxysmal anxiety] without agoraphobia: Secondary | ICD-10-CM | POA: Diagnosis not present

## 2015-12-09 DIAGNOSIS — F332 Major depressive disorder, recurrent severe without psychotic features: Secondary | ICD-10-CM

## 2015-12-09 DIAGNOSIS — F05 Delirium due to known physiological condition: Secondary | ICD-10-CM

## 2015-12-09 DIAGNOSIS — F411 Generalized anxiety disorder: Secondary | ICD-10-CM | POA: Diagnosis not present

## 2015-12-09 NOTE — Progress Notes (Signed)
THERAPIST PROGRESS NOTE  Session Time: 1: 00 PM-1:52 PM  Participation Level: Active  Behavioral Response: CasualAlertDysphoric  Type of Therapy: Individual Therapy  Treatment Goals addressed: Anxiety, Coping and Diagnosis: MDD, Generalized Anxiety, Panic Attacks, Acute Confusional State  Interventions: CBT, Solution Focused, Strength-based, Supportive, Reframing and Other: Coping skills  Summary: Patient presents with anhedonia, "problems with nerves", lack of self-esteem, and poor sleep. We reviewed his insight from last session about letting go of what he can't control. Even though he knows he can't control it, he can't stop worrying about it.  He does not see any solutions for managing his finances. He feels like he is in [place] knot. Every way he turns there are expenses. He is unsure how he is going to pay bills in Miller few months. Therapist discussed cognitive distortions and that he is jumping to conclusions and he doesn't know how bad his memory really is. His neuropsych testing is 12/25/14. Patient discussed some things he has been reading and therapist pointed out that this indicates he does have comprehension of materials. Even if has declined, this is to be expected as we get older and this does not mean that he has lost all his ability. Therapist generalized this to everyone who gets older loses functioning but are still able to perform adequate to do their job. Therapist pointed out that he seems too self-critical. Brad Miller relates that he can't take pleasure in television. Therapist said to find something he enjoys. He said that everything has Miller different slant not that prevents him from enjoying things. Therapist pointed out that his symptoms of being self-critical and lack of enjoyment appear related to depression. We talked about activities that help him with depression. Therapist pointed out doing the basics such as getting enough to eat, sleep, and exercise. Patient explains that with  everything he feels like he is getting through the day. He can't wait to go to bed. He wants to escape. He has to play mind games to go to sleep. He went to the park and he can't shut down his worries. He said even if he had the fiances he would still be depressed and anxious. He thinks he would be full of anxiety, not comfortable with himself, not sleeping right. He said that his real problem is his nerves. His stomach is tight and he has to pee more often then it should. Therapist reviewed relaxation exercises and said it is one of the foundations of Miller program undertaken to overcome anxiety. Patient said that he does take deep breaths when he sits in his chair to help him relax. Therapist also reviewed another coping skill for anxiety is reviewing our self-talk and challenging our negative perceptions. We discussed his finances again as this is Miller major stressor. 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 Patient says he has lost confidence. Therapist explored with him the reason for lack of confidence. She pointed out that he is too self-critical. Patient questioned himself and therapist agreed that he could be holding himself back from getting well. He thinks he lost his confidence in [place]. Bad stuff happened that prevented him for doing the job that he could normally do.  He is not sure if he will be able to let go of guilt for things he has done. Therapist explained that it has to forgive himself and accept that he is fallible but accept it is Miller process.   Suicidal/Homicidal:  No  Therapist Response: Therapist used CBT strategies to patient to help identify cognitive distortions and challenge self-talk. For example, patient is engaged in negative forecasting when he still has not taken his neuropsychiatric tests. Therapist pointed out that patient is too self-critical, and this may be holding him back from from performing at his jobs as well as increasing his  anxiety and depression. Therapist encouraged patient to lower expectations on himself that are still acceptable ones for his age. Therapist discussed taking care of himself that will help with depression such as eating right, regular sleep and exercise. Therapist explored source of lack of confidence and traced it back to feelings of guilt for stealing from nephew. Therapist continues to work with patient to help him through feelings of guilt. Therapist also worked on problem solving with patient related to his fiances. Therapist encouraged patient to find activities that he can enjoy that will help improve mood.   Plan: 1.Return again in 2 weeks.2.Patient work on strategies to improve anxiety and depression including challenging negative self-talk and negative self-evaluations, engaging in positive activities and continuing to work though guilt.   Diagnosis: Axis I: Generalized Anxiety Disorder, Major Depression, Recurrent severe and Panic Disorder, Acute Confusional State       Brad Miller,Brad Miller 12/09/2015

## 2015-12-17 ENCOUNTER — Ambulatory Visit (HOSPITAL_COMMUNITY): Payer: Self-pay | Admitting: Psychiatry

## 2015-12-22 DIAGNOSIS — C801 Malignant (primary) neoplasm, unspecified: Secondary | ICD-10-CM

## 2015-12-22 HISTORY — DX: Malignant (primary) neoplasm, unspecified: C80.1

## 2015-12-27 ENCOUNTER — Ambulatory Visit (INDEPENDENT_AMBULATORY_CARE_PROVIDER_SITE_OTHER): Payer: PPO | Admitting: Neurology

## 2015-12-27 ENCOUNTER — Encounter: Payer: Self-pay | Admitting: Neurology

## 2015-12-27 VITALS — BP 110/78 | HR 72 | Ht 69.0 in | Wt 262.0 lb

## 2015-12-27 DIAGNOSIS — F32A Depression, unspecified: Secondary | ICD-10-CM

## 2015-12-27 DIAGNOSIS — R413 Other amnesia: Secondary | ICD-10-CM

## 2015-12-27 DIAGNOSIS — F329 Major depressive disorder, single episode, unspecified: Secondary | ICD-10-CM | POA: Insufficient documentation

## 2015-12-27 NOTE — Progress Notes (Signed)
NEUROLOGY CONSULTATION NOTE  Brad Miller MRN: [id number] DOB: October 09, 1951  Referring provider: Dr. Erin Sons Primary care provider: Carlos Levering, PA-C  Reason for consult:  Memory loss  Dear Dr Salem Senate:  Thank you for your kind referral of Brad Miller for consultation of the above symptoms. Although his history is well known to you, please allow me to reiterate it for the purpose of our medical record. Records and images were personally reviewed where available.  HISTORY OF PRESENT ILLNESS: This is a 65 year old right-handed man with a history of hypertension, hyperlipidemia, sleep apnea, depression, in his usual state of health until November 2016 when he started having problems at work. 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 He decided to go back home when he got lost in [place] and needed his brother to come and get him. Since then, his wife had noticed a change in behavior, he would be confused and unable to make decisions. At one point he was talking non-stop and repeating himself over and over again, then the next day he stopped talking altogether and needed his wife to direct him to do things, even how to use his CPAP. He was brought to [facility] ER on 10/30/15 where he was evaluated by Neurology, I personally reviewed MRI brain without contrast which was degraded by motion but did not show any acute intracranial abnormalities. Bloodwork showed normal TSH and B12 levels. He was admitted to inpatient psychiatry until 11/06/15 and medications were adjusted.  He presents today to rule out underlying dementia/neurological disorder potentially causing memory changes. He reports that since hospital discharge, he does feel better but still does not feel near well. He is still nervous and worried about a lot of things, with trouble concentrating when reading, he tries to watch TV but does not understand the  shows,"nothing seems right." He is not sleeping well. His wife tells him that she sees him getting better but he does not feel this way, afraid to tell her how he feels because it would worry and upset her. He shares that he is in a financial situation right now that he cannot see a way out of, he has bills coming in with no income, and does not know when income will come, stating it will be very difficult for him to go back to work. He states he does not have the confidence for this. He also reports feeling hopeless. No suicidal or homicidal ideation.  He denies any headaches, dizziness, diplopia, dysarthria, dysphagia, neck pain, focal numbness/tingling/weakness, bowel/bladder dysfunction, anosmia, or tremors. He denies any visual or auditory hallucinations. He has some back pain and constipation. His mother had memory problems. He denies any significant head injuries, occasional alcohol intake.  Laboratory Data: Lab Results  Component Value Date   WBC 8.0 10/31/2015   HGB 15.3 10/31/2015   HCT 45.4 10/31/2015   MCV 90.1 10/31/2015   PLT 256 10/31/2015     Chemistry      Component Value Date/Time   NA 137 10/31/2015 1521   K 4.0 10/31/2015 1521   CL 101 10/31/2015 1521   CO2 24 10/31/2015 1521   BUN 12 10/31/2015 1521   CREATININE 0.94 10/31/2015 1521      Component Value Date/Time   CALCIUM 9.7 10/31/2015 1521   ALKPHOS 70 10/31/2015 1521   AST 15 10/31/2015 1521   ALT 39 10/31/2015 1521   BILITOT 0.9 10/31/2015  25     Lab Results  Component Value Date   TSH 1.523 11/03/2015   Lab Results  Component Value Date   VITAMINB12 2074* 11/03/2015     PAST MEDICAL HISTORY: Past Medical History  Diagnosis Date  . Hyperlipidemia     TAKES LOVASTATIN NIGHTLY  . Depression     TAKES WELLBUTRIN DAILY  . Anxiety     TAKES XANAX DAILY AS NEEDED  . Hypertension     TAKES CARDURA DAILY  . Sleep apnea     USES CPAP  . Headache(784.0)     OCCASIONALLY  . Arthritis   . Joint  pain   . Urinary frequency   . Urinary urgency   . Enlarged prostate   . Diabetes mellitus without complication (HCC)     BORDERLINE  . Cataracts, bilateral   . Rash     AROUND NOSE AND STATES D/T NOT USING LOTION WHILE SHAVING    PAST SURGICAL HISTORY: Past Surgical History  Procedure Laterality Date  . 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 Knee arthroscopy Left 02/23/2014    Procedure: LEFT ARTHROSCOPY KNEE;  Surgeon: Kerin Salen, MD;  Location: [place] [facility];  Service: Orthopedics;  Laterality: Left;  . Cataract extraction Bilateral     MEDICATIONS: Current Outpatient Prescriptions on File Prior to Visit  Medication Sig Dispense Refill  . buPROPion (WELLBUTRIN SR) 150 MG 12 hr tablet Take 1 tablet (150 mg total) by mouth 2 (two) times daily. 60 tablet 2  . Butalbital-APAP-Caffeine 50-325-40 MG/15ML SOLN Take by mouth 4 (four) times daily as needed. Patient takes a capsule.    . clonazePAM (KLONOPIN) 0.5 MG tablet Take 1 tablet (0.5 mg total) by mouth at bedtime. 30 tablet 2  . cycloSPORINE (RESTASIS) 0.05 % ophthalmic emulsion 1 drop 2 (two) times daily.    Marland Kitchen doxazosin (CARDURA) 8 MG tablet Take 0.5 tablets (4 mg total) by mouth daily.    Marland Kitchen ibuprofen (ADVIL,MOTRIN) 800 MG tablet Take 800 mg by mouth as needed.     . Omega-3 Fatty Acids (FISH OIL PO) Take by mouth 1 day or 1 dose. 4 capsules once a day    . POTASSIUM CHLORIDE PO Take by mouth.    . sertraline (ZOLOFT) 25 MG tablet Take 1 tablet (25 mg total) by mouth daily. 30 tablet 2  . testosterone cypionate (DEPOTESTOSTERONE CYPIONATE) 200 MG/ML injection Inject 1.5 mLs (300 mg total) into the muscle every 28 (twenty-eight) days. To be managed outpatient (Patient taking differently: Inject into the muscle every 28 (twenty-eight) days. To be managed outpatient) 10 mL 0   . traZODone (DESYREL) 100 MG tablet Take 1 tablet (100 mg total) by mouth at bedtime. 30 tablet 2  . TURMERIC PO Take by mouth 1 day or 1 dose.    . pyridOXINE (VITAMIN B-6) 50 MG tablet Take 50 mg by mouth 3 (three) times a week.     No current facility-administered medications on file prior to visit.    ALLERGIES: Allergies  Allergen Reactions  . Ciprofloxacin     GI side effects   . Hydrocodone-Acetaminophen     Itch/constipation  . Penicillins     Childhood allergy. Knot on arm Has patient had a PCN reaction causing immediate rash, facial/tongue/throat swelling, SOB or lightheadedness with hypotension: No  Has patient had a PCN reaction causing severe rash involving mucus membranes or skin necrosis: No Has patient had a PCN reaction that required hospitalization No Has patient had a PCN reaction occurring within the last 10 years: No If all of the above answers are "NO", then may pro  . Zocor [Simvastatin]     Back aches     FAMILY HISTORY: Family History  Problem Relation Age of Onset  . Hypertension Mother   . Heart attack Mother   . Osteoporosis Mother   . CVA Mother   . Coronary artery disease Mother   . Hypertension Father   . Emphysema Father   . Dementia Father     father tried to commit suicide when he had dementia  . Coronary artery disease Brother   . Diabetes Brother     SOCIAL HISTORY: Social History   Social History  . Marital Status: Married    Spouse Name: N/A  . Number of Children: 2  . Years of Education: N/A   Occupational History  . Not Working    Social History Main Topics  . Smoking status: Never Smoker   . Smokeless tobacco: Never Used  . Alcohol Use: 0.6 oz/week    1 Cans of beer per week     Comment: Rare  . Drug Use: No  . Sexual Activity: Not Currently   Other Topics Concern  . Not on file   Social History Narrative    REVIEW OF SYSTEMS: Constitutional: No fevers, chills, or sweats, no generalized fatigue, change in  appetite Eyes: No visual changes, double vision, eye pain Ear, nose and throat: No hearing loss, ear pain, nasal congestion, sore throat Cardiovascular: No chest pain, palpitations Respiratory:  No shortness of breath at rest or with exertion, wheezes GastrointestinaI: No nausea, vomiting, diarrhea, abdominal pain, fecal incontinence Genitourinary:  No dysuria, urinary retention or frequency Musculoskeletal:  No neck pain,+ back pain Integumentary: No rash, pruritus, skin lesions Neurological: as above Psychiatric: + depression, insomnia, anxiety Endocrine: No palpitations, fatigue, diaphoresis, mood swings, change in appetite, change in weight, increased thirst Hematologic/Lymphatic:  No anemia, purpura, petechiae. Allergic/Immunologic: no itchy/runny eyes, nasal congestion, recent allergic reactions, rashes  PHYSICAL EXAM: Filed Vitals:   12/27/15 0833  BP: 110/78  Pulse: 72   General: No acute distress Head:  Normocephalic/atraumatic Eyes: Fundoscopic exam shows bilateral sharp discs, no vessel changes, exudates, or hemorrhages Neck: supple, no paraspinal tenderness, full range of motion Back: No paraspinal tenderness Heart: regular rate and rhythm Lungs: Clear to auscultation bilaterally. Vascular: No carotid bruits. Skin/Extremities: No rash, no edema Neurological Exam: Mental status: alert and oriented to person, place, and time, no dysarthria or aphasia, Fund of knowledge is appropriate.  Remote memory intact.  Attention and concentration are normal.    Able to name objects and repeat phrases. CDT 5/5  MMSE - Mini Mental State Exam 12/27/2015  Orientation to time 5  Orientation to Place 5  Registration 3  Attention/ Calculation 5  Recall 0  Language- name 2 objects 2  Language- repeat 1  Language- follow 3 step command 3  Language- read & follow direction 1  Write a sentence 1  Copy design 1  Total score 27   Cranial nerves: CN I: not tested CN II: pupils equal,  round and reactive to light, visual fields intact, fundi unremarkable. CN III, IV, VI:  full range of motion, no nystagmus, no ptosis CN V: facial sensation intact CN VII: upper and lower face  symmetric CN VIII: hearing intact to finger rub CN IX, X: gag intact, uvula midline CN XI: sternocleidomastoid and trapezius muscles intact CN XII: tongue midline Bulk & Tone: normal, no fasciculations. Motor: 5/5 throughout with no pronator drift. Sensation: intact to light touch, cold, pin, vibration and joint position sense.  No extinction to double simultaneous stimulation.  Romberg test negative Deep Tendon Reflexes: +2 throughout, no ankle clonus Plantar responses: downgoing bilaterally Cerebellar: no incoordination on finger to nose, heel to shin. No dysdiadochokinesia Gait: narrow-based and steady, slight difficulty with tandem walk but able Tremor: none  IMPRESSION: This is a 65 year old right-handed man with a history of hypertension, hyperlipidemia, sleep apnea, who was admitted last November 2016 for a change in memory and personality. He was admitted at inpatient Psychiatry for Major Depressive Disorder. He continues to express depression symptoms, including feeling of hopelessness, anxiety, insomnia. His neurological exam is normal, MMSE normal 27/30. MRI brain no acute changes. We discussed that there is no evidence of dementia at this time. 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 We had an extensive discussion regarding treatment of underlying cause, particularly regular follow-up with Dr. Salem Senate and his therapist. He expressed concern about finances and will discuss his options with psychiatry so he can proceed with the treatment he needs. He knows to call our office for any change in symptoms and will follow-up on an as needed basis.   Thank you for allowing me to participate in the care of this patient. Please do not hesitate  to call for any questions or concerns.   Ellouise Newer, M.D.  CC: Dr. Salem Senate, Carlos Levering

## 2015-12-27 NOTE — Patient Instructions (Addendum)
1. There is no evidence of dementia at this time. Memory changes can be seen with depression, anxiety, and stress. Strongly recommend reconnecting with your psychiatrist and seeing a therapist.

## 2015-12-31 ENCOUNTER — Ambulatory Visit (INDEPENDENT_AMBULATORY_CARE_PROVIDER_SITE_OTHER): Payer: PPO | Admitting: Psychiatry

## 2015-12-31 ENCOUNTER — Encounter (HOSPITAL_COMMUNITY): Payer: Self-pay | Admitting: Psychiatry

## 2015-12-31 VITALS — BP 140/78 | HR 73 | Ht 69.0 in | Wt 259.4 lb

## 2015-12-31 DIAGNOSIS — F410 Panic disorder [episodic paroxysmal anxiety] without agoraphobia: Secondary | ICD-10-CM | POA: Diagnosis not present

## 2015-12-31 DIAGNOSIS — F411 Generalized anxiety disorder: Secondary | ICD-10-CM | POA: Diagnosis not present

## 2015-12-31 DIAGNOSIS — F332 Major depressive disorder, recurrent severe without psychotic features: Secondary | ICD-10-CM | POA: Diagnosis not present

## 2015-12-31 DIAGNOSIS — F429 Obsessive-compulsive disorder, unspecified: Secondary | ICD-10-CM

## 2015-12-31 MED ORDER — RISPERIDONE 0.25 MG PO TABS: 0.2500 mg | ORAL_TABLET | Freq: Two times a day (BID) | ORAL | Status: DC

## 2015-12-31 NOTE — Progress Notes (Signed)
[facility] MD Progress Note.  Patient Identification: Brad Miller MRN:  [id number] Date of Evaluation:   12/31/2015  Chief Complaint:   depression and  rumination Visit Diagnosis:     ICD-9-CM ICD-10-CM   1. Generalized anxiety disorder 300.02 F41.1   2. Panic attacks 300.01 F41.0   3. MDD (major depressive disorder), recurrent severe, without psychosis ([place]) 296.33 F33.2    Diagnosis:   Patient Active Problem List   Diagnosis Date Noted  . Generalized anxiety disorder [F41.1] 11/12/2015    Priority: High  . Memory changes [R41.3] 12/27/2015  . Depression [F32.9] 12/27/2015  . MDD (major depressive disorder), recurrent severe, without psychosis ([place]) [F33.2] 11/02/2015  . Panic attacks [F41.0] 11/02/2015  . Acute confusional state [F05] 10/31/2015  . 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 Arthritis of right knee [M12.9] 02/23/2014   History of Present Illness:  Patient seen today for medication follow-up states his doing poorly. Reports that he has seen Dr. Delice Lesch who cleared him neurologically. Patient states that he feels depressed and ruminates about his finances. Reports that his sleep is poor as he dreams about his financial problems he is nervous and anxious feels helpless does not know what to do to solve the financial problems. States that he has said rituals and does things in a certain order. Patient appears to have OCD.    reports that he does not want to be with others except his wife and does not want to go out. Reports that he is functioning by getting money out of his retirement and he does not like.    sleep is disturbed , appetite is  Poor but he forces himself to eat, mood is anxious he tends to ruminate and obsess about things. Denies suicidal ideation no homicidal ideation. No hallucinations or delusions.   discussed starting Risperdal for his rumination and OCD and patient gave informed consent will DC Zoloft                                                                               Notes from initial visit on 39 /52/[birth year] 65 year old white married male referred from [facility] H inpatient unit where he was brought 10 after getting lost and also had acute onset of loss of speech was confused rocking back and forth. He was admitted to the adult inpatient unit. An MRI was done which was found to be normal.  Patient has multiple stressors that are affecting his life, #1 is financial stressors. #2 he was cheating his boss was also his nephew. #3 he had an affair and then told his wife. #4 his mother has been admitted to the group home. Patient has been feeling overwhelmed with all of this. Admits to feeling depressed but mostly feels overwhelmed and anxious and states that his memory is poor and his concentration is bad.  Patient lives in [place] with his wife he has 2 children from his first wife. Patient has worried about the future his worried about the finances. Short-term memory is poor, tends to get confused at times. States that he cannot read and cannot get into the book. Also has no interest in watching TV as  he does not like the new shows. Discussed the target market for the new shows are the younger generation. Patient also states that the money that he got from the nephew he added [place] because he wanted to. Patient also states that sometimes his coordination is not the greatest but he has not fallen.  Patient drives when he has to, his wife drives him around. 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 Patient was continued on his Wellbutrin SR 150 twice a day, Zoloft 25 mg was added. Trazodone 100 mg and Klonopin 0.5 twice a day when necessary.  Since discharge he states that his sleep is good, appetite is good mood is anxious he tends to ruminate about cheating on his wife and putting his mother in the group home and also cheating on his nephew. Patient blames himself. He denies suicidal ideation denies  homicidal ideation at times feels hopeless and helpless denies hallucinations or delusions.  At this point with his permission his wife joined [place] to provide further information.  Substance Abuse History in the last 12 months:  Yes.   patient's drinks 1-2 glasses of wine occasionally he is a social drinker.  Consequences of Substance Abuse: NA   Past psychiatric history.---------- Patient was hospitalized 20 years ago at [facility] psychiatric unit because of leaving his first wife to marry his current wife and felt guilty about it. No outpatient follow-up. Hospitalized at [facility] H adult unit at [facility] from 11/01/2015 01/31/1659   Past Medical History: Reviewed as listed below Past Medical History  Diagnosis Date  . Hyperlipidemia     TAKES LOVASTATIN NIGHTLY  . Depression     TAKES WELLBUTRIN DAILY  . Anxiety     TAKES XANAX DAILY AS NEEDED  . Hypertension     TAKES CARDURA DAILY  . Sleep apnea     USES CPAP  . Headache(784.0)     OCCASIONALLY  . Arthritis   . Joint pain   . Urinary frequency   . Urinary urgency   . Enlarged prostate   . Diabetes mellitus without complication (HCC)     BORDERLINE  . Cataracts, bilateral   . Rash     AROUND NOSE AND STATES D/T NOT USING LOTION WHILE SHAVING    Past Surgical History  Procedure Laterality Date  . 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 Total knee arthroplasty Right 02/23/2014    Procedure: TOTAL KNEE ARTHROPLASTY;  Surgeon: Kerin Salen, MD;  Location: [place];  Service: Orthopedics;  Laterality: Right;  . Knee arthroscopy Left 02/23/2014    Procedure: LEFT ARTHROSCOPY KNEE;  Surgeon: Kerin Salen, MD;  Location: [place];  Service: Orthopedics;  Laterality: Left;  . Cataract extraction Bilateral    Family History: Reviewed as listed below Family History  Problem Relation Age of Onset  . Hypertension Mother   . Heart attack Mother   . Osteoporosis Mother   . CVA Mother   . Coronary artery disease Mother   .  Hypertension Father   . Emphysema Father   . Dementia Father     father tried to commit suicide when he had dementia  . Coronary artery disease Brother   . Diabetes Brother    Social History:  Patient lives with his wife in [place]  . Marital Status: Married    Spouse Name: N/A  . Number of Children: 2  . Years of  Education: N/A   Occupational History  . Not Working    Social History Main Topics  . Smoking status: Never Smoker   . Smokeless tobacco: Never Used  . Alcohol Use: 0.6 oz/week    1 Cans of beer per week     Comment: Rare  . Drug Use: No  . Sexual Activity: Not Currently   Other Topics Concern  . Not on file   Social History Narrative     Musculoskeletal: Strength & Muscle Tone: within normal limits Gait & Station: normal Patient leans: N/A  Psychiatric Specialty Exam: HPI  Review of Systems  Constitutional: Negative.   HENT: Negative.   Eyes: Negative.   Respiratory: Negative.   Cardiovascular: Negative.   Gastrointestinal: Negative.   Genitourinary: Negative.   Musculoskeletal: Negative.   Skin: Negative.   Neurological: Negative.   Endo/Heme/Allergies: Negative.   Psychiatric/Behavioral: Positive for depression and memory loss. The patient is nervous/anxious.     Blood pressure 140/78, pulse 73, height 5\' 9"  (1.753 m), weight 259 lb 6.4 oz (117.663 kg).Body mass index is 38.29 kg/(m^2).  General Appearance: Casual  Eye Contact:  Good  Speech:  Clear and Coherent and Normal Rate  Volume:  Normal  Mood:  Angry, Anxious, Depressed and Dysphoric  Affect:  Constricted and Depressed  Thought Process:  Goal Directed, Linear and Logical  Orientation:  Full (Time, Place, and Person)  Thought Content:  Rumination and up sessions  Suicidal Thoughts:  No  Homicidal Thoughts:  No  Memory:  Immediate;   Poor Recent;   Poor Remote;   Fair  Judgement:  Fair  Insight:  Fair  Psychomotor Activity:  Normal   Concentration:  Fair  Recall:  Poor  1/3  Fund of Knowledge:Fair  Language: Good  Akathisia:  No  Handed:  Right  AIMS (if indicated):  0  Assets:  Communication Skills Desire for Improvement Housing Resilience Social Support Transportation  ADL's:  Intact  Cognition: WNL  Sleep:     Is the patient at risk to self?  No. Has the patient been a risk to self in the past 6 months?  Yes.   Has the patient been a risk to self within the distant past?  No. Is the patient a risk to others?  No. Has the patient been a risk to others in the past 6 months?  No. Has the patient been a risk to others within the distant past?  No.  Allergies:   reviewed as listed below  Allergies  Allergen Reactions  . Ciprofloxacin     GI side effects   . Hydrocodone-Acetaminophen     Itch/constipation  . Penicillins     Childhood allergy. Knot on arm Has patient had a PCN reaction causing immediate rash, facial/tongue/throat swelling, SOB or lightheadedness with hypotension: No Has patient had a PCN reaction causing severe rash involving mucus membranes or skin necrosis: No Has patient had a PCN reaction that required hospitalization No Has patient had a PCN reaction occurring within the last 10 years: No If all of the above answers are "NO", then may pro  . Zocor [Simvastatin]     Back aches    Current Medications:reviewed as listed below  Current Outpatient Prescriptions  Medication Sig Dispense Refill  . buPROPion (WELLBUTRIN SR) 150 MG 12 hr tablet Take 1 tablet (150 mg total) by mouth 2 (two) times daily. 60 tablet 2  . Butalbital-APAP-Caffeine 50-325-40 MG/15ML SOLN Take by mouth 4 (four) times daily as needed.  Patient takes a capsule.    . clonazePAM (KLONOPIN) 0.5 MG tablet Take 1 tablet (0.5 mg total) by mouth at bedtime. 30 tablet 2  . cycloSPORINE (RESTASIS) 0.05 % ophthalmic emulsion 1 drop 2 (two) times daily.    Marland Kitchen doxazosin (CARDURA) 8 MG tablet Take 0.5 tablets (4 mg total) by  mouth daily.    Marland Kitchen ibuprofen (ADVIL,MOTRIN) 800 MG tablet Take 800 mg by mouth as needed.     . Omega-3 Fatty Acids (FISH OIL PO) Take by mouth 1 day or 1 dose. 4 capsules once a day    . POTASSIUM CHLORIDE PO Take by mouth.    . pyridOXINE (VITAMIN B-6) 50 MG tablet Take 50 mg by mouth 3 (three) times a week.    . sertraline (ZOLOFT) 25 MG tablet Take 1 tablet (25 mg total) by mouth daily. 30 tablet 2  . testosterone cypionate (DEPOTESTOSTERONE CYPIONATE) 200 MG/ML injection Inject 1.5 mLs (300 mg total) into the muscle every 28 (twenty-eight) days. To be managed outpatient (Patient taking differently: Inject into the muscle every 28 (twenty-eight) days. To be managed outpatient) 10 mL 0  . traZODone (DESYREL) 100 MG tablet Take 1 tablet (100 mg total) by mouth at bedtime. 30 tablet 2  . TURMERIC PO Take by mouth 1 day or 1 dose.     No current facility-administered medications for this visit.    Previous Psychotropic Medications: Yes     Medical Decision Making:  New problem, with additional work up planned, Review of Psycho-Social Stressors (1), Review or order clinical lab tests (1), Review and summation of old records (2), Review of Last Therapy Session (1) and Review of Medication Regimen & Side Effects (2)  Treatment Plan Summary: Medication management  treatment plan. #1 major depression recurrent severe Patient will continue Wellbutrin SR 150 mg twice a day.  DC Zoloft  #2 insomnia Patient will continue trazodone 100 mg by mouth daily at bedtime. #3 generalized anxiety. And obsessions  Start Risperdal 0.25 mg by mouth twice a day , discussed the rationale risks benefits options of Risperdal and patient gave informed consent  continue Klonopin 0.5 mg by mouth daily at bedtime. . #4 therapy. Patient is being referred to [facility] for therapy. #5 he'll return to see me in the clinic in  2 weeks call sooner if necessary. This was an initial visit of  30 minutes of high intensity.  More than 50% of the time was spent  In reassuring the patient that he will get better and also discussing medications and diagnosis.. Discussed that he needed to forgive himself about all the stressors that have affected him recently. Coping skills and action alternatives to depression and anxiety was discussed. Interpersonal and supportive therapy was provided    Erin Sons 1/10/201711:03 AM

## 2016-01-06 ENCOUNTER — Encounter (HOSPITAL_COMMUNITY): Payer: Self-pay | Admitting: Clinical

## 2016-01-06 ENCOUNTER — Ambulatory Visit (INDEPENDENT_AMBULATORY_CARE_PROVIDER_SITE_OTHER): Payer: PPO | Admitting: Clinical

## 2016-01-06 DIAGNOSIS — F411 Generalized anxiety disorder: Secondary | ICD-10-CM

## 2016-01-06 DIAGNOSIS — F332 Major depressive disorder, recurrent severe without psychotic features: Secondary | ICD-10-CM

## 2016-01-06 NOTE — Progress Notes (Signed)
Comprehensive Clinical Assessment (CCA) Note  01/06/2016 Brad Miller [id number]  Visit Diagnosis:      ICD-9-CM ICD-10-CM   1. MDD (major depressive disorder), recurrent severe, without psychosis ([place]) 296.33 F33.2   2. Generalized anxiety disorder 300.02 F41.1       CCA Part One  Part One has been completed on paper by the patient.  (See scanned document in Chart Review)  CCA Part Two A  Intake/Chief Complaint:  CCA Intake With Chief Complaint CCA Part Two Date: 01/06/16 CCA Part Two Time: 1202 Chief Complaint/Presenting Problem: Depression, Anxiety, I know something just is not riight with me. Memory problems,  Patients Currently Reported Symptoms/Problems: Depression, anxiety, memory problems, financial issues, lost ability to work Individual's Strengths: "I just don't know" Individual's Preferences: "I would really like a miricle. I just don't know how things can get better." Type of Services Patient Feels Are Needed: Individual therapy  Initial Clinical Notes/Concerns: Reports spening his days making it from one meal to the next, waiting for time to go to bed, though he does not sleep well.  He shared that he isolates and does not want to speak to anyone.  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 He cheated his nephew ( did not give details) told his nephew who treated him fair  . When he drove home he got lost and son had to come get him.  Went to hospital confused and unable to talk. went to inpatient for several days,  Mental Health Symptoms Depression:  Depression: Change in energy/activity, Difficulty Concentrating, Fatigue, Hopelessness, Increase/decrease in appetite, Irritability, Sleep (too much or little), Tearfulness, Weight gain/loss, Worthlessness (Isolates "I do not want to be around anybody, not even people I love and love me.)  Mania:  Mania: N/A  Anxiety:   Anxiety: Difficulty concentrating, Fatigue, Irritability,  Tension, Restlessness, Worrying  Psychosis:     Trauma:  Trauma: Avoids reminders of event, Guilt/shame (had done soomething outside his ethics ($) to nephew. Told nephew who forgave him and did not get mad at him. He is upset with self.)  Obsessions:  Obsessions: N/A  Compulsions:  Compulsions: N/A  Inattention:  Inattention: Forgetful  Hyperactivity/Impulsivity:  Hyperactivity/Impulsivity: N/A  Oppositional/Defiant Behaviors:  Oppositional/Defiant Behaviors: N/A  Borderline Personality:  Emotional Irregularity: N/A  Other Mood/Personality Symptoms:  Other Mood/Personality Symtpoms: memory problems,   Mental Status Exam Appearance and self-care  Stature:  Stature: Average  Weight:  Weight: Average weight  Clothing:  Clothing: Casual  Grooming:  Grooming: Normal  Cosmetic use:  Cosmetic Use: None  Posture/gait:  Posture/Gait: Slumped  Motor activity:  Motor Activity: Slowed  Sensorium  Attention:  Attention: Confused  Concentration:  Concentration: Anxiety interferes  Orientation:  Orientation: X5  Recall/memory:  Recall/Memory: Defective in short-term, Defective in Remote  Affect and Mood  Affect:  Affect: Depressed  Mood:  Mood: Depressed  Relating  Eye contact:  Eye Contact: None  Facial expression:  Facial Expression: Depressed  Attitude toward examiner:  Attitude Toward Examiner: Cooperative  Thought and Language  Speech flow: Speech Flow: Soft  Thought content:  Thought Content: Appropriate to mood and circumstances  Preoccupation:  Preoccupations: Guilt, Other (Comment) (Financial worries)  Hallucinations:  Hallucinations:  (I sometimes question what I hear)  Organization:     Transport planner of Knowledge:  Fund of Knowledge: Impoverished by:  (Comment) (Forgetful)  Intelligence:  Intelligence: Average  Abstraction:  Abstraction: Normal  Judgement:  Judgement: Poor  Reality Testing:  Reality Testing: Realistic  Insight:  Insight: Poor  Decision Making:   Decision Making: Confused  Social Functioning  Social Maturity:  Social Maturity: Isolates  Social Judgement:  Social Judgement: Normal  Stress  Stressors:  Stressors: Brewing technologist, Work, Psychologist, forensic Ability:  Coping Ability: Exhausted, English as a second language teacher Deficits:     Supports:      Family and Psychosocial History: Family history Marital status: Married Number of Years Married: 93 What types of issues is patient dealing with in the relationship?: wife = Nana , finicial hardship Additional relationship information: "She is good to me." Are you sexually active?: No What is your sexual orientation?: heterosexual Does patient have children?: Yes How many children?: 2 How is patient's relationship with their children?: we have a good relationship. I just don't want to be around them. They don't do anything wrong. I just don't want to be around anybody  Childhood History:  Childhood History By whom was/is the patient raised?: Both parents Additional childhood history information: I had a good childhood. My parents were good to me. Description of patient's relationship with caregiver when they were a child: I had a good childhood. My parents were good to me. Patient's description of current relationship with people who raised him/her: Father is deceased ( he could not remember when he died) - Mother is in a nursing home. "I love her and yet I have a hard time even talking to her on the phne. I donn't want to talk with or see anybody." How were you disciplined when you got in trouble as a child/adolescent?: spankings with a belt or switch Does patient have siblings?: Yes Number of Siblings: 3 Description of patient's current relationship with siblings: We have a good relationship Did patient suffer any verbal/emotional/physical/sexual abuse as a child?: No Did patient suffer from severe childhood neglect?: No Has patient ever been sexually abused/assaulted/raped as an adolescent or  adult?: No Was the patient ever a victim of a crime or a disaster?: No Witnessed domestic violence?: No Has patient been effected by domestic violence as an adult?: No  CCA Part Two B  Employment/Work Situation: Employment / Work Copywriter, advertising Employment situation: Unemployed Patient's job has been impacted by current illness: Yes Describe how patient's job has been impacted: About a year ago - 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 I don't think I can do it anymore. I don't think my body or mind will allow me to work.  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 Has patient ever been in the [employer]?: No Has patient ever served in Recruitment consultant?: No Did You Receive Any Psychiatric Treatment/Services While in Passenger transport manager?: No Are There Guns or Other Weapons in [place]?: Yes Types of Guns/Weapons: guns in gun safe Are These Psychologist, educational?: Yes  Education: Education Last Grade Completed: 12 Did Teacher, adult education From [employer]?: Yes Did Physicist, medical?: Yes What Type of College Degree Do you Have?: BSC - in social sciences, but did not end up using my degree - I went to work doing something else Did Heritage manager?: No Did You Have An Individualized Education Program (IIEP): No Did You Have Any Difficulty At School?: No  Religion: Religion/Spirituality Are You A Religious Person?:  ("I wish I was because I would pray for a miracle.")  Leisure/Recreation: Leisure /  Recreation Leisure and Hobbies: "Nothing right now."  Exercise/Diet: Exercise/Diet Do You Exercise?: No Have You Gained or Lost A Significant Amount of Weight in the Past Six Months?: Yes-Lost Number of Pounds Gained:  (doesn't know how much) Do You Follow a Special Diet?: No Do You Have Any Trouble Sleeping?: Yes Explanation of Sleeping Difficulties: Insomnia - "I wait all day to be able to go to bed to sleep,  knowing full well I won't sleep. I feel dred and anxiety. My CPAP doesn't seal right and I have to be just so or it leaks."  CCA Part Two C  Alcohol/Drug Use: Alcohol / Drug Use History of alcohol / drug use?: No history of alcohol / drug abuse                      CCA Part Three  ASAM's:  Six Dimensions of Multidimensional Assessment  Dimension 1:  Acute Intoxication and/or Withdrawal Potential:     Dimension 2:  Biomedical Conditions and Complications:     Dimension 3:  Emotional, Behavioral, or Cognitive Conditions and Complications:     Dimension 4:  Readiness to Change:     Dimension 5:  Relapse, Continued use, or Continued Problem Potential:     Dimension 6:  Recovery/Living Environment:      Substance use Disorder (SUD)    Social Function:  Social Functioning Social Maturity: Isolates Social Judgement: Normal  Stress:  Stress Stressors: Grief/losses, Work, Barista Ability: Exhausted, Overwhelmed Patient Takes Medications The Way The Doctor Instructed?: Yes Priority Risk: Moderate Risk  Risk Assessment- Self-Harm Potential: Risk Assessment For Self-Harm Potential Thoughts of Self-Harm: No current thoughts Method: No plan Availability of Means: Have close by Additional Comments for Self-Harm Potential: Passing thoughts he would be better off dead because he doesn't think anything will get better with the finances. No current suicidal or homicidal ideation. "I don't want to kill myself."  Risk Assessment -Dangerous to Others Potential: Risk Assessment For Dangerous to Others Potential Method: No Plan Availability of Means: No access or NA Intent: Vague intent or NA Notification Required: No need or identified person  DSM5 Diagnoses: Patient Active Problem List   Diagnosis Date Noted  . OCD (obsessive compulsive disorder) 12/31/2015  . Memory changes 12/27/2015  . Depression 12/27/2015  . Generalized anxiety disorder 11/12/2015  . MDD (major  depressive disorder), recurrent severe, without psychosis ([place]) 11/02/2015  . Panic attacks 11/02/2015  . Acute confusional state 10/31/2015  . Arthritis of knee, right 02/23/2014    Class: Chronic  . Arthritis of right knee 02/23/2014    Patient Centered Plan: Patient is on the following Treatment Plan(s):  Treatment plan to be formulated at next session. Individual therapy 1x a week, sessions to become less frequent as symptoms improve, follow safety plan as needed  Recommendations for Services/Supports/Treatments: Recommendations for Services/Supports/Treatments Recommendations For Services/Supports/Treatments: Individual Therapy, Medication Management  Treatment Plan Summary:    Referrals to Alternative Service(s): Referred to Alternative Service(s):   Place:   Date:   Time:    Referred to Alternative Service(s):   Place:   Date:   Time:    Referred to Alternative Service(s):   Place:   Date:   Time:    Referred to Alternative Service(s):   Place:   Date:   Time:     Chrisie Jankovich A

## 2016-01-14 DIAGNOSIS — G4733 Obstructive sleep apnea (adult) (pediatric): Secondary | ICD-10-CM | POA: Diagnosis not present

## 2016-01-15 ENCOUNTER — Ambulatory Visit (INDEPENDENT_AMBULATORY_CARE_PROVIDER_SITE_OTHER): Payer: PPO | Admitting: Psychiatry

## 2016-01-15 ENCOUNTER — Encounter (HOSPITAL_COMMUNITY): Payer: Self-pay | Admitting: Psychiatry

## 2016-01-15 VITALS — BP 138/90 | HR 88 | Ht 69.0 in | Wt 254.2 lb

## 2016-01-15 DIAGNOSIS — F429 Obsessive-compulsive disorder, unspecified: Secondary | ICD-10-CM

## 2016-01-15 DIAGNOSIS — F411 Generalized anxiety disorder: Secondary | ICD-10-CM

## 2016-01-15 MED ORDER — BUPROPION HCL ER (SR) 150 MG PO TB12: 150.0000 mg | ORAL_TABLET | Freq: Two times a day (BID) | ORAL | Status: DC

## 2016-01-15 MED ORDER — CLONAZEPAM 0.5 MG PO TABS: 0.5000 mg | ORAL_TABLET | Freq: Every day | ORAL | Status: DC

## 2016-01-15 MED ORDER — TRAZODONE HCL 100 MG PO TABS: 100.0000 mg | ORAL_TABLET | Freq: Every day | ORAL | Status: DC

## 2016-01-15 NOTE — Progress Notes (Signed)
[facility] MD Progress Note.  Patient Identification: Brad Miller MRN:  [id number]   Chief Complaint:   depression and  rumination Visit Diagnosis:     ICD-9-CM ICD-10-CM   1. Generalized anxiety disorder 300.02 F41.1   2. OCD (obsessive compulsive disorder) 300.3 F42.9    Diagnosis:   Patient Active Problem List   Diagnosis Date Noted  . OCD (obsessive compulsive disorder) [F42.9] 12/31/2015    Priority: High  . Generalized anxiety disorder [F41.1] 11/12/2015    Priority: High  . Memory changes [R41.3] 12/27/2015  . Depression [F32.9] 12/27/2015  . MDD (major depressive disorder), recurrent severe, without psychosis ([place]) [F33.2] 11/02/2015  . Panic attacks [F41.0] 11/02/2015  . Acute confusional state [F05] 10/31/2015  . 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 Arthritis of right knee [M12.9] 02/23/2014   History of Present Illness:      Patient seen today for medication follow-up states, states he still does not feel good reports that he is anxious and his concentration is poor. He spent Christmas with his children and states that despite dreading it  was able to get through it. He got himself anAudio book  but has been unable to focus Patient continues to feel anxious hopeless and helpless although his affect appears to be improved. He is more spontaneous and is actually smiling.  States his sleep is poor because of the CPAP machine, appetite has been good mood is still dysphoric and depressed denies suicidal or homicidal ideation no hallucinations or delusions. His tolerating his Risperdal well and it appears to be helping him. He is not ruminating constantly. Patient has started therapy with Brad Miller and likes it.  .                         Notes from initial visit on 40 /89/30 65 year old white married male referred from [facility] H inpatient unit where he was brought 10 after getting lost and also had acute onset of loss of speech was confused rocking back and  forth. He was admitted to the adult inpatient unit. An MRI was done which was found to be normal.  Patient has multiple stressors that are affecting his life, #1 is financial stressors. #2 he was cheating his boss was also his nephew. #3 he had an affair and then told his wife. #4 his mother has been admitted to the group home. Patient has been feeling overwhelmed with all of this. Admits to feeling depressed but mostly feels overwhelmed and anxious and states that his memory is poor and his concentration is bad.  Patient lives in [place] with his wife he has 2 children from his first wife. Patient has worried about the future his worried about the finances. Short-term memory is poor, tends to get confused at times. States that he cannot read and cannot get into the book. Also has no interest in watching TV as he does not like the new shows. Discussed the target market for the new shows are the younger generation. Patient also states that the money that he got from the nephew he added [place] because he wanted to. Patient also states that sometimes his coordination is not the greatest but he has not fallen.  Patient drives when he has to, his wife drives him around. 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 Patient was continued on his Wellbutrin SR 150 twice a day, Zoloft 25 mg  was added. Trazodone 100 mg and Klonopin 0.5 twice a day when necessary.  Since discharge he states that his sleep is good, appetite is good mood is anxious he tends to ruminate about cheating on his wife and putting his mother in the group home and also cheating on his nephew. Patient blames himself. He denies suicidal ideation denies homicidal ideation at times feels hopeless and helpless denies hallucinations or delusions.  At this point with his permission his wife joined [place] to provide further information.  Substance Abuse History in the last 12 months:  Yes.   patient's drinks 1-2 glasses  of wine occasionally he is a social drinker.  Consequences of Substance Abuse: NA   Past psychiatric history.---------- Patient was hospitalized 20 years ago at [facility] psychiatric unit because of leaving his first wife to marry his current wife and felt guilty about it. No outpatient follow-up. Hospitalized at [facility] H adult unit at [facility] from 11/01/2015 01/31/1766   Past Medical History: Reviewed as listed below Past Medical History  Diagnosis Date  . Hyperlipidemia     TAKES LOVASTATIN NIGHTLY  . Depression     TAKES WELLBUTRIN DAILY  . Anxiety     TAKES XANAX DAILY AS NEEDED  . Hypertension     TAKES CARDURA DAILY  . Sleep apnea     USES CPAP  . Headache(784.0)     OCCASIONALLY  . Arthritis   . Joint pain   . Urinary frequency   . Urinary urgency   . Enlarged prostate   . Diabetes mellitus without complication (HCC)     BORDERLINE  . Cataracts, bilateral   . Rash     AROUND NOSE AND STATES D/T NOT USING LOTION WHILE SHAVING    Past Surgical History  Procedure Laterality Date  . 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 Total knee arthroplasty Right 02/23/2014    Procedure: TOTAL KNEE ARTHROPLASTY;  Surgeon: Kerin Salen, MD;  Location: [place];  Service: Orthopedics;  Laterality: Right;  . Knee arthroscopy Left 02/23/2014    Procedure: LEFT ARTHROSCOPY KNEE;  Surgeon: Kerin Salen, MD;  Location: [place];  Service: Orthopedics;  Laterality: Left;  . Cataract extraction Bilateral    Family History: Reviewed as listed below Family History  Problem Relation Age of Onset  . Hypertension Mother   . Heart attack Mother   . Osteoporosis Mother   . CVA Mother   . Coronary artery disease Mother   . Hypertension Father   . Emphysema Father   . Dementia Father     father tried to commit suicide when he had dementia  . Coronary artery disease Brother   . Diabetes Brother    Social History:  Patient lives with his wife in [place]   . Marital Status: Married    Spouse Name: N/A  . Number of Children: 2  . Years of Education: N/A   Occupational History  . Not Working    Social History Main Topics  . Smoking status: Never Smoker   . Smokeless tobacco: Never Used  . Alcohol Use: 0.6 oz/week    1 Cans of beer per week     Comment: Rare  . Drug Use: No  . Sexual Activity: Not Currently   Other Topics Concern  . Not on file   Social History Narrative     Musculoskeletal: Strength & Muscle Tone: within normal limits Gait & Station: normal Patient  leans: N/A  Psychiatric Specialty Exam: HPI  Review of Systems  Constitutional: Negative.   HENT: Negative.   Eyes: Negative.   Respiratory: Negative.   Cardiovascular: Negative.   Gastrointestinal: Negative.   Genitourinary: Negative.   Musculoskeletal: Negative.   Skin: Negative.   Neurological: Negative.   Endo/Heme/Allergies: Negative.   Psychiatric/Behavioral: Positive for depression. Negative for memory loss. The patient is nervous/anxious.     Blood pressure 138/90, pulse 88, height 5\' 9"  (1.753 m), weight 254 lb 3.2 oz (115.304 kg).Body mass index is 37.52 kg/(m^2).  General Appearance: Casual  Eye Contact:  Good  Speech:  Clear and Coherent and Normal Rate  Volume:  Normal  Mood:  Depressed and anxious   Affect:  Constricted   Thought Process:  Goal Directed, Linear and Logical  Orientation:  Full (Time, Place, and Person)  Thought Content:  Rumination   Suicidal Thoughts:  No  Homicidal Thoughts:  No  Memory:  Immediate is good, recent and remote memory is good   Judgement:  Good   Insight:  Fair  Psychomotor Activity:  Normal  Concentration:  Fair  Recall:  Poor  2/3  Fund of Knowledge:Fair  Language: Good  Akathisia:  No  Handed:  Right  AIMS (if indicated):  0  Assets:  Communication Skills Desire for Improvement Housing Resilience Social Support Transportation  ADL's:  Intact  Cognition: WNL  Sleep:     Is the  patient at risk to self?  No. Has the patient been a risk to self in the past 6 months?  Yes.   Has the patient been a risk to self within the distant past?  No. Is the patient a risk to others?  No. Has the patient been a risk to others in the past 6 months?  No. Has the patient been a risk to others within the distant past?  No.  Allergies:   reviewed as listed below  Allergies  Allergen Reactions  . Ciprofloxacin     GI side effects   . Hydrocodone-Acetaminophen     Itch/constipation  . Penicillins     Childhood allergy. Knot on arm Has patient had a PCN reaction causing immediate rash, facial/tongue/throat swelling, SOB or lightheadedness with hypotension: No Has patient had a PCN reaction causing severe rash involving mucus membranes or skin necrosis: No Has patient had a PCN reaction that required hospitalization No Has patient had a PCN reaction occurring within the last 10 years: No If all of the above answers are "NO", then may pro  . Zocor [Simvastatin]     Back aches    Current Medications:reviewed as listed below  Current Outpatient Prescriptions  Medication Sig Dispense Refill  . buPROPion (WELLBUTRIN SR) 150 MG 12 hr tablet Take 1 tablet (150 mg total) by mouth 2 (two) times daily. 60 tablet 2  . Butalbital-APAP-Caffeine 50-325-40 MG/15ML SOLN Take by mouth 4 (four) times daily as needed. Patient takes a capsule.    . clonazePAM (KLONOPIN) 0.5 MG tablet Take 1 tablet (0.5 mg total) by mouth at bedtime. 30 tablet 2  . cycloSPORINE (RESTASIS) 0.05 % ophthalmic emulsion 1 drop 2 (two) times daily.    Marland Kitchen doxazosin (CARDURA) 8 MG tablet Take 0.5 tablets (4 mg total) by mouth daily.    Marland Kitchen ibuprofen (ADVIL,MOTRIN) 800 MG tablet Take 800 mg by mouth as needed.     . Omega-3 Fatty Acids (FISH OIL PO) Take by mouth 1 day or 1 dose. Reported on 01/06/2016    .  POTASSIUM CHLORIDE PO Take by mouth.    . pyridOXINE (VITAMIN B-6) 50 MG tablet Take 50 mg by mouth 3 (three) times a  week.    . risperiDONE (RISPERDAL) 0.25 MG tablet Take 1 tablet (0.25 mg total) by mouth 2 (two) times daily. 60 tablet 2  . testosterone cypionate (DEPOTESTOSTERONE CYPIONATE) 200 MG/ML injection Inject 1.5 mLs (300 mg total) into the muscle every 28 (twenty-eight) days. To be managed outpatient (Patient taking differently: Inject into the muscle every 28 (twenty-eight) days. To be managed outpatient) 10 mL 0  . traZODone (DESYREL) 100 MG tablet Take 1 tablet (100 mg total) by mouth at bedtime. 30 tablet 2  . TURMERIC PO Take by mouth 1 day or 1 dose.     No current facility-administered medications for this visit.    Previous Psychotropic Medications: Yes     Medical Decision Making:  New problem, with additional work up planned, Review of Psycho-Social Stressors (1), Review or order clinical lab tests (1), Review and summation of old records (2), Review of Last Therapy Session (1) and Review of Medication Regimen & Side Effects (2)  Treatment Plan Summary: Medication management  treatment plan. #1 major depression recurrent severe Patient will continue Wellbutrin SR 150 mg twice a day.   #2 insomnia Patient will continue trazodone 100 mg by mouth daily at bedtime.  #3 generalized anxiety. And obsessions  Continue Risperdal 0.25 mg by mouth twice a day ,  continue Klonopin 0.5 mg by mouth daily at bedtime. . #4 therapy. Patient will [facility] to see [facility] for therapy.  #5 he'll return to see me in the clinic in  4 weeks call sooner if necessary. This visit was of 35 minutes. More than 50% of the time was spent  In reassuring the patient that he will get better and also discussing medications and diagnosis.. Discussed that he needed to forgive himself about all the stressors that have affected him recently. Coping skills and action alternatives to depression and anxiety was discussed. Interpersonal and supportive therapy was provided    Erin Sons 1/25/20173:48 PM

## 2016-01-23 ENCOUNTER — Ambulatory Visit (INDEPENDENT_AMBULATORY_CARE_PROVIDER_SITE_OTHER): Payer: PPO | Admitting: Clinical

## 2016-01-23 DIAGNOSIS — F411 Generalized anxiety disorder: Secondary | ICD-10-CM | POA: Diagnosis not present

## 2016-01-23 DIAGNOSIS — F332 Major depressive disorder, recurrent severe without psychotic features: Secondary | ICD-10-CM
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
## 2016-01-29 ENCOUNTER — Encounter (HOSPITAL_COMMUNITY): Payer: Self-pay | Admitting: Clinical

## 2016-02-13 ENCOUNTER — Ambulatory Visit (INDEPENDENT_AMBULATORY_CARE_PROVIDER_SITE_OTHER): Payer: PPO | Admitting: Clinical

## 2016-02-13 ENCOUNTER — Encounter (HOSPITAL_COMMUNITY): Payer: Self-pay | Admitting: Clinical

## 2016-02-13 DIAGNOSIS — F332 Major depressive disorder, recurrent severe without psychotic features: Secondary | ICD-10-CM | POA: Diagnosis not present

## 2016-02-13 DIAGNOSIS — F411 Generalized anxiety disorder: Secondary | ICD-10-CM

## 2016-02-13 NOTE — Progress Notes (Signed)
   THERAPIST PROGRESS NOTE  Session Time: 9:02 - 9:58  Participation Level: Active  Behavioral Response: CasualAlertDepressed  Type of Therapy: Individual Therapy  Treatment Goals addressed: Improve psychiatric symptoms, elevate mood (increased social interaction), improve unhelpful thought patterns, more positive and optimistic outlook  Interventions: Motivational Interviewing and Other: Grounding and Mindfulness techniques, psychoeducation  Summary: Brad Miller is a 65 y.o. male who presents with Severe recurrent major depressive disorder with psychotic features, and Generalized Anxiety Disorder  Suicidal/Homicidal: No without intent/plan  Therapist Response:  [name] met with clinician for individual therapy. [name] discussed his psychiatric symptoms and current life events. [name] shared that he continues to be very depressed. He stated that he had forgot how to what grounding and mindfulness techniques where and how to practice the techniques. Clinician explained and wrote down the instructions for two techniques. Client and clinician practiced the techniques together. [name] shared that he thought they would be helpful for interrupting his negative thoughts and emotions. [name]  Shared that he was focusing a lot of his attention on his financial worries. Clinician suggested he contact [employer] (number was provided)  to see if there is some assistance available. He shared that his other focus was on his (lack of) relationship with his nephew. He shared that he was still fearful to contact him. Clinician introduced a 7 panel thought record sheet. Client and clinician filled it out together. He identified his emotions, his negative automatic thoughts, the evidence for and against the thoughts, He was then able to formulate healthier alternative thoughts. Even after [name] shared that he wasn't sure if he would make contact but it weighs on his thoughts and emotions daily.  [name]  Shared that the other concern that weighs on him is weather or not he will be able to work in the future. He shared about how this plays into his negative thoughts about his relationship with his nephew. [name] agreed to practice his grounding and mindfulness techniques until next session. Clinician also provided him with a packet on depression.   Plan: Return again in 1-2 weeks.  Diagnosis:     Axis I: Severe recurrent major depressive disorder with psychotic features, and Generalized Anxiety Disorder   [place] Kretz A, LCSW 02/13/2016

## 2016-02-17 ENCOUNTER — Encounter (HOSPITAL_COMMUNITY): Payer: Self-pay | Admitting: Psychiatry

## 2016-02-17 ENCOUNTER — Ambulatory Visit (INDEPENDENT_AMBULATORY_CARE_PROVIDER_SITE_OTHER): Payer: PPO | Admitting: Psychiatry

## 2016-02-17 VITALS — BP 132/88 | HR 101 | Ht 69.0 in | Wt 252.2 lb

## 2016-02-17 DIAGNOSIS — F411 Generalized anxiety disorder: Secondary | ICD-10-CM

## 2016-02-17 DIAGNOSIS — F429 Obsessive-compulsive disorder, unspecified: Secondary | ICD-10-CM | POA: Diagnosis not present

## 2016-02-17 DIAGNOSIS — F332 Major depressive disorder, recurrent severe without psychotic features: Secondary | ICD-10-CM | POA: Diagnosis not present

## 2016-02-17 MED ORDER — RISPERIDONE 0.25 MG PO TABS: 0.2500 mg | ORAL_TABLET | Freq: Two times a day (BID) | ORAL | Status: DC

## 2016-02-17 MED ORDER — TRAZODONE HCL 100 MG PO TABS: 100.0000 mg | ORAL_TABLET | Freq: Every day | ORAL | Status: DC

## 2016-02-17 MED ORDER — BUPROPION HCL ER (SR) 150 MG PO TB12: 150.0000 mg | ORAL_TABLET | Freq: Two times a day (BID) | ORAL | Status: DC

## 2016-02-17 MED ORDER — CLONAZEPAM 0.5 MG PO TABS: 0.5000 mg | ORAL_TABLET | Freq: Every day | ORAL | Status: DC

## 2016-02-17 NOTE — Progress Notes (Signed)
[facility] MD Progress Note.  Patient Identification: Brad Miller MRN:  [id number]  Subjective-  I don't feel any different  Visit Diagnosis:     ICD-9-CM ICD-10-CM   1. Generalized anxiety disorder 300.02 F41.1   2. OCD (obsessive compulsive disorder) 300.3 F42.9   3. MDD (major depressive disorder), recurrent severe, without psychosis ([place]) 296.33 F33.2    Diagnosis:   Patient Active Problem List   Diagnosis Date Noted  . OCD (obsessive compulsive disorder) [F42.9] 12/31/2015    Priority: High  . Generalized anxiety disorder [F41.1] 11/12/2015    Priority: High  . Memory changes [R41.3] 12/27/2015  . Depression [F32.9] 12/27/2015  . MDD (major depressive disorder), recurrent severe, without psychosis ([place]) [F33.2] 11/02/2015  . Panic attacks [F41.0] 11/02/2015  . Acute confusional state [F05] 10/31/2015  . 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 Arthritis of right knee [M12.9] 02/23/2014   History of Present Illness:      Patient seen today for medication follow-up states, he notes no change, continues to worry about finances and is anxious but mood appears to be better when this was pointed out patient states that he does not feel any better. Patient is started therapy with Tharon Aquas and likes her states that she has been giving him assignments and word searches in making sentences with the words sometimes he gets frustrated during the but is able to do it.  States sleep tends to fluctuate some days is good some days is some good. Does not feel rested appetite is good mood continues to be anxious, denies feeling hopeless and helpless concentration appears to be improving denies suicidal or homicidal ideation no hallucinations or delusions.  Patient appears to be coping better, tolerating his medications well.    .                         Notes from initial visit on 62 /71/71 65 year old white married male referred from [facility] H inpatient unit where he was  brought 10 after getting lost and also had acute onset of loss of speech was confused rocking back and forth. He was admitted to the adult inpatient unit. An MRI was done which was found to be normal.  Patient has multiple stressors that are affecting his life, #1 is financial stressors. #2 he was cheating his boss was also his nephew. #3 he had an affair and then told his wife. #4 his mother has been admitted to the group home. Patient has been feeling overwhelmed with all of this. Admits to feeling depressed but mostly feels overwhelmed and anxious and states that his memory is poor and his concentration is bad.  Patient lives in [place] with his wife he has 2 children from his first wife. Patient has worried about the future his worried about the finances. Short-term memory is poor, tends to get confused at times. States that he cannot read and cannot get into the book. Also has no interest in watching TV as he does not like the new shows. Discussed the target market for the new shows are the younger generation. Patient also states that the money that he got from the nephew he added [place] because he wanted to. Patient also states that sometimes his coordination is not the greatest but he has not fallen.  Patient drives when he has to, his wife drives him around. 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 Patient was continued on his Wellbutrin SR 150 twice a day, Zoloft 25 mg was added. Trazodone 100 mg and Klonopin 0.5 twice a day when necessary.  Since discharge he states that his sleep is good, appetite is good mood is anxious he tends to ruminate about cheating on his wife and putting his mother in the group home and also cheating on his nephew. Patient blames himself. He denies suicidal ideation denies homicidal ideation at times feels hopeless and helpless denies hallucinations or delusions.  At this point with his permission his wife joined [place] to provide  further information.  Substance Abuse History in the last 12 months:  Yes.   patient's drinks 1-2 glasses of wine occasionally he is a social drinker.  Consequences of Substance Abuse: NA   Past psychiatric history.---------- Patient was hospitalized 20 years ago at [facility] psychiatric unit because of leaving his first wife to marry his current wife and felt guilty about it. No outpatient follow-up. Hospitalized at [facility] adult unit at [facility] from 11/01/2015 01/31/1659   Past Medical History: Reviewed as listed below Past Medical History  Diagnosis Date  . Hyperlipidemia     TAKES LOVASTATIN NIGHTLY  . Depression     TAKES WELLBUTRIN DAILY  . Anxiety     TAKES XANAX DAILY AS NEEDED  . Hypertension     TAKES CARDURA DAILY  . Sleep apnea     USES CPAP  . Headache(784.0)     OCCASIONALLY  . Arthritis   . Joint pain   . Urinary frequency   . Urinary urgency   . Enlarged prostate   . Diabetes mellitus without complication (HCC)     BORDERLINE  . Cataracts, bilateral   . Rash     AROUND NOSE AND STATES D/T NOT USING LOTION WHILE SHAVING    Past Surgical History  Procedure Laterality Date  . 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 Total knee arthroplasty Right 02/23/2014    Procedure: TOTAL KNEE ARTHROPLASTY;  Surgeon: Kerin Salen, MD;  Location: [place];  Service: Orthopedics;  Laterality: Right;  . Knee arthroscopy Left 02/23/2014    Procedure: LEFT ARTHROSCOPY KNEE;  Surgeon: Kerin Salen, MD;  Location: [place];  Service: Orthopedics;  Laterality: Left;  . Cataract extraction Bilateral    Family History: Reviewed as listed below Family History  Problem Relation Age of Onset  . Hypertension Mother   . Heart attack Mother   . Osteoporosis Mother   . CVA Mother   . Coronary artery disease Mother   . Hypertension Father   . Emphysema Father   . Dementia Father     father tried to commit suicide when he had dementia  . Coronary artery disease Brother   .  Diabetes Brother    Social History:  Patient lives with his wife in [place]  . Marital Status: Married    Spouse Name: N/A  . Number of Children: 2  . Years of Education: N/A   Occupational History  . Not Working    Social History Main Topics  . Smoking status: Never Smoker   . Smokeless tobacco: Never Used  . Alcohol Use: 0.6 oz/week    1 Cans of beer per week     Comment: Rare  . Drug Use: No  . Sexual Activity: Not Currently   Other Topics Concern  . Not on file   Social History Narrative  Musculoskeletal: Strength & Muscle Tone: within normal limits Gait & Station: normal Patient leans: N/A  Psychiatric Specialty Exam: HPI  Review of Systems  Constitutional: Negative.   HENT: Negative.   Eyes: Negative.   Respiratory: Negative.   Cardiovascular: Negative.   Gastrointestinal: Negative.   Genitourinary: Negative.   Musculoskeletal: Negative.   Skin: Negative.   Neurological: Negative.   Endo/Heme/Allergies: Negative.   Psychiatric/Behavioral: Positive for depression. Negative for memory loss. The patient is nervous/anxious.    being frankly and she is   Blood pressure 132/88, pulse 101, height 5\' 9"  (1.753 m), weight 252 lb 3.2 oz (114.397 kg).Body mass index is 37.23 kg/(m^2).  General Appearance: Casual  Eye Contact:  Good  Speech:  Clear and Coherent and Normal Rate  Volume:  Normal  Mood:   anxious   Affect:  More appropriate   Thought Process:  Goal Directed, Linear and Logical  Orientation:  Full (Time, Place, and Person)  Thought Content:  Rumination   Suicidal Thoughts:  No  Homicidal Thoughts:  No  Memory:  Immediate is good, recent and remote memory is good   Judgement:  Good   Insight:  Fair  Psychomotor Activity:  Normal  Concentration:  Fair  Recall:  Good   Fund of Knowledge:Fair  Language: Good  Akathisia:  No  Handed:  Right  AIMS (if indicated):  0  Assets:  Communication Skills Desire  for Improvement Housing Resilience Social Support Transportation  ADL's:  Intact  Cognition: WNL  Sleep:     Is the patient at risk to self?  No. Has the patient been a risk to self in the past 6 months?  Yes.   Has the patient been a risk to self within the distant past?  No. Is the patient a risk to others?  No. Has the patient been a risk to others in the past 6 months?  No. Has the patient been a risk to others within the distant past?  No.  Allergies:   reviewed as listed below  Allergies  Allergen Reactions  . Ciprofloxacin     GI side effects   . Hydrocodone-Acetaminophen     Itch/constipation  . Penicillins     Childhood allergy. Knot on arm Has patient had a PCN reaction causing immediate rash, facial/tongue/throat swelling, SOB or lightheadedness with hypotension: No Has patient had a PCN reaction causing severe rash involving mucus membranes or skin necrosis: No Has patient had a PCN reaction that required hospitalization No Has patient had a PCN reaction occurring within the last 10 years: No If all of the above answers are "NO", then may pro  . Zocor [Simvastatin]     Back aches    Current Medications:reviewed as listed below  Current Outpatient Prescriptions  Medication Sig Dispense Refill  . buPROPion (WELLBUTRIN SR) 150 MG 12 hr tablet Take 1 tablet (150 mg total) by mouth 2 (two) times daily. 60 tablet 2  . Butalbital-APAP-Caffeine 50-325-40 MG/15ML SOLN Take by mouth 4 (four) times daily as needed. Patient takes a capsule.    . clonazePAM (KLONOPIN) 0.5 MG tablet Take 1 tablet (0.5 mg total) by mouth at bedtime. 30 tablet 2  . cycloSPORINE (RESTASIS) 0.05 % ophthalmic emulsion 1 drop 2 (two) times daily.    Marland Kitchen doxazosin (CARDURA) 8 MG tablet Take 0.5 tablets (4 mg total) by mouth daily.    Marland Kitchen ibuprofen (ADVIL,MOTRIN) 800 MG tablet Take 800 mg by mouth as needed.     . Omega-3  Fatty Acids (FISH OIL PO) Take by mouth 1 day or 1 dose. Reported on 01/06/2016     . POTASSIUM CHLORIDE PO Take by mouth.    . pyridOXINE (VITAMIN B-6) 50 MG tablet Take 50 mg by mouth 3 (three) times a week.    . risperiDONE (RISPERDAL) 0.25 MG tablet Take 1 tablet (0.25 mg total) by mouth 2 (two) times daily. 60 tablet 2  . testosterone cypionate (DEPOTESTOSTERONE CYPIONATE) 200 MG/ML injection Inject 1.5 mLs (300 mg total) into the muscle every 28 (twenty-eight) days. To be managed outpatient (Patient taking differently: Inject into the muscle every 28 (twenty-eight) days. To be managed outpatient) 10 mL 0  . traZODone (DESYREL) 100 MG tablet Take 1 tablet (100 mg total) by mouth at bedtime. 30 tablet 2  . TURMERIC PO Take by mouth 1 day or 1 dose.     No current facility-administered medications for this visit.    Previous Psychotropic Medications: Yes     Medical Decision Making:  New problem, with additional work up planned, Review of Psycho-Social Stressors (1), Review or order clinical lab tests (1), Review and summation of old records (2), Review of Last Therapy Session (1) and Review of Medication Regimen & Side Effects (2)  Treatment Plan Summary: Medication management  treatment plan. #1 major depression recurrent severe Patient will continue Wellbutrin SR 150 mg twice a day.   #2 insomnia Patient will continue trazodone 100 mg by mouth daily at bedtime.  #3 generalized anxiety. And obsessions  Continue Risperdal 0.25 mg by mouth twice a day ,  continue Klonopin 0.5 mg by mouth daily at bedtime. . #4 therapy. Patient will continue to see [facility] for [facility].  #5 he'll return to see me in the clinic in  8weeks call sooner if necessary. This visit was of 20 minutes. More than 50% of the time was spent  In reassuring the patient that he will get better and also discussing medications and diagnosis.. Discussed that he needed to forgive himself about all the stressors that have affected him recently. Coping skills and action alternatives to depression and  anxiety was discussed. Interpersonal and supportive therapy was provided    Erin Sons 2/27/20173:56 PM

## 2016-02-18 DIAGNOSIS — L089 Local infection of the skin and subcutaneous tissue, unspecified: Secondary | ICD-10-CM | POA: Diagnosis not present

## 2016-02-28 DIAGNOSIS — M129 Arthropathy, unspecified: Secondary | ICD-10-CM | POA: Diagnosis not present

## 2016-02-28 DIAGNOSIS — G4733 Obstructive sleep apnea (adult) (pediatric): Secondary | ICD-10-CM | POA: Diagnosis not present

## 2016-03-12 ENCOUNTER — Encounter (HOSPITAL_COMMUNITY): Payer: Self-pay | Admitting: Clinical

## 2016-03-12 ENCOUNTER — Ambulatory Visit (INDEPENDENT_AMBULATORY_CARE_PROVIDER_SITE_OTHER): Payer: PPO | Admitting: Clinical

## 2016-03-12 DIAGNOSIS — F411 Generalized anxiety disorder: Secondary | ICD-10-CM

## 2016-03-12 NOTE — Progress Notes (Signed)
   THERAPIST PROGRESS NOTE  Session Time: 2:33 - 3:30  Participation Level: Active  Behavioral Response: CasualAlertDepressed  Type of Therapy: Individual Therapy  Treatment Goals addressed: Improve psychiatric symptoms, elevate mood , improve unhelpful thought patterns, more positive and optimistic outlook  Interventions: cbt  Motivational Interviewing and Other: Grounding and Mindfulness techniques,   Summary: Brad Miller is a 65 y.o. male who presents with Severe recurrent major depressive disorder with psychotic features, and Generalized Anxiety Disorder  Suicidal/Homicidal: No without intent/plan  Therapist Response:  Brad Miller met with clinician for individual therapy. Brad Miller discussed his psychiatric symptoms and current life events. Brad Miller shared that he continues to be very depressed and anxious. Brad Miller shared that he had not taken any action towards improving the issues discussed in last session. He shared that he feels paralyzed from doing anything. He shared his belief that he does not feel that any action he takes will improve his situation. Client and clinician discussed this belief. Client and clinician discussed the evidence for an against the thought. Brad Miller and clinician discussed the the pros and cons of doing some of the things (such as identified in last session) versus not doing anything. Brad Miller had the insight that not doing anything contributed to his feelings of hopelessness as well as his desire to isolate. He shared that doing something would likely increase his anxiety. Client and clinician discussed the likely outcome if he did nothing. Brad Miller had the insight that this would be much more stressful. Brad Miller shared about what he had done to cause the divide between him and his nephew. He shared his negative automatic thoughts about himself about his behaviors. Client and clinician discussed healthier alternative ways to view his interactions with his nephew. He had done something wrong  but he also owned his mistake and took the steps to make it right.   Brad Miller shared that he did not complete his packet on depression. He shared he had not practiced his grounding or mindfulness techniques. He shared that he was completely "frozen" client and clinician reviewed a technique and he agreed to try. Clinician explained how the technique could used before tackling an anxiety provoking activity such as sharing with his wife the exact nature of their financial difficultly.  Plan: Return again in 1-2 weeks.  Diagnosis:     Axis I: Severe recurrent major depressive disorder with psychotic features, and Generalized Anxiety Disorder   Myalynn Lingle A, LCSW 03/12/2016

## 2016-03-27 DIAGNOSIS — H16223 Keratoconjunctivitis sicca, not specified as Sjogren's, bilateral: Secondary | ICD-10-CM | POA: Diagnosis not present

## 2016-04-20 ENCOUNTER — Encounter (HOSPITAL_COMMUNITY): Payer: Self-pay | Admitting: Psychiatry

## 2016-04-20 ENCOUNTER — Ambulatory Visit (INDEPENDENT_AMBULATORY_CARE_PROVIDER_SITE_OTHER): Payer: PPO | Admitting: Psychiatry

## 2016-04-20 VITALS — BP 132/80 | HR 75 | Ht 69.0 in | Wt 246.8 lb

## 2016-04-20 DIAGNOSIS — F411 Generalized anxiety disorder: Secondary | ICD-10-CM

## 2016-04-20 DIAGNOSIS — F429 Obsessive-compulsive disorder, unspecified: Secondary | ICD-10-CM | POA: Diagnosis not present

## 2016-04-20 DIAGNOSIS — F332 Major depressive disorder, recurrent severe without psychotic features: Secondary | ICD-10-CM | POA: Diagnosis not present

## 2016-04-20 MED ORDER — BUPROPION HCL ER (SR) 150 MG PO TB12: 150.0000 mg | ORAL_TABLET | Freq: Two times a day (BID) | ORAL | Status: DC

## 2016-04-20 MED ORDER — CLONAZEPAM 0.5 MG PO TABS: 0.5000 mg | ORAL_TABLET | Freq: Every day | ORAL | Status: DC

## 2016-04-20 MED ORDER — RISPERIDONE 0.25 MG PO TABS: 0.2500 mg | ORAL_TABLET | Freq: Two times a day (BID) | ORAL | Status: DC

## 2016-04-20 MED ORDER — TRAZODONE HCL 100 MG PO TABS: 100.0000 mg | ORAL_TABLET | Freq: Every day | ORAL | Status: DC

## 2016-04-20 NOTE — Progress Notes (Signed)
[facility] MD Progress Note.  Patient Identification: Brad Miller MRN:  [id number]  Subjective-   I am fine    Visit Diagnosis:     ICD-9-CM ICD-10-CM   1. OCD (obsessive compulsive disorder) 300.3 F42.9   2. Generalized anxiety disorder 300.02 F41.1   3. MDD (major depressive disorder), recurrent severe, without psychosis ([place]) 296.33 F33.2    Diagnosis:   Patient Active Problem List   Diagnosis Date Noted  . OCD (obsessive compulsive disorder) [F42.9] 12/31/2015    Priority: High  . Generalized anxiety disorder [F41.1] 11/12/2015    Priority: High  . Memory changes [R41.3] 12/27/2015  . Depression [F32.9] 12/27/2015  . MDD (major depressive disorder), recurrent severe, without psychosis ([place]) [F33.2] 11/02/2015  . Panic attacks [F41.0] 11/02/2015  . Acute confusional state [F05] 10/31/2015  . 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 Arthritis of right knee [M12.9] 02/23/2014   History of Present Illness:      Patient seen today for medication follow-up states,he's been feeling better his mood on a scale of 0-10 with 0 being bad and 10 being good he rates it as an 8. Reports mild anxiety as he thinks about his finances but not like before.  States that therapy with Tharon Aquas has helped him significantly but he cannot afford to see her and so has stopped going to see her. Sleep is good, appetite is good mood has been brighter denies suicidal or homicidal ideation no hallucinations or delusions.   Patient appears to be coping better, tolerating his medications well.    .                         Notes from initial visit on 17 /48/73 65 year old white married male referred from [facility] H inpatient unit where he was brought 10 after getting lost and also had acute onset of loss of speech was confused rocking back and forth. He was admitted to the adult inpatient unit. An MRI was done which was found to be normal.  Patient has multiple stressors that are affecting his  life, #1 is financial stressors. #2 he was cheating his boss was also his nephew. #3 he had an affair and then told his wife. #4 his mother has been admitted to the group home. Patient has been feeling overwhelmed with all of this. Admits to feeling depressed but mostly feels overwhelmed and anxious and states that his memory is poor and his concentration is bad.  Patient lives in [place] with his wife he has 2 children from his first wife. Patient has worried about the future his worried about the finances. Short-term memory is poor, tends to get confused at times. States that he cannot read and cannot get into the book. Also has no interest in watching TV as he does not like the new shows. Discussed the target market for the new shows are the younger generation. Patient also states that the money that he got from the nephew he added [place] because he wanted to. Patient also states that sometimes his coordination is not the greatest but he has not fallen.  Patient drives when he has to, his wife drives him around. 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 Patient was continued on his Wellbutrin SR 150 twice a day, Zoloft 25 mg was added. Trazodone 100 mg and Klonopin 0.5 twice a day when necessary.  Since discharge he states  that his sleep is good, appetite is good mood is anxious he tends to ruminate about cheating on his wife and putting his mother in the group home and also cheating on his nephew. Patient blames himself. He denies suicidal ideation denies homicidal ideation at times feels hopeless and helpless denies hallucinations or delusions.  At this point with his permission his wife joined [place] to provide further information.  Substance Abuse History in the last 12 months:  Yes.   patient's drinks 1-2 glasses of wine occasionally he is a social drinker.  Consequences of Substance Abuse: NA   Past psychiatric history.---------- Patient was hospitalized 20  years ago at [facility] psychiatric unit because of leaving his first wife to marry his current wife and felt guilty about it. No outpatient follow-up. Hospitalized at [facility] H adult unit at [facility] from 11/01/2015 01/31/1659   Past Medical History: Reviewed as listed below Past Medical History  Diagnosis Date  . Hyperlipidemia     TAKES LOVASTATIN NIGHTLY  . Depression     TAKES WELLBUTRIN DAILY  . Anxiety     TAKES XANAX DAILY AS NEEDED  . Hypertension     TAKES CARDURA DAILY  . Sleep apnea     USES CPAP  . Headache(784.0)     OCCASIONALLY  . Arthritis   . Joint pain   . Urinary frequency   . Urinary urgency   . Enlarged prostate   . Diabetes mellitus without complication (HCC)     BORDERLINE  . Cataracts, bilateral   . Rash     AROUND NOSE AND STATES D/T NOT USING LOTION WHILE SHAVING    Past Surgical History  Procedure Laterality Date  . 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 Total knee arthroplasty Right 02/23/2014    Procedure: TOTAL KNEE ARTHROPLASTY;  Surgeon: Kerin Salen, MD;  Location: [place];  Service: Orthopedics;  Laterality: Right;  . Knee arthroscopy Left 02/23/2014    Procedure: LEFT ARTHROSCOPY KNEE;  Surgeon: Kerin Salen, MD;  Location: [place];  Service: Orthopedics;  Laterality: Left;  . Cataract extraction Bilateral    Family History: Reviewed as listed below Family History  Problem Relation Age of Onset  . Hypertension Mother   . Heart attack Mother   . Osteoporosis Mother   . CVA Mother   . Coronary artery disease Mother   . Hypertension Father   . Emphysema Father   . Dementia Father     father tried to commit suicide when he had dementia  . Coronary artery disease Brother   . Diabetes Brother    Social History:  Patient lives with his wife in [place]  . Marital Status: Married    Spouse Name: N/A  . Number of Children: 2  . Years of Education: N/A   Occupational History  . Not Working     Social History Main Topics  . Smoking status: Never Smoker   . Smokeless tobacco: Never Used  . Alcohol Use: 0.6 oz/week    1 Cans of beer per week     Comment: Rare  . Drug Use: No  . Sexual Activity: Not Currently   Other Topics Concern  . None   Social History Narrative     Musculoskeletal: Strength & Muscle Tone: within normal limits Gait & Station: normal Patient leans: N/A  Psychiatric Specialty Exam: HPI  Review of Systems  Constitutional: Negative.   HENT: Negative.  Eyes: Negative.   Respiratory: Negative.   Cardiovascular: Negative.   Gastrointestinal: Negative.   Genitourinary: Negative.   Musculoskeletal: Negative.   Skin: Negative.   Neurological: Negative.   Endo/Heme/Allergies: Negative.   Psychiatric/Behavioral: Positive for depression. Negative for memory loss. The patient is nervous/anxious.    being frankly and she is   Blood pressure 132/80, pulse 75, height 5\' 9"  (1.753 m), weight 246 lb 12.8 oz (111.948 kg).Body mass index is 36.43 kg/(m^2).  General Appearance: Casual  Eye Contact:  Good  Speech:  Clear and Coherent and Normal Rate  Volume:  Normal  Mood:mild   anxious   Affect:  More appropriate   Thought Process:  Goal Directed, Linear and Logical  Orientation:  Full (Time, Place, and Person)  Thought Content:  WDL  Suicidal Thoughts:  No  Homicidal Thoughts:  No  Memory:  Immediate is good, recent and remote memory is good   Judgement:  Good   Insight:  Fair  Psychomotor Activity:  Normal  Concentration:  Fair  Recall:  Good   Fund of Knowledge:Fair  Language: Good  Akathisia:  No  Handed:  Right  AIMS (if indicated):  0  Assets:  Communication Skills Desire for Improvement Housing Resilience Social Support Transportation  ADL's:  Intact  Cognition: WNL  Sleep:     Is the patient at risk to self?  No. Has the patient been a risk to self in the past 6 months?  Yes.   Has the patient been a risk to self within the  distant past?  No. Is the patient a risk to others?  No. Has the patient been a risk to others in the past 6 months?  No. Has the patient been a risk to others within the distant past?  No.  Allergies:   reviewed as listed below  Allergies  Allergen Reactions  . Ciprofloxacin     GI side effects   . Hydrocodone-Acetaminophen     Itch/constipation  . Penicillins     Childhood allergy. Knot on arm Has patient had a PCN reaction causing immediate rash, facial/tongue/throat swelling, SOB or lightheadedness with hypotension: No Has patient had a PCN reaction causing severe rash involving mucus membranes or skin necrosis: No Has patient had a PCN reaction that required hospitalization No Has patient had a PCN reaction occurring within the last 10 years: No If all of the above answers are "NO", then may pro  . Zocor [Simvastatin]     Back aches    Current Medications:reviewed as listed below  Current Outpatient Prescriptions  Medication Sig Dispense Refill  . buPROPion (WELLBUTRIN SR) 150 MG 12 hr tablet Take 1 tablet (150 mg total) by mouth 2 (two) times daily. 60 tablet 2  . Butalbital-APAP-Caffeine 50-325-40 MG/15ML SOLN Take by mouth 4 (four) times daily as needed. Patient takes a capsule.    . clonazePAM (KLONOPIN) 0.5 MG tablet Take 1 tablet (0.5 mg total) by mouth at bedtime. 30 tablet 2  . cycloSPORINE (RESTASIS) 0.05 % ophthalmic emulsion 1 drop 2 (two) times daily.    Marland Kitchen doxazosin (CARDURA) 8 MG tablet Take 0.5 tablets (4 mg total) by mouth daily.    Marland Kitchen ibuprofen (ADVIL,MOTRIN) 800 MG tablet Take 800 mg by mouth as needed.     . Omega-3 Fatty Acids (FISH OIL PO) Take by mouth 1 day or 1 dose. Reported on 01/06/2016    . POTASSIUM CHLORIDE PO Take by mouth.    . pyridOXINE (VITAMIN B-6) 50  MG tablet Take 50 mg by mouth 3 (three) times a week.    . risperiDONE (RISPERDAL) 0.25 MG tablet Take 1 tablet (0.25 mg total) by mouth 2 (two) times daily. 60 tablet 2  . testosterone  cypionate (DEPOTESTOSTERONE CYPIONATE) 200 MG/ML injection Inject 1.5 mLs (300 mg total) into the muscle every 28 (twenty-eight) days. To be managed outpatient (Patient taking differently: Inject into the muscle every 28 (twenty-eight) days. To be managed outpatient) 10 mL 0  . traZODone (DESYREL) 100 MG tablet Take 1 tablet (100 mg total) by mouth at bedtime. 30 tablet 2  . TURMERIC PO Take by mouth 1 day or 1 dose.     No current facility-administered medications for this visit.    Previous Psychotropic Medications: Yes     Medical Decision Making:  New problem, with additional work up planned, Review of Psycho-Social Stressors (1), Review or order clinical lab tests (1), Review and summation of old records (2), Review of Last Therapy Session (1) and Review of Medication Regimen & Side Effects (2)  Treatment Plan Summary: Medication management  treatment plan. #1 major depression recurrent severe Patient will continue Wellbutrin SR 150 mg twice a day.   #2 insomnia Patient will continue trazodone 100 mg by mouth daily at bedtime.  #3 generalized anxiety. And obsessions  Continue Risperdal 0.25 mg by mouth twice a day ,  continue Klonopin 0.5 mg by mouth daily at bedtime. . #4 therapy. Patient has stopped seeing Tharon Aquas stating he cannot referred the co-pays.  Discussed with the patient that I'm leaving the clinic and his care will be transferred to Dr. Casimiro Needle and patient stated understanding.  he'll return to the clinic for medication follow-up in 8 weeks.  Call sooner if necessary.  . More than 50% of the time was spent  In reassuring the patient that he will get better and also discussing medications and diagnosis.. Discussed that he needed to forgive himself about all the stressors that have affected him recently. Coping skills and action alternatives to depression and anxiety was discussed. Interpersonal and supportive therapy was provided    Erin Sons 5/1/20171:10 PM

## 2016-04-21 ENCOUNTER — Ambulatory Visit (HOSPITAL_COMMUNITY): Payer: Self-pay | Admitting: Psychiatry

## 2016-04-21 DIAGNOSIS — H16223 Keratoconjunctivitis sicca, not specified as Sjogren's, bilateral: Secondary | ICD-10-CM | POA: Diagnosis not present

## 2016-04-28 DIAGNOSIS — K5900 Constipation, unspecified: Secondary | ICD-10-CM | POA: Diagnosis not present

## 2016-05-01 DIAGNOSIS — K5900 Constipation, unspecified: Secondary | ICD-10-CM | POA: Diagnosis not present

## 2016-05-01 DIAGNOSIS — K921 Melena: Secondary | ICD-10-CM | POA: Diagnosis not present

## 2016-05-01 DIAGNOSIS — R1030 Lower abdominal pain, unspecified: Secondary | ICD-10-CM | POA: Diagnosis not present

## 2016-05-05 DIAGNOSIS — H16223 Keratoconjunctivitis sicca, not specified as Sjogren's, bilateral: Secondary | ICD-10-CM | POA: Diagnosis not present

## 2016-05-08 DIAGNOSIS — D123 Benign neoplasm of transverse colon: Secondary | ICD-10-CM | POA: Diagnosis not present

## 2016-05-08 DIAGNOSIS — D122 Benign neoplasm of ascending colon: Secondary | ICD-10-CM | POA: Diagnosis not present

## 2016-05-08 DIAGNOSIS — D126 Benign neoplasm of colon, unspecified: Secondary | ICD-10-CM | POA: Diagnosis not present

## 2016-05-08 DIAGNOSIS — K621 Rectal polyp: Secondary | ICD-10-CM | POA: Diagnosis not present

## 2016-05-08 DIAGNOSIS — R1013 Epigastric pain: Secondary | ICD-10-CM | POA: Diagnosis not present

## 2016-05-08 DIAGNOSIS — C20 Malignant neoplasm of rectum: Secondary | ICD-10-CM | POA: Diagnosis not present

## 2016-05-08 DIAGNOSIS — K921 Melena: Secondary | ICD-10-CM | POA: Diagnosis not present

## 2016-05-08 DIAGNOSIS — K5900 Constipation, unspecified: Secondary | ICD-10-CM | POA: Diagnosis not present

## 2016-05-13 ENCOUNTER — Other Ambulatory Visit: Payer: Self-pay | Admitting: Gastroenterology

## 2016-05-13 DIAGNOSIS — C20 Malignant neoplasm of rectum: Secondary | ICD-10-CM

## 2016-05-19 ENCOUNTER — Ambulatory Visit
Admission: RE | Admit: 2016-05-19 | Discharge: 2016-05-19 | Disposition: A | Discharge status: Home / Self Care | Payer: PPO | Source: Ambulatory Visit | Attending: Gastroenterology | Admitting: Gastroenterology

## 2016-05-19 ENCOUNTER — Other Ambulatory Visit: Payer: Self-pay

## 2016-05-19 DIAGNOSIS — R918 Other nonspecific abnormal finding of lung field: Secondary | ICD-10-CM | POA: Diagnosis not present

## 2016-05-19 DIAGNOSIS — C19 Malignant neoplasm of rectosigmoid junction: Secondary | ICD-10-CM | POA: Diagnosis not present

## 2016-05-19 DIAGNOSIS — C20 Malignant neoplasm of rectum: Secondary | ICD-10-CM

## 2016-05-19 MED ORDER — IOPAMIDOL (ISOVUE-300) INJECTION 61%
125.0000 mL | Freq: Once | INTRAVENOUS | Status: AC | PRN
  Administered 2016-05-19: 125 mL via INTRAVENOUS

## 2016-05-20 ENCOUNTER — Other Ambulatory Visit: Payer: Self-pay | Admitting: Gastroenterology

## 2016-05-21 ENCOUNTER — Encounter (HOSPITAL_COMMUNITY): Payer: Self-pay | Admitting: *Deleted

## 2016-05-21 ENCOUNTER — Encounter (HOSPITAL_COMMUNITY)
Admission: RE | Disposition: A | Discharge status: Home / Self Care | Payer: Self-pay | Source: Ambulatory Visit | Attending: Gastroenterology

## 2016-05-21 ENCOUNTER — Ambulatory Visit (HOSPITAL_COMMUNITY)
Admission: RE | Admit: 2016-05-21 | Discharge: 2016-05-21 | Disposition: A | Discharge status: Home / Self Care | Payer: PPO | Source: Ambulatory Visit | Attending: Gastroenterology | Admitting: Gastroenterology

## 2016-05-21 DIAGNOSIS — I10 Essential (primary) hypertension: Secondary | ICD-10-CM | POA: Insufficient documentation

## 2016-05-21 DIAGNOSIS — Z96651 Presence of right artificial knee joint: Secondary | ICD-10-CM | POA: Diagnosis not present

## 2016-05-21 DIAGNOSIS — Z6836 Body mass index (BMI) 36.0-36.9, adult: Secondary | ICD-10-CM | POA: Insufficient documentation

## 2016-05-21 DIAGNOSIS — C20 Malignant neoplasm of rectum: Secondary | ICD-10-CM | POA: Diagnosis not present

## 2016-05-21 DIAGNOSIS — Z79899 Other long term (current) drug therapy: Secondary | ICD-10-CM | POA: Diagnosis not present

## 2016-05-21 DIAGNOSIS — E669 Obesity, unspecified: Secondary | ICD-10-CM | POA: Diagnosis not present

## 2016-05-21 DIAGNOSIS — K626 Ulcer of anus and rectum: Secondary | ICD-10-CM | POA: Insufficient documentation

## 2016-05-21 DIAGNOSIS — K6289 Other specified diseases of anus and rectum: Secondary | ICD-10-CM | POA: Insufficient documentation

## 2016-05-21 HISTORY — PX: FLEXIBLE SIGMOIDOSCOPY: SHX5431

## 2016-05-21 HISTORY — PX: EUS: SHX5427

## 2016-05-21 SURGERY — SIGMOIDOSCOPY, FLEXIBLE
Anesthesia: Moderate Sedation

## 2016-05-21 MED ORDER — SPOT INK MARKER SYRINGE KIT
PACK | SUBMUCOSAL | Status: DC | PRN
  Administered 2016-05-21: 2 mL via SUBMUCOSAL

## 2016-05-21 MED ORDER — MIDAZOLAM HCL 10 MG/2ML IJ SOLN
INTRAMUSCULAR | Status: DC | PRN
  Administered 2016-05-21: 2 mg via INTRAVENOUS
  Administered 2016-05-21: 1 mg via INTRAVENOUS
  Administered 2016-05-21: 2 mg via INTRAVENOUS

## 2016-05-21 MED ORDER — MIDAZOLAM HCL 5 MG/ML IJ SOLN
INTRAMUSCULAR | Status: AC
Start: 2016-05-21 — End: 2016-05-21
  Filled 2016-05-21: qty 2

## 2016-05-21 MED ORDER — SODIUM CHLORIDE 0.9 % IV SOLN: INTRAVENOUS | Status: DC

## 2016-05-21 MED ORDER — FLEET ENEMA 7-19 GM/118ML RE ENEM
ENEMA | RECTAL | Status: AC
  Filled 2016-05-21: qty 1

## 2016-05-21 MED ORDER — FENTANYL CITRATE (PF) 100 MCG/2ML IJ SOLN
INTRAMUSCULAR | Status: DC | PRN
  Administered 2016-05-21 (×2): 25 ug via INTRAVENOUS

## 2016-05-21 MED ORDER — SPOT INK MARKER SYRINGE KIT
PACK | SUBMUCOSAL | Status: AC
  Filled 2016-05-21: qty 5

## 2016-05-21 MED ORDER — FENTANYL CITRATE (PF) 100 MCG/2ML IJ SOLN
INTRAMUSCULAR | Status: AC
  Filled 2016-05-21: qty 2

## 2016-05-21 MED ORDER — FLEET ENEMA 7-19 GM/118ML RE ENEM
1.0000 | ENEMA | Freq: Two times a day (BID) | RECTAL | Status: DC | PRN
  Administered 2016-05-21 (×2): 1 via RECTAL

## 2016-05-21 NOTE — Op Note (Signed)
[facility] Patient Name: Brad Miller Procedure Date : 05/21/2016 MRN: [id number] Attending MD: Arta Silence , MD Date of Birth: June 20, 1951 CSN: [id number] Age: 65 Admit Type: Outpatient Procedure:                Lower EUS Indications:              Abnormal wall thickening found in the rectum on                            flex sig/colonoscopy, Rectal mucosal mass found on                            flex sig/colonoscopy (rectal polyp containing                            adenocarcinoma, piecemeal removed on recent                            colonoscopy, no lymphovascular invasion, unclear                            margins) Providers:                Arta Silence, MD, Carolynn Comment, RN, Shanicka Oldenkamp Dalton, Technician Referring MD:             Carlos Levering, PA-C Medicines:                Fentanyl 50 micrograms IV, Midazolam 5 mg IV Complications:            No immediate complications. Estimated Blood Loss:     Estimated blood loss was minimal. Procedure:                Pre-Anesthesia Assessment:                           - Prior to the procedure, a History and Physical                            was performed, and patient medications and                            allergies were reviewed. The patient's tolerance of                            previous anesthesia was also reviewed. The risks                            and benefits of the procedure and the sedation                            options and risks were discussed with the patient.  All questions were answered, and informed consent                            was obtained. Prior Anticoagulants: The patient has                            taken no previous anticoagulant or antiplatelet                            agents. ASA Grade Assessment: II - A patient with                            mild systemic disease. After reviewing the risks       and benefits, the patient was deemed in                            satisfactory condition to undergo the procedure.                           After obtaining informed consent, the endoscope was                            passed under direct vision. Throughout the                            procedure, the patient's blood pressure, pulse, and                            oxygen saturations were monitored continuously. The                            [id number]) scope was introduced through                            the anus and advanced to the the sigmoid colon. The                            lower EUS was accomplished without difficulty. The                            patient tolerated the procedure well. The quality                            of the bowel preparation was adequate. Scope In: Scope Out: Findings:      The perianal and digital rectal examinations were normal.      Endosonographic and Sigmoidoscopy Findings: :      Rectal ulcer at 12cm from anal verge, representative of prior rectal       polypectomy site; no obvious residual dysplastic tissue was       endoscopically visible. After endorectal ultrasound study was completed       (see below), radial EUS scope was exchanged in favor of diagnostic       gastroscope, and biopsies around rim of polypectomy site were taken with  a cold forceps for histology. Estimated blood loss was minimal. Area was       tattooed with an injection of 2 mL of [place] ink.      The perirectal space was normal. Subtle wall thickness to superficial       mucosal layer of rectum was seen at polypectomy site, likely from prior       cautery artifact; however, no tumor extension or invasion was noted.      No lymph nodes were seen in a peritumoral location during       endosonographic examination of the proximal (upper) rectum. Impression:               - Endosonographic images of the perirectal space                            were  unremarkable.                           - No lymph nodes were seen in a peritumoral                            location during endosonographic examination of the                            proximal (upper) rectum.                           - No evidence of residual tumor seen at rectal                            polypectomy site; margins of polypectomy ulcer were                            biopsied with cold forceps and polypectomy site was                            marked with [place] Ink.                           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 Moderate Sedation:      Moderate (conscious) sedation was administered by the endoscopy nurse       and supervised by the endoscopist. The following parameters were       monitored: oxygen saturation, heart rate, blood pressure, and response       to care. Recommendation:           - Discharge patient to home (via wheelchair).                           - Resume previous diet today.                           -  Continue present medications.                           - Await path results.                           - Return to GI clinic in 3 weeks. Pending biopsy                            results, options include repeat sigmoidoscopy in 3                            months for surveillance versus surgical referral                            for consideration of transanal excision at site of                            polypectomy bed.                           - Return to referring physician as previously                            scheduled. Procedure Code(s):        --- Professional ---                           [phone], Sigmoidoscopy, flexible; with endoscopic                            ultrasound examination                           [id number], Sigmoidoscopy, flexible; with biopsy, single                             or multiple                           [id number], Sigmoidoscopy, flexible; with directed                            submucosal injection(s), any substance Diagnosis Code(s):        --- Professional ---                           K62.89, Other specified diseases of anus and rectum CPT copyright 2016 American Medical Association. All rights reserved. The codes documented in this report are preliminary and upon coder review may  be revised to meet current compliance requirements. Arta Silence, MD 05/21/2016 2:07:02 PM This report has been signed electronically. Number of Addenda: 0

## 2016-05-21 NOTE — H&P (Signed)
Patient interval history reviewed.  Patient examined again.  There has been no change from documented H/P (scanned into chart from our office) except as documented above.  Assessment:  1.  Rectal polyp, with adenocarcinoma.  Plan:  1.  Rectal ultrasound for locoregional staging.  Sigmoidoscopy for possible mucosal biopsies and tattoo of polypectomy site. 2.  Risks (bleeding, infection, bowel perforation that could require surgery, sedation-related changes in cardiopulmonary systems), benefits (identification and possible treatment of source of symptoms, exclusion of certain causes of symptoms), and alternatives (watchful waiting, radiographic imaging studies, empiric medical treatment) of endorectal ultrasound and sigmoidoscopy were explained to patient/family in detail and patient wishes to proceed.

## 2016-05-21 NOTE — Discharge Instructions (Signed)
Flexible sigmoidoscopy and rectal ultrasound:  Post procedure instructions:  Read the instructions outlined below and refer to this sheet in the next few weeks. These discharge instructions provide you with general information on caring for yourself after you leave the hospital. Your doctor may also give you specific instructions. While your treatment has been planned according to the most current medical practices available, unavoidable complications occasionally occur. If you have any problems or questions after discharge, call Dr. Paulita Fujita at [facility] [phone]).  HOME CARE INSTRUCTIONS  ACTIVITY:  You may resume your regular activity, but move at a slower pace for the next 24 hours.   Take frequent rest periods for the next 24 hours.   Walking will help get rid of the air and reduce the bloated feeling in your belly (abdomen).   No driving for 24 hours (because of the medicine (anesthesia) used during the test).   You may shower.   Do not sign any important legal documents or operate any machinery for 24 hours (because of the anesthesia used during the test).  NUTRITION:  Drink plenty of fluids.   You may resume your normal diet as instructed by your doctor.   Begin with a light meal and progress to your normal diet. Heavy or fried foods are harder to digest and may make you feel sick to your stomach (nauseated).   Avoid alcoholic beverages for 24 hours or as instructed.  MEDICATIONS:  You may resume your normal medications unless your doctor tells you otherwise.  WHAT TO EXPECT TODAY:  Some feelings of bloating in the abdomen.   Passage of more gas than usual.   Spotting of blood in your stool or on the toilet paper.  IF YOU HAD POLYPS REMOVED DURING THE COLONOSCOPY:  No aspirin products for 7 days or as instructed.   No alcohol for 7 days or as instructed.   Eat a soft diet for the next 24 hours.   FINDING OUT THE RESULTS OF YOUR TEST  Not all test  results are available during your visit. If your test results are not back during the visit, make an appointment with your caregiver to find out the results. Do not assume everything is normal if you have not heard from your caregiver or the medical facility. It is important for you to follow up on all of your test results.     SEEK IMMEDIATE MEDICAL CARE IF:   You have more than a spotting of blood in your stool.   Your belly is swollen (abdominal distention).   You are nauseated or vomiting.   You have a fever.   You have abdominal pain or discomfort that is severe or gets worse throughout the day.    Document Released: 07/21/2004 Document Revised: 08/19/2011 Document Reviewed: 07/19/2008 [facility] Patient Information 2012 [place].   Moderate Conscious Sedation, Adult, Care After Refer to this sheet in the next few weeks. These instructions provide you with information on caring for yourself after your procedure. Your health care provider may also give you more specific instructions. Your treatment has been planned according to current medical practices, but problems sometimes occur. Call your health care provider if you have any problems or questions after your procedure. WHAT TO EXPECT AFTER THE PROCEDURE  After your procedure:  You may feel sleepy, clumsy, and have poor balance for several hours.  Vomiting may occur if you eat too soon after the procedure. HOME CARE INSTRUCTIONS  Do not participate in any activities  where you could become injured for at least 24 hours. Do not:  Drive.  Swim.  Ride a bicycle.  Operate heavy machinery.  Cook.  Use power tools.  Climb ladders.  Work from a high place.  Do not make important decisions or sign legal documents until you are improved.  If you vomit, drink water, juice, or soup when you can drink without vomiting. Make sure you have little or no nausea before eating solid foods.  Only take over-the-counter or  prescription medicines for pain, discomfort, or fever as directed by your health care provider.  Make sure you and your family fully understand everything about the medicines given to you, including what side effects may occur.  You should not drink alcohol, take sleeping pills, or take medicines that cause drowsiness for at least 24 hours.  If you smoke, do not smoke without supervision.  If you are feeling better, you may resume normal activities 24 hours after you were sedated.  Keep all appointments with your health care provider. SEEK MEDICAL CARE IF:  Your skin is pale or bluish in color.  You continue to feel nauseous or vomit.  Your pain is getting worse and is not helped by medicine.  You have bleeding or swelling.  You are still sleepy or feeling clumsy after 24 hours. SEEK IMMEDIATE MEDICAL CARE IF:  You develop a rash.  You have difficulty breathing.  You develop any type of allergic problem.  You have a fever. MAKE SURE YOU:  Understand these instructions.  Will watch your condition.  Will get help right away if you are not doing well or get worse.   This information is not intended to replace advice given to you by your health care provider. Make sure you discuss any questions you have with your health care provider.   Document Released: 09/27/2013 Document Revised: 12/28/2014 Document Reviewed: 09/27/2013 Elsevier Interactive Patient Education [employer].

## 2016-05-22 ENCOUNTER — Encounter (HOSPITAL_COMMUNITY): Payer: Self-pay | Admitting: Gastroenterology

## 2016-05-29 DIAGNOSIS — R350 Frequency of micturition: Secondary | ICD-10-CM | POA: Diagnosis not present

## 2016-05-29 DIAGNOSIS — I10 Essential (primary) hypertension: Secondary | ICD-10-CM | POA: Diagnosis not present

## 2016-05-29 DIAGNOSIS — H6122 Impacted cerumen, left ear: Secondary | ICD-10-CM | POA: Diagnosis not present

## 2016-05-29 DIAGNOSIS — E785 Hyperlipidemia, unspecified: Secondary | ICD-10-CM | POA: Diagnosis not present

## 2016-05-29 DIAGNOSIS — C20 Malignant neoplasm of rectum: Secondary | ICD-10-CM | POA: Diagnosis not present

## 2016-05-29 DIAGNOSIS — F5101 Primary insomnia: Secondary | ICD-10-CM | POA: Diagnosis not present

## 2016-06-08 DIAGNOSIS — C20 Malignant neoplasm of rectum: Secondary | ICD-10-CM | POA: Diagnosis not present

## 2016-06-08 DIAGNOSIS — R350 Frequency of micturition: Secondary | ICD-10-CM | POA: Diagnosis not present

## 2016-06-08 DIAGNOSIS — Z01818 Encounter for other preprocedural examination: Secondary | ICD-10-CM | POA: Diagnosis not present

## 2016-06-12 ENCOUNTER — Ambulatory Visit: Payer: Self-pay | Admitting: Surgery

## 2016-06-12 NOTE — H&P (Signed)
Brad Miller 06/08/2016 3:42 PM Location: [facility] Patient #: [id number] DOB: Apr 02, 1951 Married / Language: English / Race: White Male   History of Present Illness [name] MD; 06/08/2016 6:38 PM) The patient is a 65 year old male who presents with a colorectal polyp. Note for "Colorectal polyp": Patient sent for surgical consultation at the request of Dr. Paulita Fujita. Concern for rectal mass.  Patient comes in today with his wife. Underwent for screening colonoscopy. Some polyps removed. 2 cm polyp in proximal rectum removed in a piecemeal fashion. Apparently and had adenocarcinoma within it. He'll follow up biopsies that were negative. Endorectal ultrasound showed no deeper area. CT scan of chest and pelvis otherwise underwhelming. Surgical consultation recommended see if he needs more further resection.  Patient denies any pain. No rectal bleeding. He does have a fair amount of urinary urgency and frequency. He's never seen a urologist. Normal urinalysis checked. Usually moves his bowels once or twice a day. He can walk 20 minutes without difficulty. He does not smoke. Does have some anxiety and depression issues bed nothing recently.  No personal nor family history of inflammatory bowel disease, irritable bowel syndrome, allergy such as Celiac Sprue, dietary/dairy problems, colitis, ulcers nor gastritis. No recent sick contacts/gastroenteritis. No travel outside the country. No changes in diet. No dysphagia to solids or liquids. No significant heartburn or reflux. No hematochezia, hematemesis, coffee ground emesis. No evidence of prior gastric/peptic ulceration.   Other Problems [name], CMA; 06/08/2016 3:42 PM) Anxiety Disorder Rectal Cancer  Past Surgical History [name], CMA; 06/08/2016 3:42 PM) Cataract Surgery Bilateral. Knee Surgery Bilateral. Vasectomy  Allergies [name], CMA; 06/08/2016 3:44 PM) Ciprofloxacin  *CHEMICALS* Hydrocodone-Acetaminophen *ANALGESICS - OPIOID* Penicillin V *PENICILLINS* Simvastatin *CHEMICALS*  Medication History [name], CMA; 06/08/2016 3:46 PM) TraZODone HCl (100MG  Tablet, Oral) Active. KlonoPIN (1MG  Tablet, Oral) Active. MiraLax (Oral) Active. Tylenol (325MG  Tablet, Oral) Active. Ibuprofen (800MG  Tablet, Oral) Active. Potassium (99MG  Tablet, Oral) Active. Cyanocobalamin (1000MCG Tablet, Oral) Active. Cardura XL (8MG  Tablet ER 24HR, Oral) Active. Wellbutrin SR (150MG  Tablet ER, Oral two times daily) Active. Restasis (0.05% Emulsion, Ophthalmic) Active. Turmeric (500MG  Capsule, Oral) Active. RisperiDONE (0.25MG  Tablet, Oral) Active. Medications Reconciled  Social History [name], [place]; 06/08/2016 3:42 PM) Alcohol use Occasional alcohol use. Caffeine use Carbonated beverages, Coffee, Tea. No drug use Tobacco use Never smoker.  Family History [name], [place]; 06/08/2016 3:42 PM) Arthritis Mother. Depression Father.    Review of Systems [name] CMA; 06/08/2016 3:42 PM) Skin Present- Dryness. Not Present- Change in Wart/Mole, Hives, Jaundice, New Lesions, Non-Healing Wounds, Rash and Ulcer. HEENT Present- Wears glasses/contact lenses. Not Present- Earache, Hearing Loss, Hoarseness, Nose Bleed, Oral Ulcers, Ringing in the Ears, Seasonal Allergies, Sinus Pain, Sore Throat, Visual Disturbances and Yellow Eyes. Respiratory Present- Snoring. Not Present- Bloody sputum, Chronic Cough, Difficulty Breathing and Wheezing. Breast Not Present- Breast Mass, Breast Pain, Nipple Discharge and Skin Changes. Gastrointestinal Present- Constipation. Not Present- Abdominal Pain, Bloating, Bloody Stool, Change in Bowel Habits, Chronic diarrhea, Difficulty Swallowing, Excessive gas, Gets full quickly at meals, Hemorrhoids, Indigestion, Nausea, Rectal Pain and Vomiting. Male Genitourinary Present- Frequency. Not Present- Blood in Urine, Change in  Urinary Stream, Impotence, Nocturia, Painful Urination, Urgency and Urine Leakage. Musculoskeletal Not Present- Back Pain, Joint Pain, Joint Stiffness, Muscle Pain, Muscle Weakness and Swelling of Extremities. Psychiatric Present- Anxiety and Depression. Not Present- Bipolar, Change in Sleep Pattern, Fearful and Frequent crying. Endocrine Not Present- Cold Intolerance, Excessive Hunger, Hair Changes, Heat Intolerance,  Hot flashes and New Diabetes. Hematology Not Present- Easy Bruising, Excessive bleeding, Gland problems, HIV and Persistent Infections.  Vitals [name] CMA; 06/08/2016 3:47 PM) 06/08/2016 3:46 PM Weight: 241 lb Height: 69in Body Surface Area: 2.24 m Body Mass Index: 35.59 kg/m  Temp.: 52F(Temporal)  Pulse: 90 (Regular)  BP: 132/74 (Sitting, Left Arm, Standard)       Physical Exam [name] MD; 06/08/2016 6:36 PM) General Mental Status-Alert. General Appearance-Not in acute distress, Not Sickly. Orientation-Oriented X3. Hydration-Well hydrated. Voice-Normal.  Integumentary Global Assessment Upon inspection and palpation of skin surfaces of the - Axillae: non-tender, no inflammation or ulceration, no drainage. and Distribution of scalp and body hair is normal. General Characteristics Temperature - normal warmth is noted.  Head and Neck Head-normocephalic, atraumatic with no lesions or palpable masses. Face Global Assessment - atraumatic, no absence of expression. Neck Global Assessment - no abnormal movements, no bruit auscultated on the right, no bruit auscultated on the left, no decreased range of motion, non-tender. Trachea-midline. Thyroid Gland Characteristics - non-tender.  Eye Eyeball - Left-Extraocular movements intact, No Nystagmus. Eyeball - Right-Extraocular movements intact, No Nystagmus. Cornea - Left-No Hazy. Cornea - Right-No Hazy. Sclera/Conjunctiva - Left-No scleral icterus, No  Discharge. Sclera/Conjunctiva - Right-No scleral icterus, No Discharge. Pupil - Left-Direct reaction to light normal. Pupil - Right-Direct reaction to light normal.  ENMT Ears Pinna - Left - no drainage observed, no generalized tenderness observed. Right - no drainage observed, no generalized tenderness observed. Nose and Sinuses External Inspection of the Nose - no destructive lesion observed. Inspection of the nares - Left - quiet respiration. Right - quiet respiration. Mouth and Throat Lips - Upper Lip - no fissures observed, no pallor noted. Lower Lip - no fissures observed, no pallor noted. Nasopharynx - no discharge present. Oral Cavity/Oropharynx - Tongue - no dryness observed. Oral Mucosa - no cyanosis observed. Hypopharynx - no evidence of airway distress observed.  Chest and Lung Exam Inspection Movements - Normal and Symmetrical. Accessory muscles - No use of accessory muscles in breathing. Palpation Palpation of the chest reveals - Non-tender. Auscultation Breath sounds - Normal and Clear.  Cardiovascular Auscultation Rhythm - Regular. Murmurs & Other Heart Sounds - Auscultation of the heart reveals - No Murmurs and No Systolic Clicks.  Abdomen Inspection Inspection of the abdomen reveals - No Visible peristalsis and No Abnormal pulsations. Umbilicus - No Bleeding, No Urine drainage. Palpation/Percussion Palpation and Percussion of the abdomen reveal - Soft, Non Tender, No Rebound tenderness, No Rigidity (guarding) and No Cutaneous hyperesthesia. Note: Obese but soft. Mild diastases. No umbilical hernia. Nondistended. Nontender   Male Genitourinary Sexual Maturity Tanner 5 - Adult hair pattern and Adult penile size and shape. Note: No inguinal hernias. Normal external genitalia. Epididymi, testes, and spermatic cords normal without any masses.   Rectal Note: Exam done with assistance of male Medical Assistant in the room. Perianal skin clean with good  hygiene. No pruritis ani. No pilonidal disease. No fissure. No abscess/fistula. Normal sphincter tone. Tolerates digital and anoscopic rectal exam. No rectal masses felt to 8cm. Prostate mildly enlarged but not nodular. No external hemorrhoids. No significant internal hemorrhoidal piles   Peripheral Vascular Upper Extremity Inspection - Left - No Cyanotic nailbeds, Not Ischemic. Right - No Cyanotic nailbeds, Not Ischemic.  Neurologic Neurologic evaluation reveals -normal attention span and ability to concentrate, able to name objects and repeat phrases. Appropriate fund of knowledge , normal sensation and normal coordination. Mental Status Affect - not angry, not  paranoid. Cranial Nerves-Normal Bilaterally. Gait-Normal.  Neuropsychiatric Mental status exam performed with findings of-able to articulate well with normal speech/language, rate, volume and coherence, thought content normal with ability to perform basic computations and apply abstract reasoning and no evidence of hallucinations, delusions, obsessions or homicidal/suicidal ideation.  Musculoskeletal Global Assessment Spine, Ribs and Pelvis - no instability, subluxation or laxity. Right Upper Extremity - no instability, subluxation or laxity.  Lymphatic Head & Neck  General Head & Neck Lymphatics: Bilateral - Description - No Localized lymphadenopathy. Axillary  General Axillary Region: Bilateral - Description - No Localized lymphadenopathy. Femoral & Inguinal  Generalized Femoral & Inguinal Lymphatics: Left - Description - No Localized lymphadenopathy. Right - Description - No Localized lymphadenopathy.    Assessment & Plan [name] MD; 06/08/2016 6:37 PM) RECTAL ADENOCARCINOMA (C20) Impression: Patient with adenocarcinoma and a rectal polyp that was removed in a piecemeal fashion.  I think he would benefit from further resection. I need to get a copy his original pathology report. I think he would  benefit from better margins. Consider TEM resection at the least. If it is more high risk, consider proceeding with low anterior resection (hopefully not too likely).  He seems disappointed that he might need another surgery. Seem to be debating whether he wanted to proceed or not. His wife feels more strongly to proceed.  Once I get the complete paperwork, I'll be able to give him for formal opinion depending on what the initial pathology showed.  ADDENDUM.  D/w Dr Carleene Cooper with [facility].  No new info can be gleened from the slides.  Doubt sm3 but hard to say = more likely sm1/2.  I recommend TEM resection for better margins.  If >T1, may need LAR later - we will see.   PREOP COLON - ENCOUNTER FOR PREOPERATIVE EXAMINATION FOR GENERAL SURGICAL PROCEDURE (Z01.818) Current Plans You are being scheduled for surgery - Our schedulers will call you.  You should hear from our office's scheduling department within 5 working days about the location, date, and time of surgery. We try to make accommodations for patient's preferences in scheduling surgery, but sometimes the OR schedule or the surgeon's schedule prevents [place] from making those accommodations.  If you have not heard from our office [phone] in 5 working days, call the office and ask for your surgeon's nurse.  If you have other questions about your diagnosis, plan, or surgery, call the office and ask for your surgeon's nurse.  Written instructions provided Pt Education - [facility] TEM Education (Mykaela Arena): discussed with patient and provided information. Pt Education - [facility] Colon Bowel Prep 2015 Miralax/Antibiotics Started Neomycin Sulfate 500MG , 2 (two) Tablet SEE NOTE, #6, 06/08/2016, No Refill. Local Order: TAKE TWO TABLETS AT 2 PM, 3 PM, AND 10 PM THE DAY PRIOR TO SURGERY Started Flagyl 500MG , 2 (two) Tablet SEE NOTE, #6, 06/08/2016, No Refill. Local Order: Take at 2pm, 3pm, and 10pm the day prior to your colon operation Pt Education -  [place] (Husein Guedes) URINARY FREQUENCY (R35.0) Impression: Increased urgency and frequency. Apparently normal UA and doctors primary care office.  I think he would benefit from urology evaluation. Wife strongly agrees. We'll see if we can set that up through [facility] to help him out.

## 2016-06-18 DIAGNOSIS — R3915 Urgency of urination: Secondary | ICD-10-CM | POA: Diagnosis not present

## 2016-06-18 DIAGNOSIS — R351 Nocturia: Secondary | ICD-10-CM | POA: Diagnosis not present

## 2016-06-18 DIAGNOSIS — R350 Frequency of micturition: Secondary | ICD-10-CM | POA: Diagnosis not present

## 2016-06-29 DIAGNOSIS — C20 Malignant neoplasm of rectum: Secondary | ICD-10-CM | POA: Diagnosis not present

## 2016-07-01 ENCOUNTER — Ambulatory Visit (INDEPENDENT_AMBULATORY_CARE_PROVIDER_SITE_OTHER): Payer: PPO | Admitting: Psychiatry

## 2016-07-01 ENCOUNTER — Encounter (HOSPITAL_COMMUNITY): Payer: Self-pay | Admitting: Psychiatry

## 2016-07-01 VITALS — BP 128/76 | HR 82 | Ht 69.0 in | Wt 241.4 lb

## 2016-07-01 DIAGNOSIS — F339 Major depressive disorder, recurrent, unspecified: Secondary | ICD-10-CM | POA: Diagnosis not present

## 2016-07-01 MED ORDER — TRAZODONE HCL 100 MG PO TABS: ORAL_TABLET | ORAL | Status: DC

## 2016-07-01 MED ORDER — RISPERIDONE 0.25 MG PO TABS: ORAL_TABLET | ORAL | Status: DC

## 2016-07-01 MED ORDER — BUPROPION HCL ER (XL) 300 MG PO TB24: 300.0000 mg | ORAL_TABLET | Freq: Every day | ORAL | Status: DC

## 2016-07-01 NOTE — Progress Notes (Signed)
Patient ID: Brad Miller, male   DOB: [birth year], 65 y.o.   MRN: [id number] [facility] MD Progress Note.  Patient Identification: Brad Miller MRN:  [id number]  Subjective-   I am fine    Visit Diagnosis:   No diagnosis found. Diagnosis:   Patient Active Problem List   Diagnosis Date Noted  . OCD (obsessive compulsive disorder) [F42.9] 12/31/2015  . Memory changes [R41.3] 12/27/2015  . Depression [F32.9] 12/27/2015  . Generalized anxiety disorder [F41.1] 11/12/2015  . MDD (major depressive disorder), recurrent severe, without psychosis ([place]) [F33.2] 11/02/2015  . Panic attacks [F41.0] 11/02/2015  . Acute confusional state [F05] 10/31/2015  . 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 Arthritis of right knee [M12.9] 02/23/2014   History of Present Illness:   Today this patient is seen in initial evaluation with me. He's been under the care of this setting with Dr. Salem Senate being treated for clinical depression take questions about obsessive-compulsive disorder. Patient is been psychiatrically hospitalized a year or 2 at his only psychiatric hospital. For depression. At this time the patient denies daily depression. He is sleeping and eating well. He's got good energy. He denies anhedonia. 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 He's been retired for about 2 years. He denies psychotic symptoms. He denies any use of alcohol. The patient enjoys television reading and has pets at home. At this time the patient is doing well. Financially is stable. Is happily married for 24 years and has 2 children who are both doing well. He is minimal psychosocial stressors at this time. She's been taking his medicine regularly and does well. He is asking some questions about the need for Risperdal. I found no overt evidence of OCD or psychosis in this patient. The patient denies being suicidal or homicidal. The patient says he is having somewhat of a  problem sleeping all the way through the night.   .                         Notes from initial visit on 32 /82/73 65 year old white married male referred from [facility] H inpatient unit where he was brought 10 after getting lost and also had acute onset of loss of speech was confused rocking back and forth. He was admitted to the adult inpatient unit. An MRI was done which was found to be normal.  Patient has multiple stressors that are affecting his life, #1 is financial stressors. #2 he was cheating his boss was also his nephew. #3 he had an affair and then told his wife. #4 his mother has been admitted to the group home. Patient has been feeling overwhelmed with all of this. Admits to feeling depressed but mostly feels overwhelmed and anxious and states that his memory is poor and his concentration is bad.  Patient lives in [place] with his wife he has 2 children from his first wife. Patient has worried about the future his worried about the finances. Short-term memory is poor, tends to get confused at times. States that he cannot read and cannot get into the book. Also has no interest in watching TV as he does not like the new shows. Discussed the target market for the new shows are the younger generation. Patient also states that the money that he got from the nephew he added [place] because he wanted to. Patient also states that sometimes his coordination is not the  greatest but he has not fallen.  Patient drives when he has to, his wife drives him around. 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 Patient was continued on his Wellbutrin SR 150 twice a day, Zoloft 25 mg was added. Trazodone 100 mg and Klonopin 0.5 twice a day when necessary.  Since discharge he states that his sleep is good, appetite is good mood is anxious he tends to ruminate about cheating on his wife and putting his mother in the group home and also cheating on his nephew. Patient blames himself.  He denies suicidal ideation denies homicidal ideation at times feels hopeless and helpless denies hallucinations or delusions.  At this point with his permission his wife joined [place] to provide further information.  Substance Abuse History in the last 12 months:  Yes.   patient's drinks 1-2 glasses of wine occasionally he is a social drinker.  Consequences of Substance Abuse: NA   Past psychiatric history.---------- Patient was hospitalized 20 years ago at [facility] psychiatric unit because of leaving his first wife to marry his current wife and felt guilty about it. No outpatient follow-up. Hospitalized at [facility] H adult unit at [facility] from 11/01/2015 01/31/1659   Past Medical History: Reviewed as listed below Past Medical History  Diagnosis Date  . Hyperlipidemia     TAKES LOVASTATIN NIGHTLY  . Depression     TAKES WELLBUTRIN DAILY  . Anxiety     TAKES XANAX DAILY AS NEEDED  . Hypertension     TAKES CARDURA DAILY  . Sleep apnea     USES CPAP  . Headache(784.0)     OCCASIONALLY  . Arthritis   . Joint pain   . Urinary frequency   . Urinary urgency   . Enlarged prostate   . Diabetes mellitus without complication (HCC)     BORDERLINE  . Cataracts, bilateral   . Rash     AROUND NOSE AND STATES D/T NOT USING LOTION WHILE SHAVING    Past Surgical History  Procedure Laterality Date  . 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 Total knee arthroplasty Right 02/23/2014    Procedure: TOTAL KNEE ARTHROPLASTY;  Surgeon: Kerin Salen, MD;  Location: [place];  Service: Orthopedics;  Laterality: Right;  . Knee arthroscopy Left 02/23/2014    Procedure: LEFT ARTHROSCOPY KNEE;  Surgeon: Kerin Salen, MD;  Location: [place];  Service: Orthopedics;  Laterality: Left;  . Cataract extraction Bilateral   . Flexible sigmoidoscopy N/A 05/21/2016    Procedure: FLEXIBLE SIGMOIDOSCOPY;  Surgeon: Arta Silence, MD;  Location: [facility] ENDOSCOPY;  Service: Endoscopy;  Laterality: N/A;  . Eus N/A 05/21/2016     Procedure: LOWER ENDOSCOPIC ULTRASOUND (EUS);  Surgeon: Arta Silence, MD;  Location: [facility] ENDOSCOPY;  Service: Endoscopy;  Laterality: N/A;   Family History: Reviewed as listed below Family History  Problem Relation Age of Onset  . Hypertension Mother   . Heart attack Mother   . Osteoporosis Mother   . CVA Mother   . Coronary artery disease Mother   . Hypertension Father   . Emphysema Father   . Dementia Father     father tried to commit suicide when he had dementia  . Coronary artery disease Brother   . Diabetes Brother    Social History:  Patient lives with his wife in [place]  . Marital Status: Married    Spouse Name: N/A  . Number of  Children: 2  . Years of Education: N/A   Occupational History  . Not Working    Social History Main Topics  . Smoking status: Never Smoker   . Smokeless tobacco: Never Used  . Alcohol Use: 0.6 oz/week    1 Cans of beer per week     Comment: Rare  . Drug Use: No  . Sexual Activity: Not Currently   Other Topics Concern  . None   Social History Narrative     Musculoskeletal: Strength & Muscle Tone: within normal limits Gait & Station: normal Patient leans: N/A  Psychiatric Specialty Exam: HPI  Review of Systems  Constitutional: Negative.   HENT: Negative.   Eyes: Negative.   Respiratory: Negative.   Cardiovascular: Negative.   Gastrointestinal: Negative.   Genitourinary: Negative.   Musculoskeletal: Negative.   Skin: Negative.   Neurological: Negative.   Endo/Heme/Allergies: Negative.   Psychiatric/Behavioral: Positive for depression. Negative for memory loss. The patient is nervous/anxious.    being frankly and she is   Blood pressure 128/76, pulse 82, height 5\' 9"  (1.753 m), weight 241 lb 6.4 oz (109.498 kg).Body mass index is 35.63 kg/(m^2).  General Appearance: Casual  Eye Contact:  Good  Speech:  Clear and Coherent and Normal Rate  Volume:  Normal  Mood:mild   anxious    Affect:  More appropriate   Thought Process:  Goal Directed, Linear and Logical  Orientation:  Full (Time, Place, and Person)  Thought Content:  WDL  Suicidal Thoughts:  No  Homicidal Thoughts:  No  Memory:  Immediate is good, recent and remote memory is good   Judgement:  Good   Insight:  Fair  Psychomotor Activity:  Normal  Concentration:  Fair  Recall:  Good   Fund of Knowledge:Fair  Language: Good  Akathisia:  No  Handed:  Right  AIMS (if indicated):  0  Assets:  Communication Skills Desire for Improvement Housing Resilience Social Support Transportation  ADL's:  Intact  Cognition: WNL  Sleep:     Is the patient at risk to self?  No. Has the patient been a risk to self in the past 6 months?  Yes.   Has the patient been a risk to self within the distant past?  No. Is the patient a risk to others?  No. Has the patient been a risk to others in the past 6 months?  No. Has the patient been a risk to others within the distant past?  No.  Allergies:   reviewed as listed below  Allergies  Allergen Reactions  . Ciprofloxacin     GI side effects   . Penicillins     Childhood allergy. Knot on arm Has patient had a PCN reaction causing immediate rash, facial/tongue/throat swelling, SOB or lightheadedness with hypotension: No Has patient had a PCN reaction causing severe rash involving mucus membranes or skin necrosis: No Has patient had a PCN reaction that required hospitalization No Has patient had a PCN reaction occurring within the last 10 years: No If all of the above answers are "NO", then may pro  . Zocor [Simvastatin]     Back aches   . Hydrocodone Itching    consitpation   Current Medications:reviewed as listed below  Current Outpatient Prescriptions  Medication Sig Dispense Refill  . buPROPion (WELLBUTRIN SR) 150 MG 12 hr tablet Take 1 tablet (150 mg total) by mouth 2 (two) times daily. 60 tablet 1  . buPROPion (WELLBUTRIN XL) 300 MG 24 hr tablet Take  1  tablet (300 mg total) by mouth daily. 30 tablet 4  . Butalbital-APAP-Caffeine 50-325-40 MG/15ML SOLN Take by mouth 4 (four) times daily as needed. Patient takes a capsule.    . clonazePAM (KLONOPIN) 0.5 MG tablet Take 1 tablet (0.5 mg total) by mouth at bedtime. 30 tablet 1  . cycloSPORINE (RESTASIS) 0.05 % ophthalmic emulsion 1 drop 2 (two) times daily.    Marland Kitchen doxazosin (CARDURA) 8 MG tablet Take 0.5 tablets (4 mg total) by mouth daily.    Marland Kitchen ibuprofen (ADVIL,MOTRIN) 800 MG tablet Take 800 mg by mouth as needed.     . Omega-3 Fatty Acids (FISH OIL PO) Take by mouth 1 day or 1 dose. Reported on 01/06/2016    . POTASSIUM CHLORIDE PO Take by mouth.    . pyridOXINE (VITAMIN B-6) 50 MG tablet Take 50 mg by mouth 3 (three) times a week.    . risperiDONE (RISPERDAL) 0.25 MG tablet 2  qhs 60 tablet 5  . testosterone cypionate (DEPOTESTOSTERONE CYPIONATE) 200 MG/ML injection Inject 1.5 mLs (300 mg total) into the muscle every 28 (twenty-eight) days. To be managed outpatient (Patient taking differently: Inject into the muscle every 28 (twenty-eight) days. To be managed outpatient) 10 mL 0  . traZODone (DESYREL) 100 MG tablet 1 qhs  May increase to 2  qhs 60 tablet 4  . TURMERIC PO Take by mouth 1 day or 1 dose.     No current facility-administered medications for this visit.    Previous Psychotropic Medications: Yes     Medical Decision Making At this time the patient seems to be doing well. His depression seems to be well treated. I will make some adjustments in his medicines. Instead of taking Wellbutrin 150 slow release twice a day we'll change to test taking 3 mg XL in the morning. The patient would like to take all his Risperdal one time and he can do that at night. He'll take 0.25 mg 2 at night. When he returns to see me in about 3 months we she'll discuss the need for her spare at all and look forward probably reducing and discontinuing it. At this time we will discontinue his Klonopin. The patient  will be asked if he's not sleeping well which doesn't sound like he is to go ahead and increase his trazodone to taking 200 mg at night instead of 100 mg. Today we discussed all these options and he agreed to the above plan. At this time he is very stable. . More than 50% of the time was spent  In reassuring the patient that he will get better and also discussing medications and diagnosis.. Discussed that he needed to forgive himself about all the stressors that have affected him recently. Coping skills and action alternatives to depression and anxiety was discussed. Interpersonal and supportive therapy was provided    Charlestine Night [facility] 7/12/20175:12 PM

## 2016-07-13 DIAGNOSIS — R351 Nocturia: Secondary | ICD-10-CM | POA: Diagnosis not present

## 2016-07-13 DIAGNOSIS — R350 Frequency of micturition: Secondary | ICD-10-CM | POA: Diagnosis not present

## 2016-07-14 DIAGNOSIS — M1990 Unspecified osteoarthritis, unspecified site: Secondary | ICD-10-CM | POA: Diagnosis not present

## 2016-07-22 ENCOUNTER — Telehealth (HOSPITAL_COMMUNITY): Payer: Self-pay | Admitting: Psychiatry

## 2016-07-24 NOTE — Progress Notes (Addendum)
CHEST CT 05-19-16 EPIC 11-01-15 EKG EPIC

## 2016-07-24 NOTE — Patient Instructions (Addendum)
COLON BOWEL PREP Please follow the instructions carefully. It is important to clean out your bowels & take the prescribed antibiotic pills to lower your chances of a wound infection or abscess.   FIVE DAYS PRIOR TO YOUR SURGERY Stop eating any nuts, popcorn, or fruit with seeds. Stop all fiber supplements such as Metamucil, Citrucel, etc.   Hold taking any blood thinning anticoagulation medication (ex: aspirin, warfarin/Coumadin, Plavix, Xarelto, Eliquis, Pradaxa, etc) as recommended by your medical/cardiology doctor  Obtain what you need at a pharmacy of your choice: -Filled out prescriptions for your oral antibiotics (Neomycin & Metronidazole)  -A bottle of MiraLax / Glycolax (288g) - no prescription required  -A large bottle of Gatorade / Powerade (64oz)  -Dulcolax tablets (4 tabs) - no prescription required    DAY PRIOR TO SURGERY   7:00am Swallow 4 Dulcolax tablets with some water Drink plenty of clear liquids all day to avoid getting dehydrated (Water, juice, soda, coffee, tea, bouillon, jello, etc.)  10:00am Mix the bottle of MiraLax with the 64-oz bottle of Gatorade.  Drink the Gatorade mixture gradually over the next few hours (8oz glass every 15-30 minutes) until gone. You should finish by 2pm.  2:00pm Take 2 Neomycin 500mg  tablets & 2 Metronidazole 500mg  tablets  3:00pm Take 2 Neomycin 500mg  tablets & 2 Metronidazole 500mg  tablets  Drink plenty of clear liquids all evening to avoid getting dehydrated  10:00pm Take 2 Neomycin 500mg  tablets & 2 Metronidazole 500mg  tablets  Do not eat or drink anything after bedtime (midnight) the night before your surgery.   MORNING OF SURGERY Remember to not to drink or eat anything that morning  Hold or take medications as recommended by the hospital staff at your Preoperative visit  If you have questions or concerns, please call [place] [phone] during business hours to speak to the clinical staff for  advice.                Brad Miller  07/24/2016   Your procedure is scheduled on: 07-30-16  Report to [facility] Main  Entrance take [facility]  elevators to 3rd floor to  [place] at 530  AM.  Call this number if you have problems the morning of surgery [phone]   Remember: ONLY 1 PERSON MAY GO WITH YOU TO SHORT STAY TO GET  READY MORNING OF [place].  Do not eat food or drink liquids :After Midnight.     Take these medicines the morning of surgery with A SIP OF WATER: BUPROPION (WELLBUTRIN), RESTASIS EYE DROP, RAPAFLO                              You may not have any metal on your body including hair pins and              piercings  Do not wear jewelry, make-up, lotions, powders or perfumes, deodorant             Do not wear nail polish.  Do not shave  48 hours prior to surgery.              Men may shave face and neck.   Do not bring valuables to the hospital. [place].  Contacts, dentures or bridgework may not be worn into surgery.  Leave suitcase in  the car. After surgery it may be brought to your room.                  Please read over the following fact sheets you were given: _____________________________________________________________________                CLEAR LIQUID DIET   Foods Allowed                                                                     Foods Excluded  Coffee and tea, regular and decaf                             liquids that you cannot  Plain Jell-O in any flavor                                             see through such as: Fruit ices (not with fruit pulp)                                     milk, soups, orange juice  Iced Popsicles                                    All solid food Carbonated beverages, regular and diet                                    Cranberry, grape and apple juices Sports drinks like Gatorade Lightly seasoned clear broth or consume(fat  free) Sugar, honey syrup  Sample Menu Breakfast                                Lunch                                     Supper Cranberry juice                    Beef broth                            Chicken broth Jell-O                                     Grape juice                           Apple juice Coffee or tea                        Jell-O  Popsicle                                                Coffee or tea                        Coffee or tea  _____________________________________________________________________  [facility] - Preparing for Surgery Before surgery, you can play an important role.  Because skin is not sterile, your skin needs to be as free of germs as possible.  You can reduce the number of germs on your skin by washing with CHG (chlorahexidine gluconate) soap before surgery.  CHG is an antiseptic cleaner which kills germs and bonds with the skin to continue killing germs even after washing. Please DO NOT use if you have an allergy to CHG or antibacterial soaps.  If your skin becomes reddened/irritated stop using the CHG and inform your nurse when you arrive at Short Stay. Do not shave (including legs and underarms) for at least 48 hours prior to the first CHG shower.  You may shave your face/neck. Please follow these instructions carefully:  1.  Shower with CHG Soap the night before surgery and the  morning of Surgery.  2.  If you choose to wash your hair, wash your hair first as usual with your  normal  shampoo.  3.  After you shampoo, rinse your hair and body thoroughly to remove the  shampoo.                           4.  Use CHG as you would any other liquid soap.  You can apply chg directly  to the skin and wash                       Gently with a scrungie or clean washcloth.  5.  Apply the CHG Soap to your body ONLY FROM THE NECK DOWN.   Do not use on face/ open                           Wound or open sores. Avoid contact  with eyes, ears mouth and genitals (private parts).                       Wash face,  Genitals (private parts) with your normal soap.             6.  Wash thoroughly, paying special attention to the area where your surgery  will be performed.  7.  Thoroughly rinse your body with warm water from the neck down.  8.  DO NOT shower/wash with your normal soap after using and rinsing off  the CHG Soap.                9.  Pat yourself dry with a clean towel.            10.  Wear clean pajamas.            11.  Place clean sheets on your bed the night of your first shower and do not  sleep with pets. Day of Surgery : Do not apply any lotions/deodorants the morning of surgery.  Please wear clean clothes to the hospital/surgery  center.  FAILURE TO FOLLOW THESE INSTRUCTIONS MAY RESULT IN THE CANCELLATION OF YOUR SURGERY PATIENT SIGNATURE_________________________________  NURSE SIGNATURE__________________________________  ________________________________________________________________________

## 2016-07-27 ENCOUNTER — Encounter (HOSPITAL_COMMUNITY): Payer: Self-pay

## 2016-07-27 ENCOUNTER — Encounter (HOSPITAL_COMMUNITY)
Admission: RE | Admit: 2016-07-27 | Discharge: 2016-07-27 | Disposition: A | Discharge status: Home / Self Care | Payer: PPO | Source: Ambulatory Visit | Attending: Surgery | Admitting: Surgery

## 2016-07-27 DIAGNOSIS — N401 Enlarged prostate with lower urinary tract symptoms: Secondary | ICD-10-CM | POA: Diagnosis not present

## 2016-07-27 DIAGNOSIS — C20 Malignant neoplasm of rectum: Secondary | ICD-10-CM | POA: Diagnosis not present

## 2016-07-27 DIAGNOSIS — E785 Hyperlipidemia, unspecified: Secondary | ICD-10-CM | POA: Diagnosis not present

## 2016-07-27 DIAGNOSIS — F329 Major depressive disorder, single episode, unspecified: Secondary | ICD-10-CM | POA: Diagnosis not present

## 2016-07-27 DIAGNOSIS — K621 Rectal polyp: Secondary | ICD-10-CM | POA: Diagnosis not present

## 2016-07-27 DIAGNOSIS — Z79899 Other long term (current) drug therapy: Secondary | ICD-10-CM | POA: Diagnosis not present

## 2016-07-27 DIAGNOSIS — R350 Frequency of micturition: Secondary | ICD-10-CM | POA: Diagnosis not present

## 2016-07-27 DIAGNOSIS — G4730 Sleep apnea, unspecified: Secondary | ICD-10-CM | POA: Diagnosis not present

## 2016-07-27 DIAGNOSIS — Z96651 Presence of right artificial knee joint: Secondary | ICD-10-CM | POA: Diagnosis not present

## 2016-07-27 DIAGNOSIS — Z6835 Body mass index (BMI) 35.0-35.9, adult: Secondary | ICD-10-CM | POA: Diagnosis not present

## 2016-07-27 DIAGNOSIS — E669 Obesity, unspecified: Secondary | ICD-10-CM | POA: Diagnosis not present

## 2016-07-27 DIAGNOSIS — R3915 Urgency of urination: Secondary | ICD-10-CM | POA: Diagnosis not present

## 2016-07-27 DIAGNOSIS — F411 Generalized anxiety disorder: Secondary | ICD-10-CM | POA: Diagnosis not present

## 2016-07-27 DIAGNOSIS — I10 Essential (primary) hypertension: Secondary | ICD-10-CM | POA: Diagnosis not present

## 2016-07-27 LAB — CBC
HEMATOCRIT: 38.7 % — AB (ref 39.0–52.0)
HEMOGLOBIN: 13.2 g/dL (ref 13.0–17.0)
MCH: 30.1 pg (ref 26.0–34.0)
MCHC: 34.1 g/dL (ref 30.0–36.0)
MCV: 88.4 fL (ref 78.0–100.0)
Platelets: 240 10*3/uL (ref 150–400)
RBC: 4.38 MIL/uL (ref 4.22–5.81)
RDW: 12.8 % (ref 11.5–15.5)
WBC: 7.2 10*3/uL (ref 4.0–10.5)

## 2016-07-27 LAB — BASIC METABOLIC PANEL
ANION GAP: 6 (ref 5–15)
BUN: 12 mg/dL (ref 6–20)
CALCIUM: 9 mg/dL (ref 8.9–10.3)
CO2: 25 mmol/L (ref 22–32)
Chloride: 107 mmol/L (ref 101–111)
Creatinine, Ser: 0.59 mg/dL — ABNORMAL LOW (ref 0.61–1.24)
GFR calc Af Amer: >60 mL/min (ref >60)
GLUCOSE: 92 mg/dL (ref 65–99)
POTASSIUM: 4 mmol/L (ref 3.5–5.1)
SODIUM: 138 mmol/L (ref 135–145)

## 2016-07-28 LAB — HEMOGLOBIN A1C
HEMOGLOBIN A1C: 5.6 % (ref 4.8–5.6)
Mean Plasma Glucose: 114 mg/dL

## 2016-07-29 MED ORDER — GENTAMICIN SULFATE 40 MG/ML IJ SOLN
5.0000 mg/kg | INTRAVENOUS | Status: AC
  Administered 2016-07-30: 430 mg via INTRAVENOUS
  Filled 2016-07-29: qty 10.75

## 2016-07-29 MED ORDER — CLINDAMYCIN PHOSPHATE 900 MG/50ML IV SOLN
900.0000 mg | INTRAVENOUS | Status: AC
  Administered 2016-07-30: 900 mg via INTRAVENOUS

## 2016-07-30 ENCOUNTER — Encounter (HOSPITAL_COMMUNITY): Payer: Self-pay

## 2016-07-30 ENCOUNTER — Inpatient Hospital Stay (HOSPITAL_COMMUNITY)
Admission: RE | Admit: 2016-07-30 | Discharge: 2016-07-31 | DRG: 376 | Disposition: A | Discharge status: Home / Self Care | Payer: PPO | Source: Ambulatory Visit | Attending: Surgery | Admitting: Surgery

## 2016-07-30 ENCOUNTER — Inpatient Hospital Stay (HOSPITAL_COMMUNITY): Payer: PPO | Admitting: Anesthesiology

## 2016-07-30 ENCOUNTER — Encounter (HOSPITAL_COMMUNITY)
Admission: RE | Disposition: A | Discharge status: Home / Self Care | Payer: Self-pay | Source: Ambulatory Visit | Attending: Surgery

## 2016-07-30 DIAGNOSIS — K621 Rectal polyp: Secondary | ICD-10-CM | POA: Diagnosis not present

## 2016-07-30 DIAGNOSIS — F411 Generalized anxiety disorder: Secondary | ICD-10-CM | POA: Diagnosis not present

## 2016-07-30 DIAGNOSIS — R3915 Urgency of urination: Secondary | ICD-10-CM | POA: Diagnosis present

## 2016-07-30 DIAGNOSIS — F329 Major depressive disorder, single episode, unspecified: Secondary | ICD-10-CM | POA: Diagnosis not present

## 2016-07-30 DIAGNOSIS — I10 Essential (primary) hypertension: Secondary | ICD-10-CM | POA: Diagnosis present

## 2016-07-30 DIAGNOSIS — C801 Malignant (primary) neoplasm, unspecified: Secondary | ICD-10-CM

## 2016-07-30 DIAGNOSIS — R350 Frequency of micturition: Secondary | ICD-10-CM | POA: Diagnosis present

## 2016-07-30 DIAGNOSIS — Z96651 Presence of right artificial knee joint: Secondary | ICD-10-CM | POA: Diagnosis present

## 2016-07-30 DIAGNOSIS — G4730 Sleep apnea, unspecified: Secondary | ICD-10-CM | POA: Diagnosis present

## 2016-07-30 DIAGNOSIS — E785 Hyperlipidemia, unspecified: Secondary | ICD-10-CM | POA: Diagnosis present

## 2016-07-30 DIAGNOSIS — N401 Enlarged prostate with lower urinary tract symptoms: Secondary | ICD-10-CM | POA: Diagnosis not present

## 2016-07-30 DIAGNOSIS — Z6835 Body mass index (BMI) 35.0-35.9, adult: Secondary | ICD-10-CM

## 2016-07-30 DIAGNOSIS — E669 Obesity, unspecified: Secondary | ICD-10-CM

## 2016-07-30 DIAGNOSIS — Z79899 Other long term (current) drug therapy: Secondary | ICD-10-CM | POA: Diagnosis not present

## 2016-07-30 DIAGNOSIS — C20 Malignant neoplasm of rectum: Secondary | ICD-10-CM | POA: Diagnosis not present

## 2016-07-30 DIAGNOSIS — N400 Enlarged prostate without lower urinary tract symptoms: Secondary | ICD-10-CM | POA: Diagnosis present

## 2016-07-30 DIAGNOSIS — F429 Obsessive-compulsive disorder, unspecified: Secondary | ICD-10-CM | POA: Diagnosis present

## 2016-07-30 HISTORY — DX: Panic disorder (episodic paroxysmal anxiety): F41.0

## 2016-07-30 HISTORY — PX: PARTIAL PROCTECTOMY BY TEM: SHX6011

## 2016-07-30 HISTORY — DX: Major depressive disorder, recurrent severe without psychotic features: F33.2

## 2016-07-30 SURGERY — PARTIAL PROCTECTOMY BY TEM
Anesthesia: General

## 2016-07-30 MED ORDER — DIBUCAINE 1 % RE OINT
TOPICAL_OINTMENT | RECTAL | Status: AC
  Filled 2016-07-30: qty 28

## 2016-07-30 MED ORDER — TAMSULOSIN HCL 0.4 MG PO CAPS
0.4000 mg | ORAL_CAPSULE | Freq: Every day | ORAL | Status: DC
  Administered 2016-07-31: 0.4 mg via ORAL
  Filled 2016-07-30: qty 1

## 2016-07-30 MED ORDER — ACETAMINOPHEN 500 MG PO TABS
1000.0000 mg | ORAL_TABLET | Freq: Three times a day (TID) | ORAL | Status: DC
  Administered 2016-07-30 – 2016-07-31 (×3): 1000 mg via ORAL
  Filled 2016-07-30 (×3): qty 2

## 2016-07-30 MED ORDER — ROCURONIUM BROMIDE 100 MG/10ML IV SOLN
INTRAVENOUS | Status: AC
  Filled 2016-07-30: qty 1

## 2016-07-30 MED ORDER — SACCHAROMYCES BOULARDII 250 MG PO CAPS
250.0000 mg | ORAL_CAPSULE | Freq: Two times a day (BID) | ORAL | Status: DC
  Administered 2016-07-30 – 2016-07-31 (×2): 250 mg via ORAL
  Filled 2016-07-30 (×2): qty 1

## 2016-07-30 MED ORDER — LACTATED RINGERS IV SOLN: INTRAVENOUS | Status: DC

## 2016-07-30 MED ORDER — NEOMYCIN SULFATE 500 MG PO TABS: 1000.0000 mg | ORAL_TABLET | ORAL | Status: DC

## 2016-07-30 MED ORDER — MIDAZOLAM HCL 2 MG/2ML IJ SOLN
INTRAMUSCULAR | Status: AC
  Filled 2016-07-30: qty 2

## 2016-07-30 MED ORDER — ENSURE ENLIVE PO LIQD: 237.0000 mL | Freq: Two times a day (BID) | ORAL | Status: DC

## 2016-07-30 MED ORDER — MEPERIDINE HCL 50 MG/ML IJ SOLN
6.2500 mg | INTRAMUSCULAR | Status: DC | PRN
  Administered 2016-07-30: 12.5 mg via INTRAVENOUS

## 2016-07-30 MED ORDER — EPHEDRINE SULFATE 50 MG/ML IJ SOLN
INTRAMUSCULAR | Status: AC
  Filled 2016-07-30: qty 1

## 2016-07-30 MED ORDER — SODIUM CHLORIDE 0.9 % IJ SOLN
INTRAMUSCULAR | Status: AC
  Filled 2016-07-30: qty 10

## 2016-07-30 MED ORDER — PHENYLEPHRINE HCL 10 MG/ML IJ SOLN
INTRAMUSCULAR | Status: DC | PRN
  Administered 2016-07-30: 40 ug via INTRAVENOUS

## 2016-07-30 MED ORDER — ONDANSETRON HCL 4 MG/2ML IJ SOLN
INTRAMUSCULAR | Status: DC | PRN
  Administered 2016-07-30: 4 mg via INTRAVENOUS

## 2016-07-30 MED ORDER — BUPROPION HCL ER (XL) 300 MG PO TB24
300.0000 mg | ORAL_TABLET | Freq: Every day | ORAL | Status: DC
  Administered 2016-07-31: 300 mg via ORAL
  Filled 2016-07-30: qty 1

## 2016-07-30 MED ORDER — FENTANYL CITRATE (PF) 100 MCG/2ML IJ SOLN
INTRAMUSCULAR | Status: DC | PRN
  Administered 2016-07-30: 100 ug via INTRAVENOUS
  Administered 2016-07-30 (×3): 50 ug via INTRAVENOUS

## 2016-07-30 MED ORDER — TRAZODONE HCL 100 MG PO TABS
200.0000 mg | ORAL_TABLET | Freq: Every day | ORAL | Status: DC
  Administered 2016-07-30: 200 mg via ORAL
  Filled 2016-07-30: qty 2

## 2016-07-30 MED ORDER — VITAMIN B-6 100 MG PO TABS
100.0000 mg | ORAL_TABLET | Freq: Every day | ORAL | Status: DC
  Administered 2016-07-31: 100 mg via ORAL
  Filled 2016-07-30 (×2): qty 1

## 2016-07-30 MED ORDER — HYDROMORPHONE HCL 1 MG/ML IJ SOLN
0.5000 mg | INTRAMUSCULAR | Status: DC | PRN
  Administered 2016-07-30: 0.5 mg via INTRAVENOUS
  Filled 2016-07-30: qty 1

## 2016-07-30 MED ORDER — ACETAMINOPHEN 500 MG PO TABS
1000.0000 mg | ORAL_TABLET | ORAL | Status: AC
  Administered 2016-07-30: 1000 mg via ORAL
  Filled 2016-07-30: qty 2

## 2016-07-30 MED ORDER — HYDROMORPHONE HCL 1 MG/ML IJ SOLN
0.2500 mg | INTRAMUSCULAR | Status: DC | PRN
  Administered 2016-07-30: 0.25 mg via INTRAVENOUS

## 2016-07-30 MED ORDER — BISACODYL 5 MG PO TBEC: 20.0000 mg | DELAYED_RELEASE_TABLET | Freq: Once | ORAL | Status: DC

## 2016-07-30 MED ORDER — FENTANYL CITRATE (PF) 250 MCG/5ML IJ SOLN
INTRAMUSCULAR | Status: AC
  Filled 2016-07-30: qty 5

## 2016-07-30 MED ORDER — SUGAMMADEX SODIUM 200 MG/2ML IV SOLN
INTRAVENOUS | Status: DC | PRN
  Administered 2016-07-30: 200 mg via INTRAVENOUS

## 2016-07-30 MED ORDER — ENOXAPARIN SODIUM 40 MG/0.4ML ~~LOC~~ SOLN
40.0000 mg | SUBCUTANEOUS | Status: DC
  Administered 2016-07-31: 40 mg via SUBCUTANEOUS
  Filled 2016-07-30: qty 0.4

## 2016-07-30 MED ORDER — LORAZEPAM 2 MG/ML IJ SOLN
0.5000 mg | Freq: Three times a day (TID) | INTRAMUSCULAR | Status: DC | PRN
  Administered 2016-07-30 – 2016-07-31 (×2): 0.5 mg via INTRAVENOUS
  Filled 2016-07-30 (×2): qty 1

## 2016-07-30 MED ORDER — CLINDAMYCIN PHOSPHATE 900 MG/50ML IV SOLN
900.0000 mg | Freq: Three times a day (TID) | INTRAVENOUS | Status: AC
  Administered 2016-07-30: 900 mg via INTRAVENOUS
  Filled 2016-07-30: qty 50

## 2016-07-30 MED ORDER — MENTHOL 3 MG MT LOZG: 1.0000 | LOZENGE | OROMUCOSAL | Status: DC | PRN

## 2016-07-30 MED ORDER — LACTATED RINGERS IR SOLN
Status: DC | PRN
  Administered 2016-07-30: 1000 mL

## 2016-07-30 MED ORDER — POLYETHYLENE GLYCOL 3350 17 GM/SCOOP PO POWD: 1.0000 | Freq: Once | ORAL | Status: DC

## 2016-07-30 MED ORDER — POLYETHYLENE GLYCOL 3350 17 G PO PACK
17.0000 g | PACK | Freq: Every day | ORAL | Status: DC
  Administered 2016-07-31: 17 g via ORAL
  Filled 2016-07-30: qty 1

## 2016-07-30 MED ORDER — METOPROLOL TARTRATE 5 MG/5ML IV SOLN: 5.0000 mg | Freq: Four times a day (QID) | INTRAVENOUS | Status: DC | PRN

## 2016-07-30 MED ORDER — PROCHLORPERAZINE EDISYLATE 5 MG/ML IJ SOLN: 10.0000 mg | Freq: Four times a day (QID) | INTRAMUSCULAR | Status: DC | PRN

## 2016-07-30 MED ORDER — LIDOCAINE HCL (CARDIAC) 20 MG/ML IV SOLN
INTRAVENOUS | Status: DC | PRN
  Administered 2016-07-30: 50 mg via INTRAVENOUS

## 2016-07-30 MED ORDER — LACTATED RINGERS IV SOLN
INTRAVENOUS | Status: DC | PRN
  Administered 2016-07-30 (×2): via INTRAVENOUS

## 2016-07-30 MED ORDER — HYDROMORPHONE HCL 1 MG/ML IJ SOLN
INTRAMUSCULAR | Status: AC
  Filled 2016-07-30: qty 1

## 2016-07-30 MED ORDER — GABAPENTIN 300 MG PO CAPS
300.0000 mg | ORAL_CAPSULE | ORAL | Status: AC
  Administered 2016-07-30: 300 mg via ORAL
  Filled 2016-07-30: qty 1

## 2016-07-30 MED ORDER — DIPHENHYDRAMINE HCL 12.5 MG/5ML PO ELIX: 12.5000 mg | ORAL_SOLUTION | Freq: Four times a day (QID) | ORAL | Status: DC | PRN

## 2016-07-30 MED ORDER — ALVIMOPAN 12 MG PO CAPS: 12.0000 mg | ORAL_CAPSULE | Freq: Two times a day (BID) | ORAL | Status: DC

## 2016-07-30 MED ORDER — ONDANSETRON HCL 4 MG/2ML IJ SOLN: 4.0000 mg | Freq: Once | INTRAMUSCULAR | Status: DC | PRN

## 2016-07-30 MED ORDER — DIPHENHYDRAMINE HCL 50 MG/ML IJ SOLN: 12.5000 mg | Freq: Four times a day (QID) | INTRAMUSCULAR | Status: DC | PRN

## 2016-07-30 MED ORDER — SUGAMMADEX SODIUM 200 MG/2ML IV SOLN
INTRAVENOUS | Status: AC
  Filled 2016-07-30: qty 2

## 2016-07-30 MED ORDER — OXYCODONE HCL 5 MG PO TABS
5.0000 mg | ORAL_TABLET | ORAL | Status: DC | PRN
  Administered 2016-07-31: 5 mg via ORAL
  Filled 2016-07-30: qty 1

## 2016-07-30 MED ORDER — SODIUM CHLORIDE 0.9 % IJ SOLN
INTRAMUSCULAR | Status: AC
  Filled 2016-07-30: qty 50

## 2016-07-30 MED ORDER — VITAMIN B-12 100 MCG PO TABS
100.0000 ug | ORAL_TABLET | Freq: Every day | ORAL | Status: DC
  Administered 2016-07-31: 100 ug via ORAL
  Filled 2016-07-30 (×2): qty 1

## 2016-07-30 MED ORDER — PHENOL 1.4 % MT LIQD: 2.0000 | OROMUCOSAL | Status: DC | PRN

## 2016-07-30 MED ORDER — ONDANSETRON HCL 4 MG/2ML IJ SOLN
INTRAMUSCULAR | Status: AC
  Filled 2016-07-30: qty 2

## 2016-07-30 MED ORDER — MEPERIDINE HCL 50 MG/ML IJ SOLN
INTRAMUSCULAR | Status: AC
  Filled 2016-07-30: qty 1

## 2016-07-30 MED ORDER — DOXAZOSIN MESYLATE 4 MG PO TABS
4.0000 mg | ORAL_TABLET | Freq: Every evening | ORAL | Status: DC
  Administered 2016-07-30: 4 mg via ORAL
  Filled 2016-07-30 (×2): qty 1

## 2016-07-30 MED ORDER — LIP MEDEX EX OINT
1.0000 "application " | TOPICAL_OINTMENT | Freq: Two times a day (BID) | CUTANEOUS | Status: DC
  Administered 2016-07-30 – 2016-07-31 (×2): 1 via TOPICAL
  Filled 2016-07-30: qty 7

## 2016-07-30 MED ORDER — LACTATED RINGERS IV BOLUS (SEPSIS)
1000.0000 mL | Freq: Three times a day (TID) | INTRAVENOUS | Status: DC | PRN
  Administered 2016-07-30: 1000 mL via INTRAVENOUS
  Filled 2016-07-30: qty 1000

## 2016-07-30 MED ORDER — BUPIVACAINE-EPINEPHRINE 0.25% -1:200000 IJ SOLN
INTRAMUSCULAR | Status: AC
  Filled 2016-07-30: qty 1

## 2016-07-30 MED ORDER — RISPERIDONE 0.5 MG PO TABS
0.5000 mg | ORAL_TABLET | Freq: Every day | ORAL | Status: DC
  Administered 2016-07-30: 0.5 mg via ORAL
  Filled 2016-07-30: qty 1

## 2016-07-30 MED ORDER — LIDOCAINE HCL (CARDIAC) 20 MG/ML IV SOLN
INTRAVENOUS | Status: AC
  Filled 2016-07-30: qty 5

## 2016-07-30 MED ORDER — PROPOFOL 10 MG/ML IV BOLUS
INTRAVENOUS | Status: DC | PRN
  Administered 2016-07-30: 120 mg via INTRAVENOUS

## 2016-07-30 MED ORDER — ENOXAPARIN SODIUM 40 MG/0.4ML ~~LOC~~ SOLN
40.0000 mg | Freq: Once | SUBCUTANEOUS | Status: AC
  Administered 2016-07-30: 40 mg via SUBCUTANEOUS
  Filled 2016-07-30: qty 0.4

## 2016-07-30 MED ORDER — PROPOFOL 10 MG/ML IV BOLUS
INTRAVENOUS | Status: AC
  Filled 2016-07-30: qty 20

## 2016-07-30 MED ORDER — TRAMADOL HCL 50 MG PO TABS: 50.0000 mg | ORAL_TABLET | Freq: Four times a day (QID) | ORAL | 0 refills | Status: DC | PRN

## 2016-07-30 MED ORDER — MAGIC MOUTHWASH
15.0000 mL | Freq: Four times a day (QID) | ORAL | Status: DC | PRN
  Filled 2016-07-30: qty 15

## 2016-07-30 MED ORDER — 0.9 % SODIUM CHLORIDE (POUR BTL) OPTIME
TOPICAL | Status: DC | PRN
  Administered 2016-07-30: 1000 mL

## 2016-07-30 MED ORDER — CYCLOSPORINE 0.05 % OP EMUL
1.0000 [drp] | Freq: Two times a day (BID) | OPHTHALMIC | Status: DC
  Administered 2016-07-30 – 2016-07-31 (×2): 1 [drp] via OPHTHALMIC
  Filled 2016-07-30 (×3): qty 1

## 2016-07-30 MED ORDER — METRONIDAZOLE 500 MG PO TABS: 1000.0000 mg | ORAL_TABLET | ORAL | Status: DC

## 2016-07-30 MED ORDER — CLINDAMYCIN PHOSPHATE 900 MG/50ML IV SOLN
INTRAVENOUS | Status: AC
  Filled 2016-07-30: qty 50

## 2016-07-30 MED ORDER — LORAZEPAM 2 MG/ML IJ SOLN: 0.0500 mg | Freq: Three times a day (TID) | INTRAMUSCULAR | Status: DC | PRN

## 2016-07-30 MED ORDER — ALUM & MAG HYDROXIDE-SIMETH 200-200-20 MG/5ML PO SUSP: 30.0000 mL | Freq: Four times a day (QID) | ORAL | Status: DC | PRN

## 2016-07-30 MED ORDER — STERILE WATER FOR IRRIGATION IR SOLN
Status: DC | PRN
  Administered 2016-07-30: 1500 mL

## 2016-07-30 MED ORDER — PHENYLEPHRINE 40 MCG/ML (10ML) SYRINGE FOR IV PUSH (FOR BLOOD PRESSURE SUPPORT)
PREFILLED_SYRINGE | INTRAVENOUS | Status: AC
  Filled 2016-07-30: qty 10

## 2016-07-30 MED ORDER — ROCURONIUM BROMIDE 100 MG/10ML IV SOLN
INTRAVENOUS | Status: DC | PRN
  Administered 2016-07-30: 20 mg via INTRAVENOUS
  Administered 2016-07-30 (×3): 10 mg via INTRAVENOUS
  Administered 2016-07-30: 50 mg via INTRAVENOUS

## 2016-07-30 SURGICAL SUPPLY — 55 items
BLADE SURG 15 STRL LF DISP TIS (BLADE) IMPLANT
BLADE SURG 15 STRL SS (BLADE)
BLADE SURG SZ12 CARB STEEL (BLADE) ×2 IMPLANT
BRIEF STRETCH FOR OB PAD LRG (UNDERPADS AND DIAPERS) IMPLANT
CABLE HIGH FREQUENCY MONO STRZ (ELECTRODE) ×2 IMPLANT
COVER SURGICAL LIGHT HANDLE (MISCELLANEOUS) IMPLANT
DRAPE LAPAROTOMY T 102X78X121 (DRAPES) ×2 IMPLANT
DRAPE LG THREE QUARTER DISP (DRAPES) ×2 IMPLANT
DRAPE WARM FLUID 44X44 (DRAPE) ×2 IMPLANT
DRSG PAD ABDOMINAL 8X10 ST (GAUZE/BANDAGES/DRESSINGS) IMPLANT
DRSG TEGADERM 2-3/8X2-3/4 SM (GAUZE/BANDAGES/DRESSINGS) ×2 IMPLANT
ELECT PENCIL ROCKER SW 15FT (MISCELLANEOUS) IMPLANT
ELECT REM PT RETURN 9FT ADLT (ELECTROSURGICAL) ×2
ELECTRODE REM PT RTRN 9FT ADLT (ELECTROSURGICAL) ×1 IMPLANT
GAUZE SPONGE 2X2 8PLY STRL LF (GAUZE/BANDAGES/DRESSINGS) ×1 IMPLANT
GAUZE SPONGE 4X4 12PLY STRL (GAUZE/BANDAGES/DRESSINGS) ×2 IMPLANT
GAUZE SPONGE 4X4 16PLY XRAY LF (GAUZE/BANDAGES/DRESSINGS) ×2 IMPLANT
GLOVE ECLIPSE 8.0 STRL XLNG CF (GLOVE) ×4 IMPLANT
GLOVE INDICATOR 8.0 STRL GRN (GLOVE) ×4 IMPLANT
GOWN STRL REUS W/TWL XL LVL3 (GOWN DISPOSABLE) ×4 IMPLANT
KIT BASIN OR (CUSTOM PROCEDURE TRAY) ×2 IMPLANT
LEGGING LITHOTOMY PAIR STRL (DRAPES) ×2 IMPLANT
LUBRICANT JELLY K Y 4OZ (MISCELLANEOUS) ×2 IMPLANT
NEEDLE HYPO 22GX1.5 SAFETY (NEEDLE) ×2 IMPLANT
NEEDLE INSUFFLATION 14GA 120MM (NEEDLE) ×2 IMPLANT
PACK BASIC VI WITH GOWN DISP (CUSTOM PROCEDURE TRAY) ×2 IMPLANT
RETRACTOR LONE STAR DISPOSABLE (INSTRUMENTS) IMPLANT
RETRACTOR STAY HOOK 5MM (MISCELLANEOUS) IMPLANT
SCISSORS LAP 5X35 DISP (ENDOMECHANICALS) ×2 IMPLANT
SHEARS HARMONIC ACE PLUS 36CM (ENDOMECHANICALS) ×2 IMPLANT
SPONGE GAUZE 2X2 STER 10/PKG (GAUZE/BANDAGES/DRESSINGS) ×1
STOPCOCK 4 WAY LG BORE MALE ST (IV SETS) IMPLANT
SUT CHROMIC 3 0 SH 27 (SUTURE) IMPLANT
SUT PDS AB 2-0 CT2 27 (SUTURE) IMPLANT
SUT PDS AB 3-0 SH 27 (SUTURE) IMPLANT
SUT SILK 2 0 (SUTURE)
SUT SILK 2 0 SH CR/8 (SUTURE) IMPLANT
SUT SILK 2-0 18XBRD TIE 12 (SUTURE) IMPLANT
SUT SILK 3 0 SH 30 (SUTURE) IMPLANT
SUT SILK 3 0 SH CR/8 (SUTURE) IMPLANT
SUT V-LOC BARB 180 2/0GR6 GS22 (SUTURE) ×6
SUT VIC AB 2-0 UR6 27 (SUTURE) IMPLANT
SUT VIC AB 3-0 SH 27 (SUTURE)
SUT VIC AB 3-0 SH 27XBRD (SUTURE) IMPLANT
SUTURE V-LC BRB 180 2/0GR6GS22 (SUTURE) ×3 IMPLANT
SYR 20CC LL (SYRINGE) ×2 IMPLANT
SYR BULB IRRIGATION 50ML (SYRINGE) IMPLANT
TOWEL OR 17X26 10 PK STRL BLUE (TOWEL DISPOSABLE) ×2 IMPLANT
TOWEL OR NON WOVEN STRL DISP B (DISPOSABLE) ×2 IMPLANT
TRAY FOLEY W/METER SILVER 14FR (SET/KITS/TRAYS/PACK) ×2 IMPLANT
TRAY FOLEY W/METER SILVER 16FR (SET/KITS/TRAYS/PACK) ×2 IMPLANT
TROCAR BLADELESS OPT 5 100 (ENDOMECHANICALS) ×2 IMPLANT
TUBING CONNECTING 10 (TUBING) IMPLANT
TUBING INSUF HEATED (TUBING) ×2 IMPLANT
YANKAUER SUCT BULB TIP 10FT TU (MISCELLANEOUS) IMPLANT

## 2016-07-30 NOTE — Anesthesia Postprocedure Evaluation (Signed)
Anesthesia Post Note  Patient: Brad Miller  Procedure(s) Performed: Procedure(s) (LRB): PARTIAL PROCTECTOMY BY TEM WITH EXAM UNDER ANESTHESIA (N/A)  Patient location during evaluation: PACU Anesthesia Type: General Level of consciousness: awake and alert Pain management: pain level controlled Vital Signs Assessment: post-procedure vital signs reviewed and stable Respiratory status: spontaneous breathing, nonlabored ventilation, respiratory function stable and patient connected to nasal cannula oxygen Cardiovascular status: blood pressure returned to baseline and stable Postop Assessment: no signs of nausea or vomiting Anesthetic complications: no    Last Vitals:  Vitals:   07/30/16 1144 07/30/16 1248  BP: 130/68 (!) 120/56  Pulse: 72 70  Resp: 12 14  Temp: 36.6 C 36.6 C    Last Pain:  Vitals:   07/30/16 1248  TempSrc: Oral  PainSc:                  Srihith Aquilino DAVID

## 2016-07-30 NOTE — Anesthesia Procedure Notes (Signed)
Procedure Name: Intubation Date/Time: 07/30/2016 7:49 AM Performed by: Glory Buff Pre-anesthesia Checklist: Patient identified, Emergency Drugs available, Suction available and Patient being monitored Patient Re-evaluated:Patient Re-evaluated prior to inductionOxygen Delivery Method: Circle system utilized Preoxygenation: Pre-oxygenation with 100% oxygen Intubation Type: IV induction Ventilation: Mask ventilation without difficulty Laryngoscope Size: Miller and 3 Grade View: Grade I Tube type: Oral Tube size: 7.5 mm Number of attempts: 1 Airway Equipment and Method: Stylet and Oral airway Placement Confirmation: ETT inserted through vocal cords under direct vision,  positive ETCO2 and breath sounds checked- equal and bilateral Secured at: 21 cm Tube secured with: Tape Dental Injury: Teeth and Oropharynx as per pre-operative assessment

## 2016-07-30 NOTE — Anesthesia Preprocedure Evaluation (Addendum)
Anesthesia Evaluation  Patient identified by MRN, date of birth, ID band Patient awake    Reviewed: Allergy & Precautions, NPO status , Patient's Chart, lab work & pertinent test results  Airway Mallampati: I   Neck ROM: Full    Dental   Pulmonary sleep apnea ,    Pulmonary exam normal        Cardiovascular hypertension, Pt. on medications Normal cardiovascular exam     Neuro/Psych    GI/Hepatic   Endo/Other    Renal/GU      Musculoskeletal   Abdominal   Peds  Hematology   Anesthesia Other Findings   Reproductive/Obstetrics                             Anesthesia Physical Anesthesia Plan  ASA: II  Anesthesia Plan: General   Post-op Pain Management:    Induction: Intravenous  Airway Management Planned: Oral ETT  Additional Equipment:   Intra-op Plan:   Post-operative Plan: Extubation in OR  Informed Consent: I have reviewed the patients History and Physical, chart, labs and discussed the procedure including the risks, benefits and alternatives for the proposed anesthesia with the patient or authorized representative who has indicated his/her understanding and acceptance.     Plan Discussed with: CRNA and Surgeon  Anesthesia Plan Comments:         Anesthesia Quick Evaluation

## 2016-07-30 NOTE — Transfer of Care (Signed)
Immediate Anesthesia Transfer of Care Note  Patient: Brad Miller  Procedure(s) Performed: Procedure(s): PARTIAL PROCTECTOMY BY TEM WITH EXAM UNDER ANESTHESIA (N/A)  Patient Location: PACU  Anesthesia Type:General  Level of Consciousness: awake, alert  and oriented  Airway & Oxygen Therapy: Patient Spontanous Breathing and Patient connected to face mask oxygen  Post-op Assessment: Report given to RN and Post -op Vital signs reviewed and stable  Post vital signs: Reviewed and stable  Last Vitals:  Vitals:   07/30/16 0530  BP: 123/70  Pulse: 87  Resp: 18  Temp: 36.6 C    Last Pain:  Vitals:   07/30/16 0549  TempSrc:   PainSc: 5          Complications: No apparent anesthesia complications

## 2016-07-30 NOTE — Interval H&P Note (Signed)
History and Physical Interval Note:  07/30/2016 7:46 AM  Brad Miller  has presented today for surgery, with the diagnosis of Rectal cancer in a polyp s/p polypectomy  The various methods of treatment have been discussed with the patient and family. After consideration of risks, benefits and other options for treatment, the patient has consented to  Procedure(s): PARTIAL PROCTECTOMY BY TEM WITH EXAM UNDER ANESTHESIA (N/A) as a surgical intervention .  The patient's history has been reviewed, patient examined, no change in status, stable for surgery.  I have reviewed the patient's chart and labs.  Questions were answered to the patient's satisfaction.     [name]

## 2016-07-30 NOTE — H&P (Signed)
Brad Miller 06/08/2016 3:42 PM Location: [facility] Patient #: [id number] DOB: 1951-07-13 Married / Language: English / Race: White Male   History of Present Illness Brad Miller; 06/08/2016 6:38 PM) The patient is a 65 year old male who presents with a colorectal polyp. Note for "Colorectal polyp": Patient sent for surgical consultation at the request of Dr. Paulita Fujita. Concern for rectal mass.  Patient comes in today with his wife. Underwent for screening colonoscopy. Some polyps removed. 2 cm polyp in proximal rectum removed in a piecemeal fashion. Apparently and had adenocarcinoma within it. He'll follow up biopsies that were negative. Endorectal ultrasound showed no deeper area. CT scan of chest and pelvis otherwise underwhelming. Surgical consultation recommended see if he needs more further resection.  Patient denies any pain. No rectal bleeding. He does have a fair amount of urinary urgency and frequency. He's never seen a urologist. Normal urinalysis checked. Usually moves his bowels once or twice a day. He can walk 20 minutes without difficulty. He does not smoke. Does have some anxiety and depression issues bed nothing recently.  No personal nor family history of inflammatory bowel disease, irritable bowel syndrome, allergy such as Celiac Sprue, dietary/dairy problems, colitis, ulcers nor gastritis. No recent sick contacts/gastroenteritis. No travel outside the country. No changes in diet. No dysphagia to solids or liquids. No significant heartburn or reflux. No hematochezia, hematemesis, coffee ground emesis. No evidence of prior gastric/peptic ulceration.   Other Problems Brad Miller, CMA; 06/08/2016 3:42 PM) Anxiety Disorder Rectal Cancer  Past Surgical History Brad Miller, CMA; 06/08/2016 3:42 PM) Cataract Surgery Bilateral. Knee Surgery Bilateral. Vasectomy  Allergies Brad Miller, CMA; 06/08/2016 3:44 PM) Ciprofloxacin  *CHEMICALS* Hydrocodone-Acetaminophen *ANALGESICS - OPIOID* Penicillin V *PENICILLINS* Simvastatin *CHEMICALS*  Medication History Brad Miller, CMA; 06/08/2016 3:46 PM) TraZODone HCl (100MG  Tablet, Oral) Active. KlonoPIN (1MG  Tablet, Oral) Active. MiraLax (Oral) Active. Tylenol (325MG  Tablet, Oral) Active. Ibuprofen (800MG  Tablet, Oral) Active. Potassium (99MG  Tablet, Oral) Active. Cyanocobalamin (1000MCG Tablet, Oral) Active. Cardura XL (8MG  Tablet ER 24HR, Oral) Active. Wellbutrin SR (150MG  Tablet ER, Oral two times daily) Active. Restasis (0.05% Emulsion, Ophthalmic) Active. Turmeric (500MG  Capsule, Oral) Active. RisperiDONE (0.25MG  Tablet, Oral) Active. Medications Reconciled  Social History Brad Miller, [place]; 06/08/2016 3:42 PM) Alcohol use Occasional alcohol use. Caffeine use Carbonated beverages, Coffee, Tea. No drug use Tobacco use Never smoker.  Family History Brad Miller, [place]; 06/08/2016 3:42 PM) Arthritis Mother. Depression Father.    Review of Systems Brad Miller CMA; 06/08/2016 3:42 PM) Skin Present- Dryness. Not Present- Change in Wart/Mole, Hives, Jaundice, New Lesions, Non-Healing Wounds, Rash and Ulcer. HEENT Present- Wears glasses/contact lenses. Not Present- Earache, Hearing Loss, Hoarseness, Nose Bleed, Oral Ulcers, Ringing in the Ears, Seasonal Allergies, Sinus Pain, Sore Throat, Visual Disturbances and Yellow Eyes. Respiratory Present- Snoring. Not Present- Bloody sputum, Chronic Cough, Difficulty Breathing and Wheezing. Breast Not Present- Breast Mass, Breast Pain, Nipple Discharge and Skin Changes. Gastrointestinal Present- Constipation. Not Present- Abdominal Pain, Bloating, Bloody Stool, Change in Bowel Habits, Chronic diarrhea, Difficulty Swallowing, Excessive gas, Gets full quickly at meals, Hemorrhoids, Indigestion, Nausea, Rectal Pain and Vomiting. Male Genitourinary Present- Frequency. Not Present- Blood in Urine, Change in  Urinary Stream, Impotence, Nocturia, Painful Urination, Urgency and Urine Leakage. Musculoskeletal Not Present- Back Pain, Joint Pain, Joint Stiffness, Muscle Pain, Muscle Weakness and Swelling of Extremities. Psychiatric Present- Anxiety and Depression. Not Present- Bipolar, Change in Sleep Pattern, Fearful and Frequent crying. Endocrine Not Present- Cold Intolerance, Excessive Hunger, Hair Changes, Heat Intolerance,  Hot flashes and New Diabetes. Hematology Not Present- Easy Bruising, Excessive bleeding, Gland problems, HIV and Persistent Infections.  Vitals Brad Miller CMA; 06/08/2016 3:47 PM) 06/08/2016 3:46 PM Weight: 241 lb Height: 69in Body Surface Area: 2.24 m Body Mass Index: 35.59 kg/m  Temp.: 46F(Temporal)  Pulse: 90 (Regular)  BP: 132/74 (Sitting, Left Arm, Standard)       Physical Exam Brad Miller; 06/08/2016 6:36 PM) General Mental Status-Alert. General Appearance-Not in acute distress, Not Sickly. Orientation-Oriented X3. Hydration-Well hydrated. Voice-Normal.  Integumentary Global Assessment Upon inspection and palpation of skin surfaces of the - Axillae: non-tender, no inflammation or ulceration, no drainage. and Distribution of scalp and body hair is normal. General Characteristics Temperature - normal warmth is noted.  Head and Neck Head-normocephalic, atraumatic with no lesions or palpable masses. Face Global Assessment - atraumatic, no absence of expression. Neck Global Assessment - no abnormal movements, no bruit auscultated on the right, no bruit auscultated on the left, no decreased range of motion, non-tender. Trachea-midline. Thyroid Gland Characteristics - non-tender.  Eye Eyeball - Left-Extraocular movements intact, No Nystagmus. Eyeball - Right-Extraocular movements intact, No Nystagmus. Cornea - Left-No Hazy. Cornea - Right-No Hazy. Sclera/Conjunctiva - Left-No scleral icterus, No  Discharge. Sclera/Conjunctiva - Right-No scleral icterus, No Discharge. Pupil - Left-Direct reaction to light normal. Pupil - Right-Direct reaction to light normal.  ENMT Ears Pinna - Left - no drainage observed, no generalized tenderness observed. Right - no drainage observed, no generalized tenderness observed. Nose and Sinuses External Inspection of the Nose - no destructive lesion observed. Inspection of the nares - Left - quiet respiration. Right - quiet respiration. Mouth and Throat Lips - Upper Lip - no fissures observed, no pallor noted. Lower Lip - no fissures observed, no pallor noted. Nasopharynx - no discharge present. Oral Cavity/Oropharynx - Tongue - no dryness observed. Oral Mucosa - no cyanosis observed. Hypopharynx - no evidence of airway distress observed.  Chest and Lung Exam Inspection Movements - Normal and Symmetrical. Accessory muscles - No use of accessory muscles in breathing. Palpation Palpation of the chest reveals - Non-tender. Auscultation Breath sounds - Normal and Clear.  Cardiovascular Auscultation Rhythm - Regular. Murmurs & Other Heart Sounds - Auscultation of the heart reveals - No Murmurs and No Systolic Clicks.  Abdomen Inspection Inspection of the abdomen reveals - No Visible peristalsis and No Abnormal pulsations. Umbilicus - No Bleeding, No Urine drainage. Palpation/Percussion Palpation and Percussion of the abdomen reveal - Soft, Non Tender, No Rebound tenderness, No Rigidity (guarding) and No Cutaneous hyperesthesia. Note: Obese but soft. Mild diastases. No umbilical hernia. Nondistended. Nontender   Male Genitourinary Sexual Maturity Tanner 5 - Adult hair pattern and Adult penile size and shape. Note: No inguinal hernias. Normal external genitalia. Epididymi, testes, and spermatic cords normal without any masses.   Rectal Note: Exam done with assistance of male Medical Assistant in the room. Perianal skin clean with good  hygiene. No pruritis ani. No pilonidal disease. No fissure. No abscess/fistula. Normal sphincter tone. Tolerates digital and anoscopic rectal exam. No rectal masses felt to 8cm. Prostate mildly enlarged but not nodular. No external hemorrhoids. No significant internal hemorrhoidal piles   Peripheral Vascular  Upper Extremity Inspection - Left - No Cyanotic nailbeds, Not Ischemic. Right - No Cyanotic nailbeds, Not Ischemic.  Neurologic Neurologic evaluation reveals -normal attention span and ability to concentrate, able to name objects and repeat phrases. Appropriate fund of knowledge , normal sensation and normal coordination. Mental Status Affect - not angry,  not paranoid. Cranial Nerves-Normal Bilaterally. Gait-Normal.  Neuropsychiatric Mental status exam performed with findings of-able to articulate well with normal speech/language, rate, volume and coherence, thought content normal with ability to perform basic computations and apply abstract reasoning and no evidence of hallucinations, delusions, obsessions or homicidal/suicidal ideation.  Musculoskeletal Global Assessment Spine, Ribs and Pelvis - no instability, subluxation or laxity. Right Upper Extremity - no instability, subluxation or laxity.  Lymphatic Head & Neck  General Head & Neck Lymphatics: Bilateral - Description - No Localized lymphadenopathy. Axillary  General Axillary Region: Bilateral - Description - No Localized lymphadenopathy. Femoral & Inguinal  Generalized Femoral & Inguinal Lymphatics: Left - Description - No Localized lymphadenopathy. Right - Description - No Localized lymphadenopathy.    Assessment & Plan  RECTAL ADENOCARCINOMA (C20) Impression: Patient with adenocarcinoma and a rectal polyp that was removed in a piecemeal fashion.  I think he would benefit from further resection. I need to get a copy his original pathology report. I think he would benefit from better margins. Consider TEM  resection at the least. If it is more high risk, consider proceeding with low anterior resection (hopefully not too likely).  He seems disappointed that he might need another surgery. Seem to be debating whether he wanted to proceed or not. His wife feels more strongly to proceed.  Once I get the complete paperwork, I'll be able to give him for formal opinion depending on what the initial pathology showed.   PREOP COLON - ENCOUNTER FOR PREOPERATIVE EXAMINATION FOR GENERAL SURGICAL PROCEDURE (Z01.818) Current Plans You are being scheduled for surgery - Our schedulers will call you.  You should hear from our office's scheduling department within 5 working days about the location, date, and time of surgery. We try to make accommodations for patient's preferences in scheduling surgery, but sometimes the OR schedule or the surgeon's schedule prevents [place] from making those accommodations.  If you have not heard from our office [phone]) in 5 working days, call the office and ask for your surgeon's nurse.  If you have other questions about your diagnosis, plan, or surgery, call the office and ask for your surgeon's nurse.  Written instructions provided Pt Education - [facility] TEM Education (Alexiya Franqui): discussed with patient and provided information. Pt Education - [facility] Colon Bowel Prep 2015 Miralax/Antibiotics Started Neomycin Sulfate 500MG , 2 (two) Tablet SEE NOTE, #6, 06/08/2016, No Refill. Local Order: TAKE TWO TABLETS AT 2 PM, 3 PM, AND 10 PM THE DAY PRIOR TO SURGERY Started Flagyl 500MG , 2 (two) Tablet SEE NOTE, #6, 06/08/2016, No Refill. Local Order: Take at 2pm, 3pm, and 10pm the day prior to your colon operation Pt Education - [place] (Tenley Winward)  URINARY FREQUENCY (R35.0) Impression: Increased urgency and frequency. Apparently normal UA and doctors primary care office.  I think he would benefit from urology evaluation. Wife strongly agrees. We'll see if we can set that up through  [facility] to help him out.  Brad Hector, M.D., F.A.C.S. Gastrointestinal and Minimally Invasive Surgery [facility] [address] [place], [place] [address] [phone] Main / Paging

## 2016-07-30 NOTE — Op Note (Addendum)
07/30/2016  10:26 AM  PATIENT:  Brad Miller  65 y.o. 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  INDICATION: Patient found them polyps on colonoscopy.  Any meter polyp proximal resected rectum resected.  Small focus of carcinoma found within it.  Pathology review suspects is superficial but hard to orient given removed in piecemeal.  Follow-up endorectal ultrasound showed no definite residual tumor.  CT scans otherwise negative.  Surgical consultation requested.  I discussed options.  I recommended more aggressive resection of tissue with at least TEM, noting that best survival option is low anterior resection.  Patient declined LAR.  I offered to do TEM resection for better margins to make sure there is no evidence of residual cancer and hopefully improve local control survival.  Wife strongly agreed.  Patient initially eventually agreed to proceed with this procedure  The anatomy & physiology of the digestive tract was discussed.  The pathophysiology of the rectal  pathology was discussed.  Natural history risks without surgery was discussed.   I feel the risks of no intervention will lead to serious problems that outweigh the operative risks; therefore, I recommended surgery.    Laparoscopic & open abdominal techniques were discussed.  I recommended we start with a partial proctectomy by transanal endoscopic microsurgery (TEM) for excisional biopsy to remove the pathology and hopefully cure and/or control the pathology.  This technique can offer less operative risk and faster post-operative recovery.  Possible need for immediate or later abdominal surgery for further treatment was discussed.   Risks such as bleeding, abscess, reoperation, ostomy, heart attack, death, and other risks were discussed.   I noted a good likelihood this will help address the problem.  Goals of post-operative recovery were discussed as well.  We will work to minimize complications.  An educational handout was given as well.  Questions were answered.  The patient expresses understanding & wishes to proceed with surgery.  OR FINDINGS: New and proximal rectum with submucosal nodule.  Right anterior rectal wall at around 11:00.  In focus of tattoo.  Some tattoo laterally as well but that area soft and not involved.  The resulting mass was 5x4 cm in size  Pin placement on pathology specimen: Proximal margin: pink Distal margin: green Right lateral margin: purple Left lateral margin: yelloe  The closure rests 8-10cm from the anal verge in the anterior 2/3 location  DESCRIPTION: Informed consent was confirmed.  Patient received general anesthesia without difficulty.  Foley catheter sterilely placed.  Sequential compression devices active during the entire case.  The patient was placed in the  lithotomy position, taking extra care to secure and protect the patient appropriately.  The perineum and perianal regions were prepped and draped in a sterile fashion.  Surgical timeout confirmed our  plan.  I did a gentle digital rectal examination with gradual anal dilation to allow placement of the 4 cm TEO Stortz scope.  This was secured to the bed using the TEO clamping system.  We induced carbon dioxide insufflation intraluminally.  I oriented the Stortz scope and the patient such that the specimen rested towards the floor at the 6:00 position.  DESCRIPTION: Informed consent was confirmed.  Patient received general anesthesia without difficulty.  Foley catheter sterilely placed.  Sequential compression devices active during the entire case.  The patient was placed in the prone position, taking extra care to secure and protect the patient appropriately.  The perineum and perianal regions were prepped and draped in a sterile fashion.  Surgical timeout confirmed our plan.  I did a gentle digital rectal examination with gradual anal dilation to allow placement of the 4 cm TEO Stortz scope.  This was secured to the bed using the TEO clamping system.  We induced carbon dioxide insufflation intraluminally.  I oriented the Stortz scope.  I could see tattooing on the anterior rectal wall and right anterior rectal wall.  A stellate scar was at around 11:00 in the right anterior position.  However it was very floppy rectosigmoid colon, so I continued to have the patient be an lithotomy position.    I went ahead and used tip point cautery to mark 1-1.5 cm margins circumferentially.  I then did a full thickness transection at the distal margin.  I came around laterally.  Switched over to harmonic dissection.  Lifted the specimen off the pelvic canal for a good deep margin.  I gradually came more proximally.  Transected at the proximal margin.  There was breech into the peritoneal cavity, consistent with this mass at the peritoneal reflection.  I placed a 66mm port in left upper quadrant sterilely to help decompress the peritoneum I ensured hemostasis.  I inspected the main specimen and pinned it on thick cork  board.  Pins as noted above.  I walked the specimen down to pathology and showed the [facility] pathology team for proper orientation.    I went back in and scrubbed in.  Hemostasis excellent.   reapproximated the wound with a 2-0 V-lock horizontal mattress stitch to bring the middle part of the rectum down to help close the middle of the wound.  I then ran that stitch towards the left anterior lateral edge.  I then closed the right anterior side of the wound using a another 2-0 V-lock serrated stitch in a running fashion, starting from the right anterior corner.  A third stitch going left anterior right anterior to have overlapping V lock stitches, especially having extra layer of closure on the rectum to better cover the peritoneal breech.  Got excellent bites..  This brought things together well.  I did meticulous inspection with fine tip instruments to confirm good watertight closure   there is moderate narrowing of the lumen but the posterior third was spared.  Hemostasis excellent.  The lumen was quite patent.  Carbon dioxide evacuated & instruments removed.  Peritoneal carbon dioxide evacuated.  LUQ 57mm port site closed with monocryl suture.  Sterile dressing applied.  DRE noted a narrowed but intact closure anterior 2/3 of rectal wall at tip of finger = ~8cm from anal verge, allowing my  index finger to pass through.  The patient is being extubated go to recovery room.  I discussed operative findings, updated the patient's status, discussed probable steps to recovery, and gave postoperative recommendations to the patient's spouse.  Recommendations were made.  Questions were answered.  She expressed understanding & appreciation.  [name], M.D., F.A.C.S. Gastrointestinal and Minimally Invasive Surgery [facility] [address], [place], [place] [address] [phone] Main / Paging

## 2016-07-30 NOTE — Progress Notes (Signed)
Entereg: P&T Approved Criteria for Use Policy  - Order is requested by a CREDENTIALED PRESCRIBER  - No therapeutic opioid doses for > 7 consecutive days immediately prior to Alvimopan use  -Prescribed for the FDA-approved indication: partial large or small bowel resection with pimary anastomosis. Note: exploratory laparotomy patients can qualify for the preop dose if they can take PO meds.  -Patient received a pre-operative dose (Patient did not receive) -Screen for ESRD and severe hepatic disease  -Drug is ordered ORALLY, not via an NG tube  -Schedule first postop dose for the morning of POD#1  -Limit therapy in any patient to NO MORE than 15 doses total.  -Therapy must be discontinued at discharge - the patient should never be given a discharge prescription.   Pharmacy approved to d/c post operative orders  Peggyann Juba, PharmD, BCPS 07/30/2016 12:02 PM

## 2016-07-31 ENCOUNTER — Encounter (HOSPITAL_COMMUNITY): Payer: Self-pay | Admitting: Surgery

## 2016-07-31 DIAGNOSIS — I10 Essential (primary) hypertension: Secondary | ICD-10-CM | POA: Diagnosis present

## 2016-07-31 DIAGNOSIS — N400 Enlarged prostate without lower urinary tract symptoms: Secondary | ICD-10-CM | POA: Diagnosis present

## 2016-07-31 DIAGNOSIS — E669 Obesity, unspecified: Secondary | ICD-10-CM

## 2016-07-31 DIAGNOSIS — G4730 Sleep apnea, unspecified: Secondary | ICD-10-CM | POA: Diagnosis present

## 2016-07-31 LAB — CBC
HCT: 35.9 % — ABNORMAL LOW (ref 39.0–52.0)
HEMOGLOBIN: 12.3 g/dL — AB (ref 13.0–17.0)
MCH: 30.8 pg (ref 26.0–34.0)
MCHC: 34.3 g/dL (ref 30.0–36.0)
MCV: 89.8 fL (ref 78.0–100.0)
Platelets: 198 10*3/uL (ref 150–400)
RBC: 4 MIL/uL — AB (ref 4.22–5.81)
RDW: 13 % (ref 11.5–15.5)
WBC: 8.4 10*3/uL (ref 4.0–10.5)

## 2016-07-31 LAB — BASIC METABOLIC PANEL
ANION GAP: 4 — AB (ref 5–15)
BUN: 7 mg/dL (ref 6–20)
CALCIUM: 8.8 mg/dL — AB (ref 8.9–10.3)
CHLORIDE: 106 mmol/L (ref 101–111)
CO2: 28 mmol/L (ref 22–32)
Creatinine, Ser: 0.71 mg/dL (ref 0.61–1.24)
GFR calc non Af Amer: >60 mL/min (ref >60)
Glucose, Bld: 108 mg/dL — ABNORMAL HIGH (ref 65–99)
Potassium: 3.9 mmol/L (ref 3.5–5.1)
SODIUM: 138 mmol/L (ref 135–145)

## 2016-07-31 LAB — MAGNESIUM: MAGNESIUM: 1.7 mg/dL (ref 1.7–2.4)

## 2016-07-31 MED ORDER — SODIUM CHLORIDE 0.9% FLUSH: 3.0000 mL | Freq: Two times a day (BID) | INTRAVENOUS | Status: DC

## 2016-07-31 MED ORDER — ACETAMINOPHEN 500 MG PO TABS: 1000.0000 mg | ORAL_TABLET | Freq: Four times a day (QID) | ORAL | Status: DC | PRN

## 2016-07-31 MED ORDER — SODIUM CHLORIDE 0.9% FLUSH: 3.0000 mL | INTRAVENOUS | Status: DC | PRN

## 2016-07-31 MED ORDER — SODIUM CHLORIDE 0.9 % IV SOLN: 250.0000 mL | INTRAVENOUS | Status: DC | PRN

## 2016-07-31 NOTE — Progress Notes (Signed)
Pt's vitals are WNL, tolerating diet and pain. Discussed discharge instructions with both patient and wife. Discharge home with prescription.

## 2016-07-31 NOTE — Discharge Summary (Signed)
Physician Discharge Summary  Patient ID: Brad Miller MRN: [id number] DOB/AGE: 1950-12-22 65 y.o.  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 07/30/2016 Active Problems:   Generalized anxiety disorder   OCD (obsessive compulsive disorder)   Obesity (BMI 30-39.9)   Hypertension   Enlarged prostate   Sleep apnea   Discharge Diagnoses:  Principal Problem:   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  Postoperatively, the patient gradually mobilized and advanced to a solid diet.  Pain and other symptoms were treated aggressively.    By the time of discharge, the patient was walking well the hallways, eating food, having flatus.  Pain was well-controlled on an oral medications.  Based on meeting discharge criteria and continuing to recover, I felt it was safe for the patient to be discharged from the hospital to further recover with close followup. Postoperative recommendations were discussed in detail.  They are written as well.   Significant Diagnostic Studies:  Results for orders placed or performed during the hospital encounter of 07/30/16 (from the  past 72 hour(s))  Basic metabolic panel     Status: Abnormal   Collection Time: 07/31/16  4:37 AM  Result Value Ref Range   Sodium 138 135 - 145 mmol/L   Potassium 3.9 3.5 - 5.1 mmol/L   Chloride 106 101 - 111 mmol/L   CO2 28 22 - 32 mmol/L   Glucose, Bld 108 (H) 65 - 99 mg/dL   BUN 7 6 - 20 mg/dL   Creatinine, Ser 0.71 0.61 - 1.24 mg/dL   Calcium 8.8 (L) 8.9 - 10.3 mg/dL   GFR calc non Af Amer >60 >60 mL/min   GFR calc Af Amer >60 >60 mL/min    Comment: (NOTE) The eGFR has been calculated using the CKD EPI equation. This calculation has not been validated in all clinical situations. eGFR's persistently <60 mL/min signify possible Chronic Kidney Disease.    Anion gap 4 (L) 5 - 15  CBC     Status: Abnormal   Collection Time: 07/31/16  4:37 AM  Result Value Ref Range   WBC 8.4 4.0 - 10.5 K/uL   RBC 4.00 (L) 4.22 - 5.81 MIL/uL   Hemoglobin 12.3 (L) 13.0 - 17.0 g/dL   HCT 35.9 (L) 39.0 - 52.0 %   MCV 89.8 78.0 - 100.0 fL   MCH 30.8 26.0 - 34.0 pg   MCHC 34.3 30.0 - 36.0 g/dL   RDW 13.0 11.5 - 15.5 %   Platelets 198 150 - 400 K/uL  Magnesium     Status: None   Collection Time: 07/31/16  4:37 AM  Result Value Ref Range   Magnesium 1.7 1.7 - 2.4 mg/dL    No results found.  Discharge  Exam: Blood pressure (!) 113/58, pulse 71, temperature 98.6 F (37 C), temperature source Oral, resp. rate 15, height _0  (1.753 m), weight 111 kg (244 lb 11.4 oz), SpO2 98 %.  General: Pt awake/alert/oriented x4 in no major acute distress.  Walking in room Eyes: PERRL, normal EOM. Sclera nonicteric Neuro: CN II-XII intact w/o focal sensory/motor deficits. Lymph: No head/neck/groin lymphadenopathy Psych:  No delerium/psychosis/paranoia HENT: Normocephalic, Mucus membranes moist.  No thrush Neck: Supple, No tracheal deviation Chest: No pain.  Good respiratory excursion. CV:  Pulses intact.  Regular rhythm MS: Normal AROM mjr joints.  No obvious deformity GU:  NEMG.  No inguinal hernias.   Foley out - voiding well Abdomen: Soft, Nondistended.  Nontender.  No incarcerated hernias. Ext:  SCDs BLE.  No significant edema.  No cyanosis Skin: No petechiae / purpura  Discharged Condition: good   Past Medical History:  Diagnosis Date  . Anxiety    TAKES XANAX DAILY AS NEEDED  . Arthritis   . Cataracts, bilateral   . Depression    TAKES WELLBUTRIN DAILY  . Enlarged prostate   . Headache(784.0)    OCCASIONALLY  . Hyperlipidemia    TAKES LOVASTATIN NIGHTLY  . Hypertension    TAKES CARDURA DAILY  . Joint pain   . MDD (major depressive disorder), recurrent severe, without psychosis ([place]) 11/02/2015  . Panic attacks 11/02/2015  . Sleep apnea    USES CPAP, PT DOES NOT KNOW SETTINGS  . Urinary frequency   . Urinary urgency     Past Surgical History:  Procedure Laterality Date  . BENIGN TUMOR REMOVED FROM CHEST    . CATARACT EXTRACTION Bilateral   . EUS N/A 05/21/2016   Procedure: LOWER ENDOSCOPIC ULTRASOUND (EUS);  Surgeon: Arta Silence, MD;  [facility] END[facility]COPY;  Service: Endoscopy;  Laterality: N/A;  . FLEXIBLE SIGMOIDOSCOPY N/A 05/21/2016   Procedure: FLEXIBLE SIGMOIDOSCOPY;  Surgeon: Arta Silence, MD;  Location: [facility] ENDOSCOPY;  Service: Endoscopy;  Laterality: N/A;  . KNEE ARTHROSCOPY Left 02/23/2014   Procedure: LEFT ARTHROSCOPY KNEE;  Surgeon: Kerin Salen, MD;  Location: [place];  Service: Orthopedics;  Laterality: Left;  . TOTAL KNEE ARTHROPLASTY Right 02/23/2014   Procedure: TOTAL KNEE ARTHROPLASTY;  Surgeon: Kerin Salen, MD;  Location: [place];  Service: Orthopedics;  Laterality: Right;    Social History   Social History  . Marital status: Married    Spouse name: N/A  . Number of children: 2  . Years of education: N/A   Occupational History  . Not Working    Social History Main Topics  . Smoking status: Never Smoker  . Smokeless tobacco: Never Used  . Alcohol use 0.6 oz/week    1 Cans of beer per week     Comment: Rare  . Drug use: No  . Sexual  activity: Not Currently   Other Topics Concern  . Not on file   Social History Narrative  . No narrative on file    Family History  Problem Relation Age of Onset  . Hypertension Mother   . Heart attack Mother   . Osteoporosis Mother   . CVA Mother   . Coronary artery disease Mother   . Hypertension Father   . Emphysema Father   . Dementia Father     father tried to commit suicide when he had dementia  . Coronary artery disease Brother   . Diabetes Brother     Current Facility-Administered Medications  Medication Dose Route Frequency Provider Last  Rate Last Dose  . 0.9 %  sodium chloride infusion  250 mL Intravenous PRN Michael Boston, MD      . acetaminophen (TYLENOL) tablet 1,000 mg  1,000 mg Oral Q6H PRN Michael Boston, MD      . alum & mag hydroxide-simeth (MAALOX/MYLANTA) 200-200-20 MG/5ML suspension 30 mL  30 mL Oral Q6H PRN Michael Boston, MD      . buPROPion (WELLBUTRIN XL) 24 hr tablet 300 mg  300 mg Oral Daily Michael Boston, MD      . cycloSPORINE (RESTASIS) 0.05 % ophthalmic emulsion 1 drop  1 drop Both Eyes BID Michael Boston, MD   1 drop at 07/30/16 2228  . diphenhydrAMINE (BENADRYL) 12.5 MG/5ML elixir 12.5 mg  12.5 mg Oral Q6H PRN Michael Boston, MD       Or  . diphenhydrAMINE (BENADRYL) injection 12.5 mg  12.5 mg Intravenous Q6H PRN Michael Boston, MD      . doxazosin (CARDURA) tablet 4 mg  4 mg Oral QPM Michael Boston, MD   4 mg at 07/30/16 1809  . enoxaparin (LOVENOX) injection 40 mg  40 mg Subcutaneous Q24H Michael Boston, MD   40 mg at 07/31/16 0757  . feeding supplement (ENSURE ENLIVE) (ENSURE ENLIVE) liquid 237 mL  237 mL Oral BID BM Michael Boston, MD      . HYDROmorphone (DILAUDID) injection 0.5-2 mg  0.5-2 mg Intravenous Q1H PRN Michael Boston, MD   0.5 mg at 07/30/16 1950  . lactated ringers bolus 1,000 mL  1,000 mL Intravenous Q8H PRN Michael Boston, MD   1,000 mL at 07/30/16 2215  . lip balm (CARMEX) ointment 1 application  1 application Topical BID Michael Boston, MD   1  application at 09/32/35 2200  . LORazepam (ATIVAN) injection 0.5-1 mg  0.5-1 mg Intravenous Q8H PRN Michael Boston, MD   0.5 mg at 07/31/16 0035  . magic mouthwash  15 mL Oral QID PRN Michael Boston, MD      . menthol-cetylpyridinium (CEPACOL) lozenge 3 mg  1 lozenge Oral PRN Michael Boston, MD      . metoprolol (LOPRESSOR) injection 5 mg  5 mg Intravenous Q6H PRN Michael Boston, MD      . oxyCODONE (Oxy IR/ROXICODONE) immediate release tablet 5-10 mg  5-10 mg Oral Q4H PRN Michael Boston, MD   5 mg at 07/31/16 0757  . phenol (CHLORASEPTIC) mouth spray 2 spray  2 spray Mouth/Throat PRN Michael Boston, MD      . polyethylene glycol (MIRALAX / GLYCOLAX) packet 17 g  17 g Oral Daily Michael Boston, MD      . prochlorperazine (COMPAZINE) injection 10 mg  10 mg Intravenous Q6H PRN Michael Boston, MD      . pyridOXINE (VITAMIN B-6) tablet 100 mg  100 mg Oral Daily Michael Boston, MD      . risperiDONE (RISPERDAL) tablet 0.5 mg  0.5 mg Oral QHS Michael Boston, MD   0.5 mg at 07/30/16 2227  . saccharomyces boulardii (FLORASTOR) capsule 250 mg  250 mg Oral BID Michael Boston, MD   250 mg at 07/30/16 2227  . sodium chloride flush (NS) 0.9 % injection 3 mL  3 mL Intravenous Q12H Michael Boston, MD      . sodium chloride flush (NS) 0.9 % injection 3 mL  3 mL Intravenous PRN Michael Boston, MD      . tamsulosin (FLOMAX) capsule 0.4 mg  0.4 mg Oral Daily Michael Boston, MD      . traZODone (DESYREL)  tablet 200 mg  200 mg Oral QHS Michael Boston, MD   200 mg at 07/30/16 2227  . vitamin B-12 (CYANOCOBALAMIN) tablet 100 mcg  100 mcg Oral Daily Michael Boston, MD         Allergies  Allergen Reactions  . Ciprofloxacin     GI side effects   . Penicillins Other (See Comments)    Childhood allergy. Knot on arm Has patient had a PCN reaction causing immediate rash, facial/tongue/throat swelling, SOB or lightheadedness with hypotension: no Has patient had a PCN reaction causing severe rash involving mucus membranes or skin necrosis: no Has  patient had a PCN reaction that required hospitalization: dr's office visit Has patient had a PCN reaction occurring within the last 10 years: yes If all of the above answers are "NO", then may pro  . Zocor [Simvastatin]     Back aches   . Hydrocodone Itching    consitpation    Disposition: 01-Home or Self Care  Discharge Instructions    Call MD for:    Complete by:  As directed   Temperature > 101.64F   Call MD for:    Complete by:  As directed   Temperature > 101.64F   Call MD for:  extreme fatigue    Complete by:  As directed   Call MD for:  extreme fatigue    Complete by:  As directed   Call MD for:  hives    Complete by:  As directed   Call MD for:  hives    Complete by:  As directed   Call MD for:  persistant nausea and vomiting    Complete by:  As directed   Call MD for:  persistant nausea and vomiting    Complete by:  As directed   Call MD for:  redness, tenderness, or signs of infection (pain, swelling, redness, odor or green/yellow discharge around incision site)    Complete by:  As directed   Call MD for:  redness, tenderness, or signs of infection (pain, swelling, redness, odor or green/yellow discharge around incision site)    Complete by:  As directed   Call MD for:  severe uncontrolled pain    Complete by:  As directed   Call MD for:  severe uncontrolled pain    Complete by:  As directed   Discharge instructions    Complete by:  As directed   Please see discharge instruction sheets.   Also refer to any handouts/printouts that may have been given from the [facility] surgery office (if you visited [place] there before surgery) Please call our office if you have any questions or concerns [phone]   Discharge instructions    Complete by:  As directed   Please see discharge instruction sheets.   Also refer to any handouts/printouts that may have been given from the [facility] surgery office (if you visited [place] there before surgery) Please call our office if you have any questions or  concerns [phone]   Discharge wound care:    Complete by:  As directed   If you have closed incisions: Shower and bathe over these incisions with soap and water every day.  It is OK to wash over the dressings: they are waterproof. Remove all surgical dressings on postoperative day #3.  You do not need to replace dressings over the closed incisions unless you feel more comfortable with a Band-Aid covering it.   If you have an open wound: That requires packing, so please  see wound care instructions.   In general, remove all dressings, wash wound with soap and water and then replace with saline moistened gauze.  Do the dressing change at least every day.    Please call our office [phone] if you have further questions.   Discharge wound care:    Complete by:  As directed   If you have closed incisions: Shower and bathe over these incisions with soap and water every day.  It is OK to wash over the dressings: they are waterproof. Remove all surgical dressings on postoperative day #3.  You do not need to replace dressings over the closed incisions unless you feel more comfortable with a Band-Aid covering it.   If you have an open wound: That requires packing, so please see wound care instructions.   In general, remove all dressings, wash wound with soap and water and then replace with saline moistened gauze.  Do the dressing change at least every day.    Please call our office [phone] if you have further questions.   Driving Restrictions    Complete by:  As directed   No driving until off narcotics and can safely swerve away without pain during an emergency   Driving Restrictions    Complete by:  As directed   No driving until off narcotics and can safely swerve away without pain during an emergency   Increase activity slowly    Complete by:  As directed   Increase activity slowly    Complete by:  As directed   Lifting restrictions    Complete by:  As directed   Avoid heavy  lifting initially, <20 pounds at first.   Do not push through pain.   You have no specific weight limit: If it hurts to do, DON'T DO IT.    If you feel no pain, you are not injuring anything.  Pain will protect you from injury.   Coughing and sneezing are far more stressful to your incision than any lifting.   Avoid resuming heavy lifting (>50 pounds) or other intense activity until off all narcotic pain medications.   When want to exercise more, give yourself 2 weeks to gradually get back to full intense exercise/activity.   Lifting restrictions    Complete by:  As directed   Avoid heavy lifting initially, <20 pounds at first.   Do not push through pain.   You have no specific weight limit: If it hurts to do, DON'T DO IT.    If you feel no pain, you are not injuring anything.  Pain will protect you from injury.   Coughing and sneezing are far more stressful to your incision than any lifting.   Avoid resuming heavy lifting (>50 pounds) or other intense activity until off all narcotic pain medications.   When want to exercise more, give yourself 2 weeks to gradually get back to full intense exercise/activity.   May shower / Bathe    Complete by:  As directed   [place].  It is fine for dressings or wounds to be washed/rinsed.  Use gentle soap & water.  This will help the incisions and/or wounds get clean & minimize infection.   May shower / Bathe    Complete by:  As directed   [place].  It is fine for dressings or wounds to be washed/rinsed.  Use gentle soap & water.  This will help the incisions and/or wounds get clean & minimize infection.   May walk  up steps    Complete by:  As directed   May walk up steps    Complete by:  As directed   Sexual Activity Restrictions    Complete by:  As directed   Sexual activity as tolerated.  Do not push through pain.  Pain will protect you from injury.   Sexual Activity Restrictions    Complete by:  As directed   Sexual activity as  tolerated.  Do not push through pain.  Pain will protect you from injury.   Walk with assistance    Complete by:  As directed   Walk over an hour a day.  May use a walker/cane/companion to help with balance and stamina.   Walk with assistance    Complete by:  As directed   Walk over an hour a day.  May use a walker/cane/companion to help with balance and stamina.       Medication List    STOP taking these medications   clonazePAM 0.5 MG tablet Commonly known as:  KLONOPIN   testosterone cypionate 200 MG/ML injection Commonly known as:  DEPOTESTOSTERONE CYPIONATE     TAKE these medications   acetaminophen 500 MG tablet Commonly known as:  TYLENOL Take 500 mg by mouth every 6 (six) hours as needed for moderate pain or headache.   buPROPion 300 MG 24 hr tablet Commonly known as:  WELLBUTRIN XL Take 1 tablet (300 mg total) by mouth daily.   doxazosin 8 MG tablet Commonly known as:  CARDURA Take 0.5 tablets (4 mg total) by mouth daily. What changed:  when to take this   multivitamin with minerals tablet Take 1 tablet by mouth daily.   POTASSIUM CHLORIDE PO Take 99 mg by mouth every evening.   pyridOXINE 50 MG tablet Commonly known as:  VITAMIN B-6 Take 100 mg by mouth daily.   RAPAFLO 8 MG Caps capsule Generic drug:  silodosin Take 8 mg by mouth daily with breakfast.   RESTASIS 0.05 % ophthalmic emulsion Generic drug:  cycloSPORINE Place 1 drop into both eyes 2 (two) times daily.   risperiDONE 0.25 MG tablet Commonly known as:  RISPERDAL 2  qhs What changed:  how much to take  how to take this  when to take this  additional instructions   traMADol 50 MG tablet Commonly known as:  ULTRAM Take 1-2 tablets (50-100 mg total) by mouth every 6 (six) hours as needed for moderate pain or severe pain.   traZODone 100 MG tablet Commonly known as:  DESYREL 1 qhs  May increase to 2  qhs What changed:  how much to take  how to take this  when to take  this  additional instructions   TURMERIC PO Take 1 tablet by mouth daily.   vitamin B-12 100 MCG tablet Commonly known as:  CYANOCOBALAMIN Take 100 mcg by mouth daily.         Signed: Morton Peters, M.D., F.A.C.S. Gastrointestinal and [facility]ally [facility] [address] [place], [place] [address] [phone] Main / Paging   07/31/2016, 9:18 AM

## 2016-08-12 DIAGNOSIS — M25511 Pain in right shoulder: Secondary | ICD-10-CM | POA: Diagnosis not present

## 2016-08-12 DIAGNOSIS — M3500 Sicca syndrome, unspecified: Secondary | ICD-10-CM | POA: Diagnosis not present

## 2016-08-12 DIAGNOSIS — M150 Primary generalized (osteo)arthritis: Secondary | ICD-10-CM | POA: Diagnosis not present

## 2016-08-12 DIAGNOSIS — M2550 Pain in unspecified joint: Secondary | ICD-10-CM | POA: Diagnosis not present

## 2016-08-12 DIAGNOSIS — C20 Malignant neoplasm of rectum: Secondary | ICD-10-CM | POA: Diagnosis not present

## 2016-08-17 DIAGNOSIS — R3915 Urgency of urination: Secondary | ICD-10-CM | POA: Diagnosis not present

## 2016-08-17 DIAGNOSIS — R350 Frequency of micturition: Secondary | ICD-10-CM | POA: Diagnosis not present

## 2016-09-09 DIAGNOSIS — M3500 Sicca syndrome, unspecified: Secondary | ICD-10-CM | POA: Diagnosis not present

## 2016-09-09 DIAGNOSIS — M2550 Pain in unspecified joint: Secondary | ICD-10-CM | POA: Diagnosis not present

## 2016-09-09 DIAGNOSIS — M150 Primary generalized (osteo)arthritis: Secondary | ICD-10-CM | POA: Diagnosis not present

## 2016-09-30 ENCOUNTER — Ambulatory Visit (INDEPENDENT_AMBULATORY_CARE_PROVIDER_SITE_OTHER): Payer: PPO | Admitting: Psychiatry

## 2016-09-30 ENCOUNTER — Encounter (HOSPITAL_COMMUNITY): Payer: Self-pay | Admitting: Psychiatry

## 2016-09-30 VITALS — BP 124/74 | HR 78 | Ht 69.0 in | Wt 237.6 lb

## 2016-09-30 DIAGNOSIS — Z833 Family history of diabetes mellitus: Secondary | ICD-10-CM

## 2016-09-30 DIAGNOSIS — Z8249 Family history of ischemic heart disease and other diseases of the circulatory system: Secondary | ICD-10-CM

## 2016-09-30 DIAGNOSIS — Z818 Family history of other mental and behavioral disorders: Secondary | ICD-10-CM

## 2016-09-30 DIAGNOSIS — Z8659 Personal history of other mental and behavioral disorders: Secondary | ICD-10-CM

## 2016-09-30 DIAGNOSIS — Z8262 Family history of osteoporosis: Secondary | ICD-10-CM

## 2016-09-30 DIAGNOSIS — F329 Major depressive disorder, single episode, unspecified: Secondary | ICD-10-CM

## 2016-09-30 MED ORDER — RISPERIDONE 0.25 MG PO TABS: 0.2500 mg | ORAL_TABLET | Freq: Every day | ORAL | 3 refills | Status: DC

## 2016-09-30 MED ORDER — BUPROPION HCL ER (XL) 300 MG PO TB24: 300.0000 mg | ORAL_TABLET | Freq: Every day | ORAL | 4 refills | Status: DC

## 2016-09-30 NOTE — Progress Notes (Signed)
Patient ID: HAKEEN PULEIO, male   DOB: 1951-06-16, 65 y.o.   MRN: [id number] [facility] MD Progress Note.  Patient Identification: JOURDEN KIRLEY MRN:  [id number]  Subjective-   I am fine    Visit Diagnosis:   No diagnosis found. Diagnosis:   Patient Active Problem List   Diagnosis Date Noted  . Obesity (BMI 30-39.9) [E66.9] 07/31/2016  . Hypertension [I10]   . Enlarged prostate [N40.0]   . Sleep apnea [G47.30]   . 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 OCD (obsessive compulsive disorder) [F42.9] 12/31/2015  . Memory changes [R41.3] 12/27/2015  . Generalized anxiety disorder [F41.1] 11/12/2015  . MDD (major depressive disorder), recurrent severe, without psychosis ([place]) [F33.2] 11/02/2015  . Panic attacks [F41.0] 11/02/2015  . Arthritis of knee, right [M17.11] 02/23/2014    Class: Chronic   History of Present Illness:   Today the patient is doing very well. It is his birthday. The patient denies daily depression. He sleeping and eating well. He enjoys reading watching TV and his dog. His mood is very stable. He denies any anxiety symptoms. He denies any obsessive compulsive disorder symptoms. Is not psychotic. He's done well having off the Klonopin. We adjusted his Wellbutrin to taking 300 mg in the morning which seems to have worked out well. He continues to take Risperdal 0.25 mg 2 at night. In close evaluation is apparent that he was started on Risperdal this year for questionable obsessive-compulsive symptoms. The patient is unable to describe exact with her symptoms are. Is clearly was never started on because of agitation or psychosis. The patient is doing very well. His wife just had OB/GYN surgery and she is in the bed. The patient's helping her great deal. The patient is very stable is functioning well. He has no complaints at all. He denies chest pain shortness of breath or any neurological symptoms. He shows no evidence of  tardive dyskinesia. The patient has no diabetes. He is no hypertension. .    Substance Abuse History in the last 12 months:  Yes.   patient's drinks 1-2 glasses of wine occasionally he is a social drinker.  Consequences of Substance Abuse: NA   Past psychiatric history.---------- Patient was hospitalized 20 years ago at [facility] psychiatric unit because of leaving his first wife to marry his current wife and felt guilty about it. No outpatient follow-up. Hospitalized at [facility] H adult unit at [facility] from 11/01/2015 01/31/1659   Past Medical History: Reviewed as listed below Past Medical History:  Diagnosis Date  . Anxiety    TAKES XANAX DAILY AS NEEDED  . Arthritis   . Cataracts, bilateral   . Depression    TAKES WELLBUTRIN DAILY  . Enlarged prostate   . Headache(784.0)    OCCASIONALLY  . Hyperlipidemia    TAKES LOVASTATIN NIGHTLY  . Hypertension    TAKES CARDURA DAILY  . Joint pain   . MDD (major depressive disorder), recurrent severe, without psychosis ([place]) 11/02/2015  . Panic attacks 11/02/2015  . Sleep apnea    USES CPAP, PT DOES NOT KNOW SETTINGS  . Urinary frequency   . Urinary urgency     Past Surgical History:  Procedure Laterality Date  . BENIGN TUMOR REMOVED FROM CHEST    . CATARACT EXTRACTION Bilateral   . EUS N/A 05/21/2016   Procedure: LOWER ENDOSCOPIC ULTRASOUND (EUS);  Surgeon: Arta Silence, MD;  Location: [facility] ENDOSCOPY;  Service: Endoscopy;  Laterality: N/A;  .  FLEXIBLE SIGMOIDOSCOPY N/A 05/21/2016   Procedure: FLEXIBLE SIGMOIDOSCOPY;  Surgeon: Arta Silence, MD;  Location: [facility] ENDOSCOPY;  Service: Endoscopy;  Laterality: N/A;  . KNEE ARTHROSCOPY Left 02/23/2014   Procedure: LEFT ARTHROSCOPY KNEE;  Surgeon: Kerin Salen, MD;  Location: [place];  Service: Orthopedics;  Laterality: Left;  . PARTIAL PROCTECTOMY BY TEM N/A 07/30/2016   Procedure: PARTIAL PROCTECTOMY BY TEM WITH EXAM UNDER ANESTHESIA;  Surgeon: Michael Boston, MD;  Location: [facility];   Service: General;  Laterality: N/A;  . TOTAL KNEE ARTHROPLASTY Right 02/23/2014   Procedure: TOTAL KNEE ARTHROPLASTY;  Surgeon: Kerin Salen, MD;  Location: [place];  Service: Orthopedics;  Laterality: Right;   Family History: Reviewed as listed below Family History  Problem Relation Age of Onset  . Hypertension Mother   . Heart attack Mother   . Osteoporosis Mother   . CVA Mother   . Coronary artery disease Mother   . Hypertension Father   . Emphysema Father   . Dementia Father     father tried to commit suicide when he had dementia  . Coronary artery disease Brother   . Diabetes Brother    Social History:  Patient lives with his wife in [place]  . Marital status: Married    Spouse name: N/A  . Number of children: 2  . Years of education: N/A   Occupational History  . Not Working    Social History Main Topics  . Smoking status: Never Smoker  . Smokeless tobacco: Never Used  . Alcohol use 0.6 oz/week    1 Cans of beer per week     Comment: Rare  . Drug use: No  . Sexual activity: Not Currently   Other Topics Concern  . None   Social History Narrative  . None     Musculoskeletal: Strength & Muscle Tone: within normal limits Gait & Station: normal Patient leans: N/A  Psychiatric Specialty Exam: HPI  Review of Systems  Constitutional: Negative.   HENT: Negative.   Eyes: Negative.   Respiratory: Negative.   Cardiovascular: Negative.   Gastrointestinal: Negative.   Genitourinary: Negative.   Musculoskeletal: Negative.   Skin: Negative.   Neurological: Negative.   Endo/Heme/Allergies: Negative.   Psychiatric/Behavioral: Positive for depression. Negative for memory loss. The patient is nervous/anxious.    being frankly and she is   Blood pressure 124/74, pulse 78, height 5\' 9"  (1.753 m), weight 237 lb 9.6 oz (107.8 kg).Body mass index is 35.09 kg/m.  General Appearance: Casual  Eye Contact:  Good  Speech:  Clear and  Coherent and Normal Rate  Volume:  Normal  Mood:mild   anxious   Affect:  More appropriate   Thought Process:  Goal Directed, Linear and Logical  Orientation:  Full (Time, Place, and Person)  Thought Content:  WDL  Suicidal Thoughts:  No  Homicidal Thoughts:  No  Memory:  Immediate is good, recent and remote memory is good   Judgement:  Good   Insight:  Fair  Psychomotor Activity:  Normal  Concentration:  Fair  Recall:  Good   Fund of Knowledge:Fair  Language: Good  Akathisia:  No  Handed:  Right  AIMS (if indicated):  0  Assets:  Communication Skills Desire for Improvement Housing Resilience Social Support Transportation  ADL's:  Intact  Cognition: WNL  Sleep:     Is the patient at risk to self?  No. Has the patient been a risk to self in  the past 6 months?  Yes.   Has the patient been a risk to self within the distant past?  No. Is the patient a risk to others?  No. Has the patient been a risk to others in the past 6 months?  No. Has the patient been a risk to others within the distant past?  No.  Allergies:   reviewed as listed below  Allergies  Allergen Reactions  . Ciprofloxacin     GI side effects   . Penicillins Other (See Comments)    Childhood allergy. Knot on arm Has patient had a PCN reaction causing immediate rash, facial/tongue/throat swelling, SOB or lightheadedness with hypotension: no Has patient had a PCN reaction causing severe rash involving mucus membranes or skin necrosis: no Has patient had a PCN reaction that required hospitalization: dr's office visit Has patient had a PCN reaction occurring within the last 10 years: yes If all of the above answers are "NO", then may pro  . Zocor [Simvastatin]     Back aches   . Hydrocodone Itching    consitpation   Current Medications:reviewed as listed below  Current Outpatient Prescriptions  Medication Sig Dispense Refill  . acetaminophen (TYLENOL) 500 MG tablet Take 500 mg by mouth every 6 (six)  hours as needed for moderate pain or headache.    Marland Kitchen buPROPion (WELLBUTRIN XL) 300 MG 24 hr tablet Take 1 tablet (300 mg total) by mouth daily. 30 tablet 4  . cycloSPORINE (RESTASIS) 0.05 % ophthalmic emulsion Place 1 drop into both eyes 2 (two) times daily.     Marland Kitchen doxazosin (CARDURA) 8 MG tablet Take 0.5 tablets (4 mg total) by mouth daily. (Patient taking differently: Take 4 mg by mouth every evening. )    . Multiple Vitamins-Minerals (MULTIVITAMIN WITH MINERALS) tablet Take 1 tablet by mouth daily.    Marland Kitchen POTASSIUM CHLORIDE PO Take 99 mg by mouth every evening.     . pyridOXINE (VITAMIN B-6) 50 MG tablet Take 100 mg by mouth daily.     . risperiDONE (RISPERDAL) 0.25 MG tablet Take 1 tablet (0.25 mg total) by mouth at bedtime. 2  qhs 30 tablet 3  . silodosin (RAPAFLO) 8 MG CAPS capsule Take 8 mg by mouth daily with breakfast.    . traMADol (ULTRAM) 50 MG tablet Take 1-2 tablets (50-100 mg total) by mouth every 6 (six) hours as needed for moderate pain or severe pain. 20 tablet 0  . traZODone (DESYREL) 100 MG tablet 1 qhs  May increase to 2  qhs (Patient taking differently: Take 200 mg by mouth at bedtime. 1 qhs  May increase to 2  qhs) 60 tablet 4  . TURMERIC PO Take 1 tablet by mouth daily.     . vitamin B-12 (CYANOCOBALAMIN) 100 MCG tablet Take 100 mcg by mouth daily.     No current facility-administered medications for this visit.     Previous Psychotropic Medications: Yes     Medical Decision Making At this time this patient will continue taking Wellbutrin 300 mg in the morning but will go ahead and reduce his Risperdal from taking 2 at night to just taking one simple pill 0.25 mg each night. When he returns in 3 months he'll bring his wife and if everything is consistent that he didn't take Risperdal for psychosis and only for some questionable OCD symptoms we'll go ahead and discontinue his Risperdal at his next visit. He'll continue taking his Wellbutrin as prescribed. Patient is very  stable. Today  we spent with a 50% of the time discussing his medications and their pros and cons. At this time the patient is doing very well. Is not suicidal. He's functioning very well.

## 2016-11-10 DIAGNOSIS — R399 Unspecified symptoms and signs involving the genitourinary system: Secondary | ICD-10-CM | POA: Diagnosis not present

## 2016-11-10 DIAGNOSIS — Z87438 Personal history of other diseases of male genital organs: Secondary | ICD-10-CM | POA: Diagnosis not present

## 2016-11-10 DIAGNOSIS — R7301 Impaired fasting glucose: Secondary | ICD-10-CM | POA: Diagnosis not present

## 2016-11-10 DIAGNOSIS — R5382 Chronic fatigue, unspecified: Secondary | ICD-10-CM | POA: Diagnosis not present

## 2016-11-10 DIAGNOSIS — L989 Disorder of the skin and subcutaneous tissue, unspecified: Secondary | ICD-10-CM | POA: Diagnosis not present

## 2016-11-10 DIAGNOSIS — R7309 Other abnormal glucose: Secondary | ICD-10-CM | POA: Diagnosis not present

## 2016-11-16 DIAGNOSIS — R351 Nocturia: Secondary | ICD-10-CM | POA: Diagnosis not present

## 2016-11-16 DIAGNOSIS — R350 Frequency of micturition: Secondary | ICD-10-CM | POA: Diagnosis not present

## 2016-12-02 DIAGNOSIS — L720 Epidermal cyst: Secondary | ICD-10-CM | POA: Diagnosis not present

## 2016-12-09 DIAGNOSIS — Z79899 Other long term (current) drug therapy: Secondary | ICD-10-CM | POA: Diagnosis not present

## 2016-12-09 DIAGNOSIS — Z6837 Body mass index (BMI) 37.0-37.9, adult: Secondary | ICD-10-CM | POA: Diagnosis not present

## 2016-12-09 DIAGNOSIS — E669 Obesity, unspecified: Secondary | ICD-10-CM | POA: Diagnosis not present

## 2016-12-09 DIAGNOSIS — M150 Primary generalized (osteo)arthritis: Secondary | ICD-10-CM | POA: Diagnosis not present

## 2016-12-09 DIAGNOSIS — M2550 Pain in unspecified joint: Secondary | ICD-10-CM | POA: Diagnosis not present

## 2016-12-09 DIAGNOSIS — M25511 Pain in right shoulder: Secondary | ICD-10-CM | POA: Diagnosis not present

## 2016-12-30 ENCOUNTER — Ambulatory Visit (INDEPENDENT_AMBULATORY_CARE_PROVIDER_SITE_OTHER): Payer: PPO | Admitting: Psychiatry

## 2016-12-30 ENCOUNTER — Encounter (HOSPITAL_COMMUNITY): Payer: Self-pay | Admitting: Psychiatry

## 2016-12-30 VITALS — BP 132/76 | HR 79 | Ht 69.0 in | Wt 257.2 lb

## 2016-12-30 DIAGNOSIS — F325 Major depressive disorder, single episode, in full remission: Secondary | ICD-10-CM | POA: Diagnosis not present

## 2016-12-30 DIAGNOSIS — Z8249 Family history of ischemic heart disease and other diseases of the circulatory system: Secondary | ICD-10-CM

## 2016-12-30 DIAGNOSIS — Z833 Family history of diabetes mellitus: Secondary | ICD-10-CM

## 2016-12-30 DIAGNOSIS — Z8262 Family history of osteoporosis: Secondary | ICD-10-CM

## 2016-12-30 DIAGNOSIS — Z9889 Other specified postprocedural states: Secondary | ICD-10-CM

## 2016-12-30 DIAGNOSIS — Z888 Allergy status to other drugs, medicaments and biological substances status: Secondary | ICD-10-CM

## 2016-12-30 DIAGNOSIS — Z79899 Other long term (current) drug therapy: Secondary | ICD-10-CM

## 2016-12-30 DIAGNOSIS — Z880 Allergy status to penicillin: Secondary | ICD-10-CM

## 2016-12-30 MED ORDER — BUPROPION HCL ER (XL) 300 MG PO TB24: 300.0000 mg | ORAL_TABLET | Freq: Every day | ORAL | 5 refills | Status: AC

## 2016-12-30 NOTE — Progress Notes (Signed)
Patient ID: Brad Miller, male   DOB: [birth year], 66 y.o.   MRN: [id number] [facility] MD Progress Note.  Patient Identification: Brad Miller MRN:  [id number]  Subjective-   I am fine    Visit Diagnosis:   No diagnosis found. Diagnosis:   Patient Active Problem List   Diagnosis Date Noted  . Obesity (BMI 30-39.9) [E66.9] 07/31/2016  . Hypertension [I10]   . Enlarged prostate [N40.0]   . Sleep apnea [G47.30]   . 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 OCD (obsessive compulsive disorder) [F42.9] 12/31/2015  . Memory changes [R41.3] 12/27/2015  . Generalized anxiety disorder [F41.1] 11/12/2015  . MDD (major depressive disorder), recurrent severe, without psychosis ([place]) [F33.2] 11/02/2015  . Panic attacks [F41.0] 11/02/2015  . Arthritis of knee, right [M17.11] 02/23/2014    Class: Chronic   History of Present Illness:  Today the patient is seen on time by himself. He is doing very well. He denies any psychosocial stressors. Life is good. He likes things to say as they are. The patient denies depression or anxiety. He denies any OCD symptoms. He denies any problems of sleep or appetite. Drink any alcohol or use any drugs. He is reduced his Risperdal down to just taking .25 mg pill at night and notes no difference. He takes 300 mg of Wellbutrin every morning. It is noted that the Klonopin is gone and so is the trazodone. The patient enjoys the television listening to and has hobbies. He has a poodle that he likes a lot. Interview the patient's had 2 episodes of depression requiring hospitalization. His last one was in 2016. At that time he was not suicidal not psychotic. Hard to describe exactly why he was admitted at that time. At this time he is doing very well. He denies any chest pain or shortness of breath. He is medically well. He is on a number of medical medications and he sees Dr. [employer] as  his primary care physician.   Substance  Abuse History in the last 12 months:  Yes.   patient's drinks 1-2 glasses of wine occasionally he is a social drinker.  Consequences of Substance Abuse: NA   Past psychiatric history.---------- Patient was hospitalized 20 years ago at [facility] psychiatric unit because of leaving his first wife to marry his current wife and felt guilty about it. No outpatient follow-up. Hospitalized at [facility] H adult unit at [facility] from 11/01/2015 01/31/1659   Past Medical History: Reviewed as listed below Past Medical History:  Diagnosis Date  . Anxiety    TAKES XANAX DAILY AS NEEDED  . Arthritis   . Cataracts, bilateral   . Depression    TAKES WELLBUTRIN DAILY  . Enlarged prostate   . Headache(784.0)    OCCASIONALLY  . Hyperlipidemia    TAKES LOVASTATIN NIGHTLY  . Hypertension    TAKES CARDURA DAILY  . Joint pain   . MDD (major depressive disorder), recurrent severe, without psychosis ([place]) 11/02/2015  . Panic attacks 11/02/2015  . Sleep apnea    USES CPAP, PT DOES NOT KNOW SETTINGS  . Urinary frequency   . Urinary urgency     Past Surgical History:  Procedure Laterality Date  . BENIGN TUMOR REMOVED FROM CHEST    . CATARACT EXTRACTION Bilateral   . EUS N/A 05/21/2016   Procedure: LOWER ENDOSCOPIC ULTRASOUND (EUS);  Surgeon: Arta Silence, MD;  Location: [facility] ENDOSCOPY;  Service: Endoscopy;  Laterality: N/A;  .  FLEXIBLE SIGMOIDOSCOPY N/A 05/21/2016   Procedure: FLEXIBLE SIGMOIDOSCOPY;  Surgeon: Arta Silence, MD;  Location: [facility] ENDOSCOPY;  Service: Endoscopy;  Laterality: N/A;  . KNEE ARTHROSCOPY Left 02/23/2014   Procedure: LEFT ARTHROSCOPY KNEE;  Surgeon: Kerin Salen, MD;  Location: [place];  Service: Orthopedics;  Laterality: Left;  . PARTIAL PROCTECTOMY BY TEM N/A 07/30/2016   Procedure: PARTIAL PROCTECTOMY BY TEM WITH EXAM UNDER ANESTHESIA;  Surgeon: Michael Boston, MD;  Location: [facility];  Service: General;  Laterality: N/A;  . TOTAL KNEE ARTHROPLASTY Right 02/23/2014   Procedure:  TOTAL KNEE ARTHROPLASTY;  Surgeon: Kerin Salen, MD;  Location: [place];  Service: Orthopedics;  Laterality: Right;   Family History: Reviewed as listed below Family History  Problem Relation Age of Onset  . Hypertension Mother   . Heart attack Mother   . Osteoporosis Mother   . CVA Mother   . Coronary artery disease Mother   . Hypertension Father   . Emphysema Father   . Dementia Father     father tried to commit suicide when he had dementia  . Coronary artery disease Brother   . Diabetes Brother    Social History:  Patient lives with his wife in [place]  . Marital status: Married    Spouse name: N/A  . Number of children: 2  . Years of education: N/A   Occupational History  . Not Working    Social History Main Topics  . Smoking status: Never Smoker  . Smokeless tobacco: Never Used  . Alcohol use 0.6 oz/week    1 Cans of beer per week     Comment: Rare  . Drug use: No  . Sexual activity: Not Currently   Other Topics Concern  . None   Social History Narrative  . None     Musculoskeletal: Strength & Muscle Tone: within normal limits Gait & Station: normal Patient leans: N/A  Psychiatric Specialty Exam: HPI  Review of Systems  Constitutional: Negative.   HENT: Negative.   Eyes: Negative.   Respiratory: Negative.   Cardiovascular: Negative.   Gastrointestinal: Negative.   Genitourinary: Negative.   Musculoskeletal: Negative.   Skin: Negative.   Neurological: Negative.   Endo/Heme/Allergies: Negative.   Psychiatric/Behavioral: Positive for depression. Negative for memory loss. The patient is nervous/anxious.    being frankly and she is   Blood pressure 132/76, pulse 79, height 5\' 9"  (1.753 m), weight 257 lb 3.2 oz (116.7 kg).Body mass index is 37.98 kg/m.  General Appearance: Casual  Eye Contact:  Good  Speech:  Clear and Coherent and Normal Rate  Volume:  Normal  Mood:mild   anxious   Affect:  More appropriate    Thought Process:  Goal Directed, Linear and Logical  Orientation:  Full (Time, Place, and Person)  Thought Content:  WDL  Suicidal Thoughts:  No  Homicidal Thoughts:  No  Memory:  Immediate is good, recent and remote memory is good   Judgement:  Good   Insight:  Fair  Psychomotor Activity:  Normal  Concentration:  Fair  Recall:  Good   Fund of Knowledge:Fair  Language: Good  Akathisia:  No  Handed:  Right  AIMS (if indicated):  0  Assets:  Communication Skills Desire for Improvement Housing Resilience Social Support Transportation  ADL's:  Intact  Cognition: WNL  Sleep:     Is the patient at risk to self?  No. Has the patient been a risk to self in  the past 6 months?  Yes.   Has the patient been a risk to self within the distant past?  No. Is the patient a risk to others?  No. Has the patient been a risk to others in the past 6 months?  No. Has the patient been a risk to others within the distant past?  No.  Allergies:   reviewed as listed below  Allergies  Allergen Reactions  . Ciprofloxacin     GI side effects   . Penicillins Other (See Comments)    Childhood allergy. Knot on arm Has patient had a PCN reaction causing immediate rash, facial/tongue/throat swelling, SOB or lightheadedness with hypotension: no Has patient had a PCN reaction causing severe rash involving mucus membranes or skin necrosis: no Has patient had a PCN reaction that required hospitalization: dr's office visit Has patient had a PCN reaction occurring within the last 10 years: yes If all of the above answers are "NO", then may pro  . Zocor [Simvastatin]     Back aches   . Hydrocodone Itching    consitpation   Current Medications:reviewed as listed below  Current Outpatient Prescriptions  Medication Sig Dispense Refill  . acetaminophen (TYLENOL) 500 MG tablet Take 500 mg by mouth every 6 (six) hours as needed for moderate pain or headache.    Marland Kitchen buPROPion (WELLBUTRIN XL) 300 MG 24 hr  tablet Take 1 tablet (300 mg total) by mouth daily. 30 tablet 5  . cycloSPORINE (RESTASIS) 0.05 % ophthalmic emulsion Place 1 drop into both eyes 2 (two) times daily.     Marland Kitchen doxazosin (CARDURA) 8 MG tablet Take 0.5 tablets (4 mg total) by mouth daily. (Patient taking differently: Take 4 mg by mouth every evening. )    . Multiple Vitamins-Minerals (MULTIVITAMIN WITH MINERALS) tablet Take 1 tablet by mouth daily.    Marland Kitchen POTASSIUM CHLORIDE PO Take 99 mg by mouth every evening.     . pyridOXINE (VITAMIN B-6) 50 MG tablet Take 100 mg by mouth daily.     . risperiDONE (RISPERDAL) 0.25 MG tablet Take 1 tablet (0.25 mg total) by mouth at bedtime. 2  qhs 30 tablet 3  . silodosin (RAPAFLO) 8 MG CAPS capsule Take 8 mg by mouth daily with breakfast.    . traMADol (ULTRAM) 50 MG tablet Take 1-2 tablets (50-100 mg total) by mouth every 6 (six) hours as needed for moderate pain or severe pain. 20 tablet 0  . traZODone (DESYREL) 100 MG tablet 1 qhs  May increase to 2  qhs (Patient taking differently: Take 200 mg by mouth at bedtime. 1 qhs  May increase to 2  qhs) 60 tablet 4  . TURMERIC PO Take 1 tablet by mouth daily.     . vitamin B-12 (CYANOCOBALAMIN) 100 MCG tablet Take 100 mcg by mouth daily.     No current facility-administered medications for this visit.     Previous Psychotropic Medications: Yes     Medical Decision Making At this time we will go ahead and discontinue his Risperdal. The only medicine no continue to take his Wellbutrin 300 mg XL each morning. Is not on any sleeping aids. The patient is medically very well. We reviewed all his medications and her purposes. This patient to return to see me in 3 months and at that time if he is well we'll consider transferring his care to his primary care doctor which is what this patient wishes. He is very stable this time. He was instructed to call  us if there are any problems.

## 2017-01-05 DIAGNOSIS — Z0000 Encounter for general adult medical examination without abnormal findings: Secondary | ICD-10-CM | POA: Diagnosis not present

## 2017-01-05 DIAGNOSIS — I10 Essential (primary) hypertension: Secondary | ICD-10-CM | POA: Diagnosis not present

## 2017-01-05 DIAGNOSIS — C20 Malignant neoplasm of rectum: Secondary | ICD-10-CM | POA: Diagnosis not present

## 2017-01-05 DIAGNOSIS — E785 Hyperlipidemia, unspecified: Secondary | ICD-10-CM | POA: Diagnosis not present

## 2017-01-05 DIAGNOSIS — R7309 Other abnormal glucose: Secondary | ICD-10-CM | POA: Diagnosis not present

## 2017-01-05 DIAGNOSIS — Z87438 Personal history of other diseases of male genital organs: Secondary | ICD-10-CM | POA: Diagnosis not present

## 2017-01-05 DIAGNOSIS — R7301 Impaired fasting glucose: Secondary | ICD-10-CM | POA: Diagnosis not present

## 2017-01-12 DIAGNOSIS — G4733 Obstructive sleep apnea (adult) (pediatric): Secondary | ICD-10-CM | POA: Diagnosis not present

## 2017-01-13 ENCOUNTER — Other Ambulatory Visit (HOSPITAL_COMMUNITY): Payer: Self-pay | Admitting: Psychiatry

## 2017-01-16 DIAGNOSIS — G4733 Obstructive sleep apnea (adult) (pediatric): Secondary | ICD-10-CM | POA: Diagnosis not present

## 2017-01-16 DIAGNOSIS — M129 Arthropathy, unspecified: Secondary | ICD-10-CM | POA: Diagnosis not present

## 2017-01-19 ENCOUNTER — Other Ambulatory Visit (HOSPITAL_COMMUNITY): Payer: Self-pay

## 2017-01-19 MED ORDER — TRAZODONE HCL 100 MG PO TABS: ORAL_TABLET | ORAL | 2 refills | Status: AC

## 2017-02-16 DIAGNOSIS — G4733 Obstructive sleep apnea (adult) (pediatric): Secondary | ICD-10-CM | POA: Diagnosis not present

## 2017-02-16 DIAGNOSIS — M129 Arthropathy, unspecified: Secondary | ICD-10-CM | POA: Diagnosis not present

## 2017-03-16 DIAGNOSIS — G4733 Obstructive sleep apnea (adult) (pediatric): Secondary | ICD-10-CM | POA: Diagnosis not present

## 2017-03-16 DIAGNOSIS — M129 Arthropathy, unspecified: Secondary | ICD-10-CM | POA: Diagnosis not present

## 2017-03-24 DIAGNOSIS — F958 Other tic disorders: Secondary | ICD-10-CM | POA: Diagnosis not present

## 2017-03-24 DIAGNOSIS — F419 Anxiety disorder, unspecified: Secondary | ICD-10-CM | POA: Diagnosis not present

## 2017-03-24 DIAGNOSIS — G43709 Chronic migraine without aura, not intractable, without status migrainosus: Secondary | ICD-10-CM | POA: Diagnosis not present

## 2017-03-29 DIAGNOSIS — G4733 Obstructive sleep apnea (adult) (pediatric): Secondary | ICD-10-CM | POA: Diagnosis not present

## 2017-03-31 ENCOUNTER — Ambulatory Visit (INDEPENDENT_AMBULATORY_CARE_PROVIDER_SITE_OTHER): Payer: PPO | Admitting: Psychiatry

## 2017-03-31 VITALS — BP 137/79 | HR 80 | Ht 69.0 in | Wt 273.0 lb

## 2017-03-31 DIAGNOSIS — F321 Major depressive disorder, single episode, moderate: Secondary | ICD-10-CM | POA: Diagnosis not present

## 2017-03-31 DIAGNOSIS — Z810 Family history of intellectual disabilities: Secondary | ICD-10-CM | POA: Diagnosis not present

## 2017-03-31 DIAGNOSIS — Z79899 Other long term (current) drug therapy: Secondary | ICD-10-CM | POA: Diagnosis not present

## 2017-03-31 NOTE — Progress Notes (Signed)
Patient ID: SAVA PROBY, male   DOB: Jan 13, 1951, 66 y.o.   MRN: [id number] [facility] MD Progress Note.  Patient Identification: NIEL PERETTI MRN:  [id number]  Subjective-   I am fine    Visit Diagnosis:   No diagnosis found. Diagnosis:   Patient Active Problem List   Diagnosis Date Noted  . Obesity (BMI 30-39.9) [E66.9] 07/31/2016  . Hypertension [I10]   . Enlarged prostate [N40.0]   . Sleep apnea [G47.30]   . 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 OCD (obsessive compulsive disorder) [F42.9] 12/31/2015  . Memory changes [R41.3] 12/27/2015  . Generalized anxiety disorder [F41.1] 11/12/2015  . MDD (major depressive disorder), recurrent severe, without psychosis ([place]) [F33.2] 11/02/2015  . Panic attacks [F41.0] 11/02/2015  . Arthritis of knee, right [M17.11] 02/23/2014    Class: Chronic   History of Present Illness:  Today the patient is doing very well. He's done well off Risperdal. Just takes Wellbutrin 150 mg slow release twice a day. Since I've seen him he went to primary care doctor who started him on Zoloft 50 mg a small dose of Xanax. She seems to imply that she started it on him because he has some obsessive-compulsive behaviors for short period of time during the year. I do not think he has OCD. I don't think a drivers indicated at this time. Nonetheless her plan was that he was doing well he is he would transfer his care to his primary care doctor Dr. Sanjuana Kava. At this time the patient denies any depression. He sleeping and eating well. He is no vegetative symptoms. These mannerisms he has with his tongue and his teeth he has only one is extremely anxious. He had when he went to visit his elderly mother. I think a reasonable thing to do is offer Xanax as his primary care doctor did offer. I don't is anything wrong or dangerous Zoloft and Xanax patient is not psychotic. He is not using alcohol or drugs. Medically he is very  stable. Is happy with life. He is no significant psychosocial stressors  Substance Abuse History in the last 12 months:  Yes.   patient's drinks 1-2 glasses of wine occasionally he is a social drinker.  Consequences of Substance Abuse: NA   Past psychiatric history.---------- Patient was hospitalized 20 years ago at [facility] psychiatric unit because of leaving his first wife to marry his current wife and felt guilty about it. No outpatient follow-up. Hospitalized at [facility] adult unit at [facility] from 11/01/2015 01/31/1659   Past Medical History: Reviewed as listed below Past Medical History:  Diagnosis Date  . Anxiety    TAKES XANAX DAILY AS NEEDED  . Arthritis   . Cataracts, bilateral   . Depression    TAKES WELLBUTRIN DAILY  . Enlarged prostate   . Headache(784.0)    OCCASIONALLY  . Hyperlipidemia    TAKES LOVASTATIN NIGHTLY  . Hypertension    TAKES CARDURA DAILY  . Joint pain   . MDD (major depressive disorder), recurrent severe, without psychosis ([place]) 11/02/2015  . Panic attacks 11/02/2015  . Sleep apnea    USES CPAP, PT DOES NOT KNOW SETTINGS  . Urinary frequency   . Urinary urgency     Past Surgical History:  Procedure Laterality Date  . BENIGN TUMOR REMOVED FROM CHEST    . CATARACT EXTRACTION Bilateral   . EUS N/A 05/21/2016   Procedure: LOWER ENDOSCOPIC ULTRASOUND (EUS);  Surgeon:  Arta Silence, MD;  Location[facility] ENDOSCOPY;  Service: Endoscopy;  Laterality: N/A;  . FLEXIBLE SIGMOIDOSCOPY N/A 05/21/2016   Procedure: FLEXIBLE SIGMOIDOSCOPY;  Surgeon: Arta Silence, MD;  Location: [facility] ENDOSCOPY;  Service: Endoscopy;  Laterality: N/A;  . KNEE ARTHROSCOPY Left 02/23/2014   Procedure: LEFT ARTHROSCOPY KNEE;  Surgeon: Kerin Salen, MD;  Location: [place];  Service: Orthopedics;  Laterality: Left;  . PARTIAL PROCTECTOMY BY TEM N/A 07/30/2016   Procedure: PARTIAL PROCTECTOMY BY TEM WITH EXAM UNDER ANESTHESIA;  Surgeon: Michael Boston, MD;  Location: [facility];  Service:  General;  Laterality: N/A;  . TOTAL KNEE ARTHROPLASTY Right 02/23/2014   Procedure: TOTAL KNEE ARTHROPLASTY;  Surgeon: Kerin Salen, MD;  Location: [place];  Service: Orthopedics;  Laterality: Right;   Family History: Reviewed as listed below Family History  Problem Relation Age of Onset  . Hypertension Mother   . Heart attack Mother   . Osteoporosis Mother   . CVA Mother   . Coronary artery disease Mother   . Hypertension Father   . Emphysema Father   . Dementia Father     father tried to commit suicide when he had dementia  . Coronary artery disease Brother   . Diabetes Brother    Social History:  Patient lives with his wife in [place]  . Marital status: Married    Spouse name: N/A  . Number of children: 2  . Years of education: N/A   Occupational History  . Not Working    Social History Main Topics  . Smoking status: Never Smoker  . Smokeless tobacco: Never Used  . Alcohol use 0.6 oz/week    1 Cans of beer per week     Comment: Rare  . Drug use: No  . Sexual activity: Not Currently   Other Topics Concern  . Not on file   Social History Narrative  . No narrative on file     Musculoskeletal: Strength & Muscle Tone: within normal limits Gait & Station: normal Patient leans: N/A  Psychiatric Specialty Exam: HPI  Review of Systems  Constitutional: Negative.   HENT: Negative.   Eyes: Negative.   Respiratory: Negative.   Cardiovascular: Negative.   Gastrointestinal: Negative.   Genitourinary: Negative.   Musculoskeletal: Negative.   Skin: Negative.   Neurological: Negative.   Endo/Heme/Allergies: Negative.   Psychiatric/Behavioral: Positive for depression. Negative for memory loss. The patient is nervous/anxious.    being frankly and she is   Blood pressure 137/79, pulse 80, height 5\' 9"  (1.753 m), weight 273 lb (123.8 kg).Body mass index is 40.32 kg/m.  General Appearance: Casual  Eye Contact:  Good  Speech:  Clear  and Coherent and Normal Rate  Volume:  Normal  Mood:mild   anxious   Affect:  More appropriate   Thought Process:  Goal Directed, Linear and Logical  Orientation:  Full (Time, Place, and Person)  Thought Content:  WDL  Suicidal Thoughts:  No  Homicidal Thoughts:  No  Memory:  Immediate is good, recent and remote memory is good   Judgement:  Good   Insight:  Fair  Psychomotor Activity:  Normal  Concentration:  Fair  Recall:  Good   Fund of Knowledge:Fair  Language: Good  Akathisia:  No  Handed:  Right  AIMS (if indicated):  0  Assets:  Communication Skills Desire for Improvement Housing Resilience Social Support Transportation  ADL's:  Intact  Cognition: WNL  Sleep:  Is the patient at risk to self?  No. Has the patient been a risk to self in the past 6 months?  Yes.   Has the patient been a risk to self within the distant past?  No. Is the patient a risk to others?  No. Has the patient been a risk to others in the past 6 months?  No. Has the patient been a risk to others within the distant past?  No.  Allergies:   reviewed as listed below  Allergies  Allergen Reactions  . Ciprofloxacin     GI side effects   . Penicillins Other (See Comments)    Childhood allergy. Knot on arm Has patient had a PCN reaction causing immediate rash, facial/tongue/throat swelling, SOB or lightheadedness with hypotension: no Has patient had a PCN reaction causing severe rash involving mucus membranes or skin necrosis: no Has patient had a PCN reaction that required hospitalization: dr's office visit Has patient had a PCN reaction occurring within the last 10 years: yes If all of the above answers are "NO", then may pro  . Zocor [Simvastatin]     Back aches   . Hydrocodone Itching    consitpation   Current Medications:reviewed as listed below  Current Outpatient Prescriptions  Medication Sig Dispense Refill  . acetaminophen (TYLENOL) 500 MG tablet Take 500 mg by mouth every 6  (six) hours as needed for moderate pain or headache.    Marland Kitchen buPROPion (WELLBUTRIN XL) 300 MG 24 hr tablet Take 1 tablet (300 mg total) by mouth daily. 30 tablet 5  . cycloSPORINE (RESTASIS) 0.05 % ophthalmic emulsion Place 1 drop into both eyes 2 (two) times daily.     Marland Kitchen doxazosin (CARDURA) 8 MG tablet Take 0.5 tablets (4 mg total) by mouth daily. (Patient taking differently: Take 4 mg by mouth every evening. )    . Multiple Vitamins-Minerals (MULTIVITAMIN WITH MINERALS) tablet Take 1 tablet by mouth daily.    Marland Kitchen POTASSIUM CHLORIDE PO Take 99 mg by mouth every evening.     . pyridOXINE (VITAMIN B-6) 50 MG tablet Take 100 mg by mouth daily.     . risperiDONE (RISPERDAL) 0.25 MG tablet Take 1 tablet (0.25 mg total) by mouth at bedtime. 2  qhs 30 tablet 3  . silodosin (RAPAFLO) 8 MG CAPS capsule Take 8 mg by mouth daily with breakfast.    . traMADol (ULTRAM) 50 MG tablet Take 1-2 tablets (50-100 mg total) by mouth every 6 (six) hours as needed for moderate pain or severe pain. 20 tablet 0  . traZODone (DESYREL) 100 MG tablet 1 qhs  May use 2  Qhs as needed 60 tablet 2  . TURMERIC PO Take 1 tablet by mouth daily.     . vitamin B-12 (CYANOCOBALAMIN) 100 MCG tablet Take 100 mcg by mouth daily.     No current facility-administered medications for this visit.     Previous Psychotropic Medications: Yes     Medical Decision Making Today the patient is doing well. He had a history of significant depression about 2 years ago the lateral 1 week hospitalization. Is not clear why he was put on an antipsychotic medicine but over time we've slowly reduce to discontinued months ago. He's noted no problems all. He continue taking Wellbutrin and my recommendation is that he continues take indefinitely. He agrees with this recommendation. When his last visit he brought up the issue could get his medications from his primary care doctor. I told him if he returns  in his doing well exact removal today we will entered  therapy. His primary care doctor begin on a low-dose of Xanax and Zoloft and she'll continue supplying those medications. The patient is very stable. The patient was told to always be under my care and he can always return if anything changes. We will not offer a return visit at this time. The patient is in agreement with this plan and agrees to call if there are any changes.

## 2017-04-15 DIAGNOSIS — G4733 Obstructive sleep apnea (adult) (pediatric): Secondary | ICD-10-CM | POA: Diagnosis not present

## 2017-04-15 DIAGNOSIS — M129 Arthropathy, unspecified: Secondary | ICD-10-CM | POA: Diagnosis not present

## 2017-04-16 DIAGNOSIS — M129 Arthropathy, unspecified: Secondary | ICD-10-CM | POA: Diagnosis not present

## 2017-04-16 DIAGNOSIS — G4733 Obstructive sleep apnea (adult) (pediatric): Secondary | ICD-10-CM | POA: Diagnosis not present

## 2017-04-20 DIAGNOSIS — G4733 Obstructive sleep apnea (adult) (pediatric): Secondary | ICD-10-CM | POA: Diagnosis not present

## 2017-04-20 DIAGNOSIS — M129 Arthropathy, unspecified: Secondary | ICD-10-CM | POA: Diagnosis not present

## 2017-04-27 DIAGNOSIS — F958 Other tic disorders: Secondary | ICD-10-CM | POA: Diagnosis not present

## 2017-04-27 DIAGNOSIS — F410 Panic disorder [episodic paroxysmal anxiety] without agoraphobia: Secondary | ICD-10-CM | POA: Diagnosis not present

## 2017-04-27 DIAGNOSIS — G44209 Tension-type headache, unspecified, not intractable: Secondary | ICD-10-CM | POA: Diagnosis not present

## 2017-04-27 DIAGNOSIS — F419 Anxiety disorder, unspecified: Secondary | ICD-10-CM | POA: Diagnosis not present

## 2017-04-27 DIAGNOSIS — G4733 Obstructive sleep apnea (adult) (pediatric): Secondary | ICD-10-CM | POA: Diagnosis not present

## 2017-05-16 DIAGNOSIS — G4733 Obstructive sleep apnea (adult) (pediatric): Secondary | ICD-10-CM | POA: Diagnosis not present

## 2017-05-16 DIAGNOSIS — M129 Arthropathy, unspecified: Secondary | ICD-10-CM | POA: Diagnosis not present

## 2017-05-24 DIAGNOSIS — R05 Cough: Secondary | ICD-10-CM | POA: Diagnosis not present

## 2017-06-09 DIAGNOSIS — M150 Primary generalized (osteo)arthritis: Secondary | ICD-10-CM | POA: Diagnosis not present

## 2017-06-09 DIAGNOSIS — Z6841 Body Mass Index (BMI) 40.0 and over, adult: Secondary | ICD-10-CM | POA: Diagnosis not present

## 2017-06-09 DIAGNOSIS — Z79899 Other long term (current) drug therapy: Secondary | ICD-10-CM | POA: Diagnosis not present

## 2017-06-09 DIAGNOSIS — M2550 Pain in unspecified joint: Secondary | ICD-10-CM | POA: Diagnosis not present

## 2017-06-09 DIAGNOSIS — M3500 Sicca syndrome, unspecified: Secondary | ICD-10-CM | POA: Diagnosis not present

## 2017-06-16 DIAGNOSIS — G4733 Obstructive sleep apnea (adult) (pediatric): Secondary | ICD-10-CM | POA: Diagnosis not present

## 2017-06-16 DIAGNOSIS — M129 Arthropathy, unspecified: Secondary | ICD-10-CM | POA: Diagnosis not present

## 2017-06-18 DIAGNOSIS — R351 Nocturia: Secondary | ICD-10-CM | POA: Diagnosis not present

## 2017-06-18 DIAGNOSIS — R3915 Urgency of urination: Secondary | ICD-10-CM | POA: Diagnosis not present

## 2017-07-16 DIAGNOSIS — M129 Arthropathy, unspecified: Secondary | ICD-10-CM | POA: Diagnosis not present

## 2017-07-16 DIAGNOSIS — G4733 Obstructive sleep apnea (adult) (pediatric): Secondary | ICD-10-CM | POA: Diagnosis not present

## 2017-07-23 ENCOUNTER — Other Ambulatory Visit (HOSPITAL_COMMUNITY): Payer: Self-pay | Admitting: Psychiatry

## 2017-08-02 DIAGNOSIS — G4733 Obstructive sleep apnea (adult) (pediatric): Secondary | ICD-10-CM | POA: Diagnosis not present

## 2017-08-02 DIAGNOSIS — M129 Arthropathy, unspecified: Secondary | ICD-10-CM | POA: Diagnosis not present

## 2017-08-09 DIAGNOSIS — Z23 Encounter for immunization: Secondary | ICD-10-CM | POA: Diagnosis not present

## 2017-08-09 DIAGNOSIS — F5101 Primary insomnia: Secondary | ICD-10-CM | POA: Diagnosis not present

## 2017-08-09 DIAGNOSIS — S39012A Strain of muscle, fascia and tendon of lower back, initial encounter: Secondary | ICD-10-CM | POA: Diagnosis not present

## 2017-08-16 DIAGNOSIS — G4733 Obstructive sleep apnea (adult) (pediatric): Secondary | ICD-10-CM | POA: Diagnosis not present

## 2017-08-16 DIAGNOSIS — M129 Arthropathy, unspecified: Secondary | ICD-10-CM | POA: Diagnosis not present

## 2017-09-11 ENCOUNTER — Emergency Department (HOSPITAL_COMMUNITY): Payer: PPO

## 2017-09-11 ENCOUNTER — Emergency Department (HOSPITAL_COMMUNITY)
Admission: EM | Admit: 2017-09-11 | Discharge: 2017-09-11 | Disposition: A | Discharge status: Home / Self Care | Payer: PPO | Attending: Emergency Medicine | Admitting: Emergency Medicine

## 2017-09-11 ENCOUNTER — Encounter (HOSPITAL_COMMUNITY): Payer: Self-pay | Admitting: Emergency Medicine

## 2017-09-11 DIAGNOSIS — M545 Low back pain: Secondary | ICD-10-CM | POA: Diagnosis not present

## 2017-09-11 DIAGNOSIS — W01198A Fall on same level from slipping, tripping and stumbling with subsequent striking against other object, initial encounter: Secondary | ICD-10-CM | POA: Insufficient documentation

## 2017-09-11 DIAGNOSIS — Y929 Unspecified place or not applicable: Secondary | ICD-10-CM | POA: Insufficient documentation

## 2017-09-11 DIAGNOSIS — S3992XA Unspecified injury of lower back, initial encounter: Secondary | ICD-10-CM | POA: Diagnosis present

## 2017-09-11 DIAGNOSIS — W19XXXA Unspecified fall, initial encounter: Secondary | ICD-10-CM

## 2017-09-11 DIAGNOSIS — Y9389 Activity, other specified: Secondary | ICD-10-CM | POA: Diagnosis not present

## 2017-09-11 DIAGNOSIS — Y999 Unspecified external cause status: Secondary | ICD-10-CM | POA: Insufficient documentation

## 2017-09-11 DIAGNOSIS — I10 Essential (primary) hypertension: Secondary | ICD-10-CM | POA: Diagnosis not present

## 2017-09-11 DIAGNOSIS — Z79899 Other long term (current) drug therapy: Secondary | ICD-10-CM | POA: Diagnosis not present

## 2017-09-11 DIAGNOSIS — Z96651 Presence of right artificial knee joint: Secondary | ICD-10-CM | POA: Insufficient documentation

## 2017-09-11 DIAGNOSIS — S32010A Wedge compression fracture of first lumbar vertebra, initial encounter for closed fracture: Secondary | ICD-10-CM | POA: Insufficient documentation

## 2017-09-11 MED ORDER — OXYCODONE-ACETAMINOPHEN 5-325 MG PO TABS: 1.0000 | ORAL_TABLET | Freq: Four times a day (QID) | ORAL | 0 refills | Status: AC | PRN

## 2017-09-11 MED ORDER — DICLOFENAC SODIUM 50 MG PO TBEC: 50.0000 mg | DELAYED_RELEASE_TABLET | Freq: Two times a day (BID) | ORAL | 0 refills | Status: DC

## 2017-09-11 MED ORDER — KETOROLAC TROMETHAMINE 60 MG/2ML IM SOLN
30.0000 mg | Freq: Once | INTRAMUSCULAR | Status: AC
  Administered 2017-09-11: 30 mg via INTRAMUSCULAR
  Filled 2017-09-11: qty 2

## 2017-09-11 MED ORDER — OXYCODONE-ACETAMINOPHEN 5-325 MG PO TABS
1.0000 | ORAL_TABLET | Freq: Once | ORAL | Status: AC
  Administered 2017-09-11: 1 via ORAL
  Filled 2017-09-11: qty 1

## 2017-09-11 NOTE — Discharge Instructions (Signed)
You may continue to take the muscle relaxant with the medication we give you if needed.  Call the orthopedic doctor for follow up on Monday.

## 2017-09-11 NOTE — ED Provider Notes (Signed)
Medical screening examination/treatment/procedure(s) were conducted as a shared visit with non-physician practitioner(s) and myself.  I personally evaluated the patient during the encounter.   EKG Interpretation None     66 year old male here for mechanical fall complains of low back pain. Acute L1 fracture noted. He is neurologically intact. We'll medicate for pain and felt given   Lacretia Leigh, MD 09/11/17 2002

## 2017-09-11 NOTE — ED Triage Notes (Signed)
Patient reports fall after missing a step getting out of a trailer. C/o back pain. Reports pain worsens with movement. Denies head injury and LOC. Reports taking muscle relaxer without relief.

## 2017-09-11 NOTE — ED Notes (Signed)
Brace provided by Sheliah Hatch.

## 2017-09-11 NOTE — ED Provider Notes (Signed)
[facility] DEPT Provider Note   CSN: [id number] Arrival date & time: 09/11/17  1523     History   Chief Complaint Chief Complaint  Patient presents with  . Fall    HPI Brad Miller is a 66 y.o. male who presents to the ED with back pain. Patient reports that he was putting his pressure washer and trailer up and missed a step and fell backward. He hit his back on the pavement. He has had difficulty walking since the accident happened at 10 this morning.     The history is provided by the patient. No language interpreter was used.  Fall  This is a new problem. The current episode started 3 to 5 hours ago. Pertinent negatives include no chest pain, no abdominal pain, no headaches and no shortness of breath.  Back Pain   This is a new problem. The current episode started 3 to 5 hours ago. The problem occurs constantly. The pain is associated with falling. The pain is present in the lumbar spine. The quality of the pain is described as burning, aching and shooting. The pain does not radiate. The pain is at a severity of 9/10. The symptoms are aggravated by bending, twisting and certain positions. Pertinent negatives include no chest pain, no fever, no numbness, no headaches, no abdominal pain, no bowel incontinence, no bladder incontinence and no tingling. He has tried muscle relaxants and NSAIDs for the symptoms.    Past Medical History:  Diagnosis Date  . Anxiety    TAKES XANAX DAILY AS NEEDED  . Arthritis   . Cataracts, bilateral   . Depression    TAKES WELLBUTRIN DAILY  . Enlarged prostate   . Headache(784.0)    OCCASIONALLY  . Hyperlipidemia    TAKES LOVASTATIN NIGHTLY  . Hypertension    TAKES CARDURA DAILY  . Joint pain   . MDD (major depressive disorder), recurrent severe, without psychosis ([place]) 11/02/2015  . Panic attacks 11/02/2015  . Sleep apnea    USES CPAP, PT DOES NOT KNOW SETTINGS  . Urinary frequency   . Urinary urgency     Patient Active Problem List    Diagnosis Date Noted  . Obesity (BMI 30-39.9) 07/31/2016  . Hypertension   . Enlarged prostate   . Sleep apnea   . 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 OCD (obsessive compulsive disorder) 12/31/2015  . Memory changes 12/27/2015  . Generalized anxiety disorder 11/12/2015  . MDD (major depressive disorder), recurrent severe, without psychosis ([place]) 11/02/2015  . Panic attacks 11/02/2015  . Arthritis of knee, right 02/23/2014    Class: Chronic    Past Surgical History:  Procedure Laterality Date  . BENIGN TUMOR REMOVED FROM CHEST    . CATARACT EXTRACTION Bilateral   . EUS N/A 05/21/2016   Procedure: LOWER ENDOSCOPIC ULTRASOUND (EUS);  Surgeon: Arta Silence, MD;  Location: [facility] ENDOSCOPY;  Service: Endoscopy;  Laterality: N/A;  . FLEXIBLE SIGMOIDOSCOPY N/A 05/21/2016   Procedure: FLEXIBLE SIGMOIDOSCOPY;  Surgeon: Arta Silence, MD;  Location: [facility] ENDOSCOPY;  Service: Endoscopy;  Laterality: N/A;  . KNEE ARTHROSCOPY Left 02/23/2014   Procedure: LEFT ARTHROSCOPY KNEE;  Surgeon: Kerin Salen, MD;  Location: [place];  Service: Orthopedics;  Laterality: Left;  . PARTIAL PROCTECTOMY BY TEM N/A 07/30/2016   Procedure: PARTIAL PROCTECTOMY BY TEM WITH EXAM UNDER ANESTHESIA;  Surgeon: Michael Boston, MD;  Location: [facility];  Service: General;  Laterality: N/A;  . TOTAL KNEE ARTHROPLASTY Right  02/23/2014   Procedure: TOTAL KNEE ARTHROPLASTY;  Surgeon: Kerin Salen, MD;  Location: [place];  Service: Orthopedics;  Laterality: Right;       Home Medications    Prior to Admission medications   Medication Sig Start Date End Date Taking? Authorizing Provider  acetaminophen (TYLENOL) 500 MG tablet Take 500 mg by mouth every 6 (six) hours as needed for moderate pain or headache.    [provider]  buPROPion (WELLBUTRIN XL) 300 MG 24 hr tablet Take 1 tablet (300 mg total) by mouth daily. 12/30/16   Plovsky, Berneta Sages, MD  cycloSPORINE (RESTASIS) 0.05 %  ophthalmic emulsion Place 1 drop into both eyes 2 (two) times daily.     [provider]  diclofenac (VOLTAREN) 50 MG EC tablet Take 1 tablet (50 mg total) by mouth 2 (two) times daily. 09/11/17   Ashley Murrain, NP  doxazosin (CARDURA) 8 MG tablet Take 0.5 tablets (4 mg total) by mouth daily. Patient taking differently: Take 4 mg by mouth every evening.  11/06/15   Withrow, Elyse Jarvis, FNP  Multiple Vitamins-Minerals (MULTIVITAMIN WITH MINERALS) tablet Take 1 tablet by mouth daily.    [provider]  oxyCODONE-acetaminophen (PERCOCET/ROXICET) 5-325 MG tablet Take 1 tablet by mouth every 6 (six) hours as needed for severe pain. 09/11/17   Ashley Murrain, NP  POTASSIUM CHLORIDE PO Take 99 mg by mouth every evening.     [provider]  pyridOXINE (VITAMIN B-6) 50 MG tablet Take 100 mg by mouth daily.     [provider]  risperiDONE (RISPERDAL) 0.25 MG tablet Take 1 tablet (0.25 mg total) by mouth at bedtime. 2  qhs 09/30/16   Plovsky, Berneta Sages, MD  silodosin (RAPAFLO) 8 MG CAPS capsule Take 8 mg by mouth daily with breakfast.    [provider]  traMADol (ULTRAM) 50 MG tablet Take 1-2 tablets (50-100 mg total) by mouth every 6 (six) hours as needed for moderate pain or severe pain. 07/30/16   Michael Boston, MD  traZODone (DESYREL) 100 MG tablet 1 qhs  May use 2  Qhs as needed 01/19/17   Norma Fredrickson, MD  TURMERIC PO Take 1 tablet by mouth daily.     [provider]  vitamin B-12 (CYANOCOBALAMIN) 100 MCG tablet Take 100 mcg by mouth daily.    [provider]    Family History Family History  Problem Relation Age of Onset  . Hypertension Mother   . Heart attack Mother   . Osteoporosis Mother   . CVA Mother   . Coronary artery disease Mother   . Hypertension Father   . Emphysema Father   . Dementia Father        father tried to commit suicide when he had dementia  . Coronary artery disease Brother   . Diabetes Brother     Social  History Social History  Substance Use Topics  . Smoking status: Never Smoker  . Smokeless tobacco: Never Used  . Alcohol use 0.6 oz/week    1 Cans of beer per week     Comment: Rare     Allergies   Ciprofloxacin; Penicillins; Zocor [simvastatin]; and Hydrocodone   Review of Systems Review of Systems  Constitutional: Negative for chills and fever.  HENT: Negative.   Eyes: Negative for visual disturbance.  Respiratory: Negative for shortness of breath.   Cardiovascular: Negative for chest pain.  Gastrointestinal: Negative for abdominal pain, bowel incontinence, nausea and vomiting.  Genitourinary: Negative for bladder  incontinence.       No loss of control of bladder or bowels.  Musculoskeletal: Positive for back pain. Negative for neck pain.  Skin: Negative for wound.  Neurological: Negative for tingling, syncope, numbness and headaches.  Psychiatric/Behavioral: Negative for confusion.     Physical Exam Updated Vital Signs BP 117/65 (BP Location: Left Arm)   Pulse 68   Temp 97.7 F (36.5 C) (Oral)   Resp 19   SpO2 98%   Physical Exam  Constitutional: He is oriented to person, place, and time. He appears well-developed and well-nourished. No distress.  HENT:  Head: Normocephalic and atraumatic.  Eyes: Pupils are equal, round, and reactive to light. EOM are normal.  Neck: Normal range of motion. Neck supple.  Cardiovascular: Normal rate and regular rhythm.   Pulmonary/Chest: Effort normal. No respiratory distress. He has no wheezes. He has no rales.  Abdominal: Soft. Bowel sounds are normal. There is no tenderness.  Musculoskeletal:       Lumbar back: He exhibits tenderness, bony tenderness and spasm. He exhibits normal range of motion, no deformity and normal pulse.       Back:  Neurological: He is alert and oriented to person, place, and time. He has normal strength. No sensory deficit. Gait normal.  Reflex Scores:      Bicep reflexes are 2+ on the right side  and 2+ on the left side.      Brachioradialis reflexes are 2+ on the right side and 2+ on the left side.      Patellar reflexes are 2+ on the right side and 2+ on the left side. Pain with ambulation  Skin: Skin is warm and dry.  Psychiatric: He has a normal mood and affect. His behavior is normal.  Nursing note and vitals reviewed.    ED Treatments / Results  Labs (all labs ordered are listed, but only abnormal results are displayed) Labs Reviewed - No data to display  Radiology Ct Lumbar Spine Wo Contrast  Result Date: 09/11/2017 CLINICAL DATA:  Fall with lower back pain. EXAM: CT LUMBAR SPINE WITHOUT CONTRAST TECHNIQUE: Multidetector CT imaging of the lumbar spine was performed without intravenous contrast administration. Multiplanar CT image reconstructions were also generated. COMPARISON:  05/19/2016 CT abdomen and pelvis. FINDINGS: Segmentation: 5 lumbar type vertebrae. Alignment: Stable mild straightening of lower lumbar lordosis and minimal grade 1 C3-4 anterolisthesis. Vertebrae: Acute L1 superior endplate fracture greater on the right with minimal loss of vertebral body height and no retropulsion (series 4, image 22 and series 5, image 27). No other fracture identified. Paraspinal and other soft tissues: Mild prevertebral edema at the L1 level. Mild calcific atherosclerosis of abdominal aorta. Disc levels: Stable mild discogenic degenerative changes and advanced facet arthropathy of the lumbar spine. No high-grade foraminal or canal stenosis identified. IMPRESSION: Acute L1 superior endplate fracture, greater on the right, with minimal loss of vertebral body height. No bony retropulsion. No malalignment. Electronically Signed   By: Kristine Garbe M.D.   On: 09/11/2017 18:33    Procedures Procedures (including critical care time)  Medications Ordered in ED Medications  oxyCODONE-acetaminophen (PERCOCET/ROXICET) 5-325 MG per tablet 1 tablet (1 tablet Oral Given 09/11/17  1810)  ketorolac (TORADOL) injection 30 mg (30 mg Intramuscular Given 09/11/17 1812)     Initial Impression / Assessment and Plan / ED Course  I have reviewed the triage vital signs and the nursing notes.  Final Clinical Impressions(s) / ED Diagnoses   Final diagnoses:  Closed compression fracture  of first lumbar vertebra, initial encounter ([place])  Fall, initial encounter  66 y.o. male with lower back pain s/p fall and x-rays that show L1 fracture stable for d/c to f/u with ortho. No neuro deficits. Back brace given prior to d/c, pain managed in the ED. Discussed findings with the patient and plan of care and he agrees with plan.  Dr. Zenia Resides in to examine the patient and discuss follow up.   New Prescriptions Discharge Medication List as of 09/11/2017  8:09 PM    START taking these medications   Details  diclofenac (VOLTAREN) 50 MG EC tablet Take 1 tablet (50 mg total) by mouth 2 (two) times daily., Starting Sat 09/11/2017, Print    oxyCODONE-acetaminophen (PERCOCET/ROXICET) 5-325 MG tablet Take 1 tablet by mouth every 6 (six) hours as needed for severe pain., Starting Sat 09/11/2017, Print         Janit Bern [place], [place] 09/12/17 2146

## 2017-09-16 DIAGNOSIS — M129 Arthropathy, unspecified: Secondary | ICD-10-CM | POA: Diagnosis not present

## 2017-09-16 DIAGNOSIS — G4733 Obstructive sleep apnea (adult) (pediatric): Secondary | ICD-10-CM | POA: Diagnosis not present

## 2017-09-17 ENCOUNTER — Other Ambulatory Visit: Payer: Self-pay | Admitting: Orthopedic Surgery

## 2017-09-21 ENCOUNTER — Encounter (HOSPITAL_COMMUNITY)
Admission: RE | Admit: 2017-09-21 | Discharge: 2017-09-21 | Disposition: A | Discharge status: Home / Self Care | Payer: PPO | Source: Ambulatory Visit | Attending: Orthopedic Surgery | Admitting: Orthopedic Surgery

## 2017-09-21 ENCOUNTER — Encounter (HOSPITAL_COMMUNITY): Payer: Self-pay

## 2017-09-21 ENCOUNTER — Other Ambulatory Visit: Payer: Self-pay

## 2017-09-21 ENCOUNTER — Ambulatory Visit (HOSPITAL_COMMUNITY)
Admission: RE | Admit: 2017-09-21 | Discharge: 2017-09-21 | Disposition: A | Discharge status: Home / Self Care | Payer: PPO | Source: Ambulatory Visit | Attending: Orthopedic Surgery | Admitting: Orthopedic Surgery

## 2017-09-21 DIAGNOSIS — Z01818 Encounter for other preprocedural examination: Secondary | ICD-10-CM

## 2017-09-21 DIAGNOSIS — M4856XA Collapsed vertebra, not elsewhere classified, lumbar region, initial encounter for fracture: Secondary | ICD-10-CM | POA: Diagnosis not present

## 2017-09-21 DIAGNOSIS — I700 Atherosclerosis of aorta: Secondary | ICD-10-CM | POA: Insufficient documentation

## 2017-09-21 DIAGNOSIS — Z01812 Encounter for preprocedural laboratory examination: Secondary | ICD-10-CM | POA: Insufficient documentation

## 2017-09-21 DIAGNOSIS — Z01810 Encounter for preprocedural cardiovascular examination: Secondary | ICD-10-CM | POA: Insufficient documentation

## 2017-09-21 DIAGNOSIS — J984 Other disorders of lung: Secondary | ICD-10-CM | POA: Diagnosis not present

## 2017-09-21 HISTORY — DX: Gastro-esophageal reflux disease without esophagitis: K21.9

## 2017-09-21 HISTORY — DX: Malignant (primary) neoplasm, unspecified: C80.1

## 2017-09-21 LAB — COMPREHENSIVE METABOLIC PANEL
ALT: 25 U/L (ref 17–63)
AST: 15 U/L (ref 15–41)
Albumin: 3.9 g/dL (ref 3.5–5.0)
Alkaline Phosphatase: 86 U/L (ref 38–126)
Anion gap: 4 — ABNORMAL LOW (ref 5–15)
BILIRUBIN TOTAL: 0.6 mg/dL (ref 0.3–1.2)
BUN: 19 mg/dL (ref 6–20)
CO2: 27 mmol/L (ref 22–32)
Calcium: 9.1 mg/dL (ref 8.9–10.3)
Chloride: 106 mmol/L (ref 101–111)
Creatinine, Ser: 0.79 mg/dL (ref 0.61–1.24)
Glucose, Bld: 109 mg/dL — ABNORMAL HIGH (ref 65–99)
POTASSIUM: 3.8 mmol/L (ref 3.5–5.1)
SODIUM: 137 mmol/L (ref 135–145)
TOTAL PROTEIN: 6.9 g/dL (ref 6.5–8.1)

## 2017-09-21 LAB — URINALYSIS, ROUTINE W REFLEX MICROSCOPIC
Glucose, UA: NEGATIVE mg/dL
Hgb urine dipstick: NEGATIVE
KETONES UR: NEGATIVE mg/dL
Leukocytes, UA: NEGATIVE
NITRITE: NEGATIVE
PH: 5 (ref 5.0–8.0)
PROTEIN: 30 mg/dL — AB
SQUAMOUS EPITHELIAL / LPF: NONE SEEN
Specific Gravity, Urine: 1.038 — ABNORMAL HIGH (ref 1.005–1.030)

## 2017-09-21 LAB — CBC WITH DIFFERENTIAL/PLATELET
BASOS PCT: 0 %
Basophils Absolute: 0 10*3/uL (ref 0.0–0.1)
EOS ABS: 0.3 10*3/uL (ref 0.0–0.7)
EOS PCT: 5 %
HCT: 38.7 % — ABNORMAL LOW (ref 39.0–52.0)
Hemoglobin: 12.7 g/dL — ABNORMAL LOW (ref 13.0–17.0)
LYMPHS ABS: 2 10*3/uL (ref 0.7–4.0)
Lymphocytes Relative: 32 %
MCH: 29.6 pg (ref 26.0–34.0)
MCHC: 32.8 g/dL (ref 30.0–36.0)
MCV: 90.2 fL (ref 78.0–100.0)
MONOS PCT: 10 %
Monocytes Absolute: 0.6 10*3/uL (ref 0.1–1.0)
Neutro Abs: 3.4 10*3/uL (ref 1.7–7.7)
Neutrophils Relative %: 53 %
PLATELETS: 295 10*3/uL (ref 150–400)
RBC: 4.29 MIL/uL (ref 4.22–5.81)
RDW: 13 % (ref 11.5–15.5)
WBC: 6.3 10*3/uL (ref 4.0–10.5)

## 2017-09-21 LAB — PROTIME-INR
INR: 1.02
PROTHROMBIN TIME: 13.3 s (ref 11.4–15.2)

## 2017-09-21 LAB — APTT: aPTT: 34 seconds (ref 24–36)

## 2017-09-21 MED ORDER — VANCOMYCIN HCL 10 G IV SOLR
1500.0000 mg | INTRAVENOUS | Status: AC
  Administered 2017-09-22: 1500 mg via INTRAVENOUS
  Filled 2017-09-21: qty 1500

## 2017-09-21 NOTE — Pre-Procedure Instructions (Addendum)
Brad Miller  09/21/2017      [facility] [address] [place] [address] [place] [place] [address] Phone: [phone] Fax: [phone]    Your procedure is scheduled on 09/22/17  Report to [facility] at 1245 A.M.  Call this number if you have problems the morning of surgery:  [phone]   Remember:  Do not eat food or drink liquids after midnight.  Take these medicines the morning of surgery with A SIP OF WATER        Xanax if needed,fioricet if needed,eye drops if needed,sertraline(zoloft),silodosin(apaflo  STOP all herbel meds, nsaids (aleve,naproxen,advil,ibuprofen)  NOW including all vitamins/supplements,aspirin,fish oil,melatonin,meloxicam,tumeric   Do not wear jewelry, make-up or nail polish.  Do not wear lotions, powders, or perfumes, or deoderant.  Do not shave 48 hours prior to surgery.  Men may shave face and neck.  Do not bring valuables to the hospital.  [facility] is not responsible for any belongings or valuables.  Contacts, dentures or bridgework may not be worn into surgery.  Leave your suitcase in the car.  After surgery it may be brought to your room.  For patients admitted to the hospital, discharge time will be determined by your treatment team.  Patients discharged the day of surgery will not be allowed to drive home.   Special instructions:   Special Instructions: [facility] - Preparing for Surgery  Before surgery, you can play an important role.  Because skin is not sterile, your skin needs to be as free of germs as possible.  You can reduce the number of germs on you skin by washing with CHG (chlorahexidine gluconate) soap before surgery.  CHG is an antiseptic cleaner which kills germs and bonds with the skin to continue killing germs even after washing.  Please DO NOT use if you have an allergy to CHG or antibacterial soaps.  If your skin becomes reddened/irritated stop using the  CHG and inform your nurse when you arrive at Short Stay.  Do not shave (including legs and underarms) for at least 48 hours prior to the first CHG shower.  You may shave your face.  Please follow these instructions carefully:   1.  Shower with CHG Soap the night before surgery and the morning of Surgery.  2.  If you choose to wash your hair, wash your hair first as usual with your normal shampoo.  3.  After you shampoo, rinse your hair and body thoroughly to remove the Shampoo.  4.  Use CHG as you would any other liquid soap.  You can apply chg directly  to the skin and wash gently with scrungie or a clean washcloth.  5.  Apply the CHG Soap to your body ONLY FROM THE NECK DOWN.  Do not use on open wounds or open sores.  Avoid contact with your eyes ears, mouth and genitals (private parts).  Wash genitals (private parts)       with your normal soap.  6.  Wash thoroughly, paying special attention to the area where your surgery will be performed.  7.  Thoroughly rinse your body with warm water from the neck down.  8.  DO NOT shower/wash with your normal soap after using and rinsing off the CHG Soap.  9.  Pat yourself dry with a clean towel.            10.  Wear clean pajamas.  11.  Place clean sheets on your bed the night of your first shower and do not sleep with pets.  Day of Surgery  Do not apply any lotions/deodorants the morning of surgery.  Please wear clean clothes to the hospital/surgery center.  Please read over the  fact sheets that you were given.

## 2017-09-22 ENCOUNTER — Encounter (HOSPITAL_COMMUNITY)
Admission: RE | Disposition: A | Discharge status: Home / Self Care | Payer: Self-pay | Source: Ambulatory Visit | Attending: Orthopedic Surgery

## 2017-09-22 ENCOUNTER — Ambulatory Visit (HOSPITAL_COMMUNITY): Payer: PPO

## 2017-09-22 ENCOUNTER — Ambulatory Visit (HOSPITAL_COMMUNITY)
Admission: RE | Admit: 2017-09-22 | Discharge: 2017-09-22 | Disposition: A | Discharge status: Home / Self Care | Payer: PPO | Source: Ambulatory Visit | Attending: Orthopedic Surgery | Admitting: Orthopedic Surgery

## 2017-09-22 ENCOUNTER — Ambulatory Visit (HOSPITAL_COMMUNITY): Payer: PPO | Admitting: Anesthesiology

## 2017-09-22 ENCOUNTER — Encounter (HOSPITAL_COMMUNITY): Payer: Self-pay

## 2017-09-22 DIAGNOSIS — Z6841 Body Mass Index (BMI) 40.0 and over, adult: Secondary | ICD-10-CM | POA: Diagnosis not present

## 2017-09-22 DIAGNOSIS — Z881 Allergy status to other antibiotic agents status: Secondary | ICD-10-CM | POA: Diagnosis not present

## 2017-09-22 DIAGNOSIS — W010XXA Fall on same level from slipping, tripping and stumbling without subsequent striking against object, initial encounter: Secondary | ICD-10-CM | POA: Diagnosis not present

## 2017-09-22 DIAGNOSIS — Z85048 Personal history of other malignant neoplasm of rectum, rectosigmoid junction, and anus: Secondary | ICD-10-CM | POA: Diagnosis not present

## 2017-09-22 DIAGNOSIS — F419 Anxiety disorder, unspecified: Secondary | ICD-10-CM | POA: Diagnosis not present

## 2017-09-22 DIAGNOSIS — Z96651 Presence of right artificial knee joint: Secondary | ICD-10-CM | POA: Insufficient documentation

## 2017-09-22 DIAGNOSIS — E785 Hyperlipidemia, unspecified: Secondary | ICD-10-CM | POA: Insufficient documentation

## 2017-09-22 DIAGNOSIS — S32010A Wedge compression fracture of first lumbar vertebra, initial encounter for closed fracture: Secondary | ICD-10-CM | POA: Diagnosis not present

## 2017-09-22 DIAGNOSIS — I10 Essential (primary) hypertension: Secondary | ICD-10-CM | POA: Diagnosis not present

## 2017-09-22 DIAGNOSIS — Z888 Allergy status to other drugs, medicaments and biological substances status: Secondary | ICD-10-CM | POA: Diagnosis not present

## 2017-09-22 DIAGNOSIS — F329 Major depressive disorder, single episode, unspecified: Secondary | ICD-10-CM | POA: Diagnosis not present

## 2017-09-22 DIAGNOSIS — Z419 Encounter for procedure for purposes other than remedying health state, unspecified: Secondary | ICD-10-CM

## 2017-09-22 DIAGNOSIS — Z880 Allergy status to penicillin: Secondary | ICD-10-CM | POA: Diagnosis not present

## 2017-09-22 DIAGNOSIS — N400 Enlarged prostate without lower urinary tract symptoms: Secondary | ICD-10-CM | POA: Diagnosis not present

## 2017-09-22 DIAGNOSIS — M1990 Unspecified osteoarthritis, unspecified site: Secondary | ICD-10-CM | POA: Insufficient documentation

## 2017-09-22 DIAGNOSIS — Z9989 Dependence on other enabling machines and devices: Secondary | ICD-10-CM | POA: Diagnosis not present

## 2017-09-22 DIAGNOSIS — G4730 Sleep apnea, unspecified: Secondary | ICD-10-CM | POA: Insufficient documentation

## 2017-09-22 DIAGNOSIS — M4856XA Collapsed vertebra, not elsewhere classified, lumbar region, initial encounter for fracture: Secondary | ICD-10-CM | POA: Insufficient documentation

## 2017-09-22 DIAGNOSIS — M1711 Unilateral primary osteoarthritis, right knee: Secondary | ICD-10-CM | POA: Diagnosis not present

## 2017-09-22 DIAGNOSIS — F410 Panic disorder [episodic paroxysmal anxiety] without agoraphobia: Secondary | ICD-10-CM | POA: Diagnosis not present

## 2017-09-22 DIAGNOSIS — K219 Gastro-esophageal reflux disease without esophagitis: Secondary | ICD-10-CM | POA: Diagnosis not present

## 2017-09-22 DIAGNOSIS — Z885 Allergy status to narcotic agent status: Secondary | ICD-10-CM | POA: Insufficient documentation

## 2017-09-22 DIAGNOSIS — Z981 Arthrodesis status: Secondary | ICD-10-CM | POA: Diagnosis not present

## 2017-09-22 DIAGNOSIS — Z79899 Other long term (current) drug therapy: Secondary | ICD-10-CM | POA: Diagnosis not present

## 2017-09-22 HISTORY — PX: KYPHOPLASTY: SHX5884

## 2017-09-22 SURGERY — KYPHOPLASTY
Anesthesia: General

## 2017-09-22 MED ORDER — PHENYLEPHRINE HCL 10 MG/ML IJ SOLN
INTRAMUSCULAR | Status: DC | PRN
  Administered 2017-09-22 (×2): 80 ug via INTRAVENOUS

## 2017-09-22 MED ORDER — FENTANYL CITRATE (PF) 100 MCG/2ML IJ SOLN
INTRAMUSCULAR | Status: AC
  Administered 2017-09-22: 50 ug via INTRAVENOUS
  Filled 2017-09-22: qty 2

## 2017-09-22 MED ORDER — ONDANSETRON HCL 4 MG/2ML IJ SOLN: 4.0000 mg | Freq: Once | INTRAMUSCULAR | Status: DC | PRN

## 2017-09-22 MED ORDER — DEXAMETHASONE SODIUM PHOSPHATE 10 MG/ML IJ SOLN
INTRAMUSCULAR | Status: DC | PRN
  Administered 2017-09-22: 10 mg via INTRAVENOUS

## 2017-09-22 MED ORDER — SUCCINYLCHOLINE CHLORIDE 200 MG/10ML IV SOSY
PREFILLED_SYRINGE | INTRAVENOUS | Status: AC
  Filled 2017-09-22: qty 10

## 2017-09-22 MED ORDER — IOPAMIDOL (ISOVUE-300) INJECTION 61%
INTRAVENOUS | Status: DC | PRN
  Administered 2017-09-22: 50 mL via INTRAVENOUS

## 2017-09-22 MED ORDER — DEXAMETHASONE SODIUM PHOSPHATE 10 MG/ML IJ SOLN
INTRAMUSCULAR | Status: AC
  Filled 2017-09-22: qty 1

## 2017-09-22 MED ORDER — BACITRACIN ZINC 500 UNIT/GM EX OINT
TOPICAL_OINTMENT | CUTANEOUS | Status: AC
  Filled 2017-09-22: qty 28.35

## 2017-09-22 MED ORDER — POVIDONE-IODINE 7.5 % EX SOLN: Freq: Once | CUTANEOUS | Status: DC

## 2017-09-22 MED ORDER — SUCCINYLCHOLINE CHLORIDE 20 MG/ML IJ SOLN
INTRAMUSCULAR | Status: DC | PRN
  Administered 2017-09-22: 200 mg via INTRAVENOUS

## 2017-09-22 MED ORDER — PROPOFOL 10 MG/ML IV BOLUS
INTRAVENOUS | Status: AC
  Filled 2017-09-22: qty 40

## 2017-09-22 MED ORDER — FENTANYL CITRATE (PF) 100 MCG/2ML IJ SOLN
25.0000 ug | INTRAMUSCULAR | Status: DC | PRN
  Administered 2017-09-22 (×2): 50 ug via INTRAVENOUS

## 2017-09-22 MED ORDER — MEPERIDINE HCL 25 MG/ML IJ SOLN: 6.2500 mg | INTRAMUSCULAR | Status: DC | PRN

## 2017-09-22 MED ORDER — BUPIVACAINE-EPINEPHRINE (PF) 0.25% -1:200000 IJ SOLN
INTRAMUSCULAR | Status: AC
  Filled 2017-09-22: qty 30

## 2017-09-22 MED ORDER — LIDOCAINE HCL (CARDIAC) 20 MG/ML IV SOLN
INTRAVENOUS | Status: DC | PRN
  Administered 2017-09-22: 100 mg via INTRAVENOUS

## 2017-09-22 MED ORDER — LACTATED RINGERS IV SOLN
INTRAVENOUS | Status: DC
Start: 2017-09-22 — End: 2017-09-22
  Administered 2017-09-22: 13:00:00 via INTRAVENOUS

## 2017-09-22 MED ORDER — IOPAMIDOL (ISOVUE-300) INJECTION 61%
INTRAVENOUS | Status: AC
  Filled 2017-09-22: qty 50

## 2017-09-22 MED ORDER — ONDANSETRON HCL 4 MG/2ML IJ SOLN
INTRAMUSCULAR | Status: AC
  Filled 2017-09-22: qty 2

## 2017-09-22 MED ORDER — SUGAMMADEX SODIUM 500 MG/5ML IV SOLN
INTRAVENOUS | Status: DC | PRN
  Administered 2017-09-22: 500 mg via INTRAVENOUS

## 2017-09-22 MED ORDER — LIDOCAINE 2% (20 MG/ML) 5 ML SYRINGE
INTRAMUSCULAR | Status: AC
  Filled 2017-09-22: qty 5

## 2017-09-22 MED ORDER — SUGAMMADEX SODIUM 500 MG/5ML IV SOLN
INTRAVENOUS | Status: AC
  Filled 2017-09-22: qty 5

## 2017-09-22 MED ORDER — ONDANSETRON HCL 4 MG/2ML IJ SOLN
INTRAMUSCULAR | Status: DC | PRN
  Administered 2017-09-22: 4 mg via INTRAVENOUS

## 2017-09-22 MED ORDER — MIDAZOLAM HCL 2 MG/2ML IJ SOLN
INTRAMUSCULAR | Status: AC
  Filled 2017-09-22: qty 2

## 2017-09-22 MED ORDER — MIDAZOLAM HCL 5 MG/5ML IJ SOLN
INTRAMUSCULAR | Status: DC | PRN
  Administered 2017-09-22: 2 mg via INTRAVENOUS

## 2017-09-22 MED ORDER — BACITRACIN ZINC 500 UNIT/GM EX OINT
TOPICAL_OINTMENT | CUTANEOUS | Status: DC | PRN
  Administered 2017-09-22: 1 via TOPICAL

## 2017-09-22 MED ORDER — FENTANYL CITRATE (PF) 100 MCG/2ML IJ SOLN
INTRAMUSCULAR | Status: DC | PRN
  Administered 2017-09-22: 100 ug via INTRAVENOUS

## 2017-09-22 MED ORDER — OXYCODONE-ACETAMINOPHEN 5-325 MG PO TABS
ORAL_TABLET | ORAL | Status: AC
  Filled 2017-09-22: qty 1

## 2017-09-22 MED ORDER — ROCURONIUM BROMIDE 100 MG/10ML IV SOLN
INTRAVENOUS | Status: DC | PRN
  Administered 2017-09-22: 50 mg via INTRAVENOUS

## 2017-09-22 MED ORDER — ROCURONIUM BROMIDE 10 MG/ML (PF) SYRINGE
PREFILLED_SYRINGE | INTRAVENOUS | Status: AC
  Filled 2017-09-22: qty 5

## 2017-09-22 MED ORDER — FENTANYL CITRATE (PF) 250 MCG/5ML IJ SOLN
INTRAMUSCULAR | Status: AC
  Filled 2017-09-22: qty 5

## 2017-09-22 MED ORDER — PROPOFOL 10 MG/ML IV BOLUS
INTRAVENOUS | Status: DC | PRN
  Administered 2017-09-22: 100 mg via INTRAVENOUS

## 2017-09-22 MED ORDER — PHENYLEPHRINE 40 MCG/ML (10ML) SYRINGE FOR IV PUSH (FOR BLOOD PRESSURE SUPPORT)
PREFILLED_SYRINGE | INTRAVENOUS | Status: AC
  Filled 2017-09-22: qty 10

## 2017-09-22 MED ORDER — OXYCODONE-ACETAMINOPHEN 5-325 MG PO TABS
1.0000 | ORAL_TABLET | Freq: Once | ORAL | Status: AC
  Administered 2017-09-22: 1 via ORAL

## 2017-09-22 SURGICAL SUPPLY — 41 items
BLADE SURG 15 STRL LF DISP TIS (BLADE) ×1 IMPLANT
BLADE SURG 15 STRL SS (BLADE) ×2
CEMENT BONE KYPHX HV R (Orthopedic Implant) ×3 IMPLANT
COVER MAYO STAND STRL (DRAPES) ×3 IMPLANT
COVER SURGICAL LIGHT HANDLE (MISCELLANEOUS) ×3 IMPLANT
CURETTE EXPRESS SZ2 7MM (INSTRUMENTS) IMPLANT
CURRETTE EXPRESS SZ2 7MM (INSTRUMENTS)
DRAPE C-ARM 42X72 X-RAY (DRAPES) ×3 IMPLANT
DRAPE HALF SHEET 40X57 (DRAPES) IMPLANT
DRAPE INCISE IOBAN 66X45 STRL (DRAPES) ×3 IMPLANT
DRAPE LAPAROTOMY T 102X78X121 (DRAPES) ×3 IMPLANT
DRAPE SURG 17X23 STRL (DRAPES) ×12 IMPLANT
DURAPREP 26ML APPLICATOR (WOUND CARE) ×3 IMPLANT
GAUZE SPONGE 4X4 12PLY STRL (GAUZE/BANDAGES/DRESSINGS) ×3 IMPLANT
GAUZE SPONGE 4X4 16PLY XRAY LF (GAUZE/BANDAGES/DRESSINGS) ×3 IMPLANT
GLOVE BIO SURGEON STRL SZ7 (GLOVE) ×3 IMPLANT
GLOVE BIO SURGEON STRL SZ8 (GLOVE) ×3 IMPLANT
GLOVE BIOGEL PI IND STRL 7.0 (GLOVE) ×1 IMPLANT
GLOVE BIOGEL PI IND STRL 8 (GLOVE) ×1 IMPLANT
GLOVE BIOGEL PI INDICATOR 7.0 (GLOVE) ×2
GLOVE BIOGEL PI INDICATOR 8 (GLOVE) ×2
GOWN STRL REUS W/ TWL LRG LVL3 (GOWN DISPOSABLE) ×2 IMPLANT
GOWN STRL REUS W/ TWL XL LVL3 (GOWN DISPOSABLE) ×1 IMPLANT
GOWN STRL REUS W/TWL LRG LVL3 (GOWN DISPOSABLE) ×4
GOWN STRL REUS W/TWL XL LVL3 (GOWN DISPOSABLE) ×2
KIT BASIN OR (CUSTOM PROCEDURE TRAY) ×3 IMPLANT
KIT ROOM TURNOVER OR (KITS) ×3 IMPLANT
NEEDLE 22X1 1/2 (OR ONLY) (NEEDLE) IMPLANT
NEEDLE HYPO 25X1 1.5 SAFETY (NEEDLE) IMPLANT
NEEDLE SPNL 18GX3.5 QUINCKE PK (NEEDLE) ×6 IMPLANT
NS IRRIG 1000ML POUR BTL (IV SOLUTION) ×3 IMPLANT
PACK UNIVERSAL I (CUSTOM PROCEDURE TRAY) ×3 IMPLANT
PAD ARMBOARD 7.5X6 YLW CONV (MISCELLANEOUS) ×6 IMPLANT
POSITIONER HEAD PRONE TRACH (MISCELLANEOUS) ×3 IMPLANT
SUT MNCRL AB 4-0 PS2 18 (SUTURE) ×3 IMPLANT
SYR BULB IRRIGATION 50ML (SYRINGE) ×3 IMPLANT
SYR CONTROL 10ML LL (SYRINGE) ×3 IMPLANT
TOWEL OR 17X24 6PK STRL BLUE (TOWEL DISPOSABLE) ×3 IMPLANT
TOWEL OR 17X26 10 PK STRL BLUE (TOWEL DISPOSABLE) ×3 IMPLANT
TRAY KYPHOPAK 15/3 ONESTEP 1ST (MISCELLANEOUS) IMPLANT
TRAY KYPHOPAK 20/3 ONESTEP 1ST (MISCELLANEOUS) ×3 IMPLANT

## 2017-09-22 NOTE — Transfer of Care (Signed)
Immediate Anesthesia Transfer of Care Note  Patient: Brad Miller  Procedure(s) Performed: LUMBAR 1 KYPHOPLASTY (N/A )  Patient Location: PACU  Anesthesia Type:General  Level of Consciousness: awake  Airway & Oxygen Therapy: Patient Spontanous Breathing and Patient connected to face mask oxygen  Post-op Assessment: Report given to RN and Post -op Vital signs reviewed and stable  Post vital signs: Reviewed and stable  Last Vitals:  Vitals:   09/22/17 1259 09/22/17 1726  BP: (!) 130/58 119/64  Pulse: 75 99  Resp: 18 (!) 6  Temp: (!) 36.4 C 36.6 C  SpO2: 95% 91%    Last Pain:  Vitals:   09/22/17 1726  TempSrc:   PainSc: Asleep         Complications: No apparent anesthesia complications

## 2017-09-22 NOTE — Anesthesia Preprocedure Evaluation (Addendum)
Anesthesia Evaluation  Patient identified by MRN, date of birth, ID band Patient awake    Reviewed: Allergy & Precautions, NPO status , Patient's Chart, lab work & pertinent test results  Airway Mallampati: I  TM Distance: >3 FB Neck ROM: Full    Dental no notable dental hx. (+) Teeth Intact, Dental Advisory Given   Pulmonary sleep apnea and Continuous Positive Airway Pressure Ventilation ,    Pulmonary exam normal breath sounds clear to auscultation       Cardiovascular hypertension, Pt. on medications Normal cardiovascular exam Rhythm:Regular Rate:Normal     Neuro/Psych PSYCHIATRIC DISORDERS Anxiety Depression    GI/Hepatic   Endo/Other  Morbid obesity  Renal/GU      Musculoskeletal  (+) Arthritis , Osteoarthritis,    Abdominal   Peds  Hematology   Anesthesia Other Findings   Reproductive/Obstetrics                           Anesthesia Physical  Anesthesia Plan  ASA: II  Anesthesia Plan: General   Post-op Pain Management:    Induction: Intravenous  PONV Risk Score and Plan:   Airway Management Planned: Oral ETT  Additional Equipment:   Intra-op Plan:   Post-operative Plan: Extubation in OR  Informed Consent: I have reviewed the patients History and Physical, chart, labs and discussed the procedure including the risks, benefits and alternatives for the proposed anesthesia with the patient or authorized representative who has indicated his/her understanding and acceptance.     Plan Discussed with: CRNA and Surgeon  Anesthesia Plan Comments:         Anesthesia Quick Evaluation

## 2017-09-22 NOTE — H&P (Signed)
PREOPERATIVE H&P  Chief Complaint: Low back pain  HPI: Brad Miller is a 66 y.o. male who presents with ongoing pain in the low back  CT and x-rays reveal a compression fracture at L1  Patient has failed multiple forms of conservative care and continues to have pain (see office notes for additional details regarding the patient's full course of treatment)  Past Medical History:  Diagnosis Date  . Anxiety    TAKES XANAX DAILY AS NEEDED  . Arthritis   . Cancer ([place]) 2017   rectal  . Cataracts, bilateral   . Depression    TAKES WELLBUTRIN DAILY  . Enlarged prostate   . GERD (gastroesophageal reflux disease)    occ  . Headache(784.0)    OCCASIONALLY  . Hyperlipidemia    TAKES LOVASTATIN NIGHTLY  . Hypertension    TAKES CARDURA DAILY  . Joint pain   . MDD (major depressive disorder), recurrent severe, without psychosis ([place]) 11/02/2015  . Panic attacks 11/02/2015  . Sleep apnea    USES CPAP, PT DOES NOT KNOW SETTINGS  . Urinary frequency   . Urinary urgency    Past Surgical History:  Procedure Laterality Date  . BENIGN TUMOR REMOVED FROM CHEST    . CATARACT EXTRACTION Bilateral   . EUS N/A 05/21/2016   Procedure: LOWER ENDOSCOPIC ULTRASOUND (EUS);  Surgeon: Arta Silence, MD;  Location: [facility] ENDOSCOPY;  Service: Endoscopy;  Laterality: N/A;  . EYE SURGERY    . FLEXIBLE SIGMOIDOSCOPY N/A 05/21/2016   Procedure: FLEXIBLE SIGMOIDOSCOPY;  Surgeon: Arta Silence, MD;  Location[facility] ENDOSCOPY;  Service: Endoscopy;  Laterality: N/A;  . KNEE ARTHROSCOPY Left 02/23/2014   Procedure: LEFT ARTHROSCOPY KNEE;  Surgeon: Kerin Salen, MD;  Location: [facility];  Service: Orthopedics;  Laterality: Left;  . PARTIAL PROCTECTOMY BY TEM N/A 07/30/2016   Procedure: PARTIAL PROCTECTOMY BY TEM WITH EXAM UNDER ANESTHESIA;  Surgeon: Michael Boston, MD;  Location: [facility] ORS;  Service: General;  Laterality: N/A;  . TOTAL KNEE ARTHROPLASTY Right 02/23/2014   Procedure: TOTAL KNEE ARTHROPLASTY;  Surgeon:  Kerin Salen, MD;  Location: [facility];  Service: Orthopedics;  Laterality: Right;   Social History   Social History  . Marital status: Married    Spouse name: N/A  . Number of children: 2  . Years of education: N/A   Occupational History  . Not Working    Social History Main Topics  . Smoking status: Never Smoker  . Smokeless tobacco: Never Used  . Alcohol use 0.6 oz/week    1 Cans of beer per week     Comment: Rare  . Drug use: No  . Sexual activity: Not Currently   Other Topics Concern  . Not on file   Social History Narrative  . No narrative on file   Family History  Problem Relation Age of Onset  . Hypertension Mother   . Heart attack Mother   . Osteoporosis Mother   . CVA Mother   . Coronary artery disease Mother   . Hypertension Father   . Emphysema Father   . Dementia Father        father tried to commit suicide when he had dementia  . Coronary artery disease Brother   . Diabetes Brother    Allergies  Allergen Reactions  . Ciprofloxacin Nausea And Vomiting and Other (See Comments)    GI side effects   . Penicillins Other (See Comments)    Childhood Reactrion > Knot on  arm requiring MD intervention.  Has patient had a PCN reaction causing immediate rash, facial/tongue/throat swelling, SOB or lightheadedness with hypotension: no Has patient had a PCN reaction causing severe rash involving mucus membranes or skin necrosis: no Has patient had a PCN reaction that required hospitalization: NO, but did require MD intervention. PCN reaction occurring within the last 10 years?  #  #  #  YES  #  #  #   . Zocor [Simvastatin] Other (See Comments)    Back aches   . Hydrocodone Itching    consitpation   Prior to Admission medications   Medication Sig Start Date End Date Taking? Authorizing Provider  ALPRAZolam Duanne Moron) 0.5 MG tablet Take 0.5 mg by mouth 2 (two) times daily as needed. For anxiety. 09/03/17  Yes [provider]  buPROPion (WELLBUTRIN XL) 300  MG 24 hr tablet Take 1 tablet (300 mg total) by mouth daily. 12/30/16  Yes Plovsky, Berneta Sages, MD  butalbital-acetaminophen-caffeine (FIORICET, ESGIC) [phone] MG tablet Take 1-2 tablets by mouth 2 (two) times daily as needed for headache.   Yes [provider]  cycloSPORINE (RESTASIS) 0.05 % ophthalmic emulsion Place 1 drop into both eyes 2 (two) times daily.    Yes [provider]  doxazosin (CARDURA) 8 MG tablet Take 0.5 tablets (4 mg total) by mouth daily. Patient taking differently: Take 4 mg by mouth at bedtime.  11/06/15  Yes Withrow, Elyse Jarvis, FNP  meloxicam (MOBIC) 15 MG tablet Take 15 mg by mouth daily.   Yes [provider]  Multiple Vitamins-Minerals (MULTIVITAMIN WITH MINERALS) tablet Take 1 tablet by mouth daily.   Yes [provider]  oxyCODONE-acetaminophen (PERCOCET/ROXICET) 5-325 MG tablet Take 1 tablet by mouth every 6 (six) hours as needed for severe pain. 09/11/17  Yes Neese, Hope M, NP  polyethylene glycol powder (GLYCOLAX/MIRALAX) powder Take 17 g by mouth at bedtime.   Yes [provider]  Potassium 99 MG TABS Take 99 mg by mouth at bedtime.   Yes [provider]  pyridOXINE (VITAMIN B-6) 100 MG tablet Take 100 mg by mouth daily with breakfast.   Yes [provider]  sertraline (ZOLOFT) 50 MG tablet Take 50 mg by mouth daily. 08/11/17  Yes [provider]  silodosin (RAPAFLO) 8 MG CAPS capsule Take 8 mg by mouth daily with breakfast.   Yes [provider]  traMADol (ULTRAM) 50 MG tablet Take 1-2 tablets (50-100 mg total) by mouth every 6 (six) hours as needed for moderate pain or severe pain. 07/30/16  Yes Michael Boston, MD  traZODone (DESYREL) 100 MG tablet 1 qhs  May use 2  Qhs as needed Patient taking differently: Take 100 mg by mouth at bedtime.  01/19/17  Yes Plovsky, Berneta Sages, MD  TURMERIC PO Take 1 tablet by mouth daily.    Yes [provider]  vitamin B-12 (CYANOCOBALAMIN) 1000 MCG tablet  Take 1,000 mcg by mouth daily.   Yes [provider]  diclofenac (VOLTAREN) 50 MG EC tablet Take 1 tablet (50 mg total) by mouth 2 (two) times daily. Patient not taking: Reported on 09/20/2017 09/11/17   Ashley Murrain, NP  ibuprofen (ADVIL,MOTRIN) 800 MG tablet Take 800 mg by mouth every 8 (eight) hours as needed (for pain).    [provider]  Melatonin 10 MG TABS Take 10 mg by mouth at bedtime.    [provider]  risperiDONE (RISPERDAL) 0.25 MG tablet Take 1 tablet (0.25 mg total) by mouth at bedtime.  2  qhs Patient not taking: Reported on 09/20/2017 09/30/16   Norma Fredrickson, MD     All other systems have been reviewed and were otherwise negative with the exception of those mentioned in the HPI and as above.  Physical Exam: There were no vitals filed for this visit.  General: Alert, no acute distress Cardiovascular: No pedal edema Respiratory: No cyanosis, no use of accessory musculature Skin: No lesions in the area of chief complaint Neurologic: Sensation intact distally Psychiatric: Patient is competent for consent with normal mood and affect Lymphatic: No axillary or cervical lymphadenopathy  MUSCULOSKELETAL: + TTP low back  Assessment/Plan: Lumbar 1 compression fracture Plan for Procedure(s): LUMBAR 1 KYPHOPLASTY   Sinclair Ship, MD 09/22/2017 7:31 AM

## 2017-09-22 NOTE — Anesthesia Procedure Notes (Signed)
Procedure Name: Intubation Date/Time: 09/22/2017 4:20 PM Performed by: Jenne Campus Pre-anesthesia Checklist: Patient identified, Emergency Drugs available, Suction available and Patient being monitored Patient Re-evaluated:Patient Re-evaluated prior to induction Oxygen Delivery Method: Circle System Utilized Preoxygenation: Pre-oxygenation with 100% oxygen Induction Type: IV induction Ventilation: Mask ventilation without difficulty Laryngoscope Size: Mac and 4 Grade View: Grade I Tube type: Oral Tube size: 7.5 mm Number of attempts: 1 Airway Equipment and Method: Stylet and Oral airway Placement Confirmation: ETT inserted through vocal cords under direct vision,  positive ETCO2 and breath sounds checked- equal and bilateral Secured at: 22 cm Tube secured with: Tape Dental Injury: Teeth and Oropharynx as per pre-operative assessment

## 2017-09-23 ENCOUNTER — Encounter (HOSPITAL_COMMUNITY): Payer: Self-pay | Admitting: Orthopedic Surgery

## 2017-09-23 NOTE — Anesthesia Postprocedure Evaluation (Signed)
Anesthesia Post Note  Patient: DONNY HEFFERN  Procedure(s) Performed: LUMBAR 1 KYPHOPLASTY (N/A )     Patient location during evaluation: PACU Anesthesia Type: General Level of consciousness: awake and alert Pain management: pain level controlled Vital Signs Assessment: post-procedure vital signs reviewed and stable Respiratory status: spontaneous breathing, nonlabored ventilation, respiratory function stable and patient connected to nasal cannula oxygen Cardiovascular status: blood pressure returned to baseline and stable Postop Assessment: no apparent nausea or vomiting Anesthetic complications: no    Last Vitals:  Vitals:   09/22/17 1831 09/22/17 1846  BP: (!) 161/75 (!) 147/59  Pulse: 68 66  Resp: 12 13  Temp:    SpO2: 96% 95%    Last Pain:  Vitals:   09/22/17 1830  TempSrc:   PainSc: 5                  Makara Lanzo

## 2017-09-23 NOTE — Op Note (Signed)
Brad Miller, Brad Miller NO.:  [id number]  MEDICAL RECORD NO.:  [id number]  PHYSICIAN:  Phylliss Bob, MD           DATE OF BIRTH:  DATE OF PROCEDURE:  09/22/2017                              OPERATIVE REPORT   PREOPERATIVE DIAGNOSIS:  L1 compression fracture.  POSTOPERATIVE DIAGNOSIS:  L1 compression fracture.  PROCEDURE:  L1 kyphoplasty.  SURGEON:  Phylliss Bob, MD.  ASSISTANTPricilla Holm.  COMPLICATIONS:  None.  DISPOSITION:  Stable.  ESTIMATED BLOOD LOSS:  Minimal.  INDICATIONS FOR SURGERY:  Briefly, [name] is a pleasant 66 year old male who did present to me 4 days after a substantial injury where he tripped and fell onto his left side.  He immediately had rather debilitating pain in the low back.  A CAT scan and x-rays did reveal a compression deformity at the L1 level.  In the office, it was clear that the superior endplate of L1 had sustained interval collapse, as compared to the CT scan obtained 3 days prior.  Given the patient's rather debilitating and ongoing pain, we did discuss proceeding with the procedure noted above.  The patient was fully aware of the risks and limitations of surgery and did elect to proceed.  OPERATIVE DETAILS:  On September 22, 2017, the patient was brought to Surgery and general endotracheal anesthesia was administered.  The patient was placed prone on a well-padded flat Jackson bed.  Gel rolls were placed under the patient's chest and hips.  Antibiotics were given and a time-out procedure was performed.  Using AP and lateral fluoroscopy, I did cannulate the L1 pedicles bilaterally.  Through the cannulas, I did insert a drill and kyphoplasty balloons, with excellent restoration of the superior endplate of L1.  At this point, I did introduce a total of approximately 11 mL of cement, approximately half through each cannula.  Of note, excellent interdigitation of cement was noted.  Of note, there was no abnormal  extravasation of cement posteriorly into the spinal canal or into the retroperitoneal space anteriorly.  The cement was then allowed to harden.  The cannulas were then removed. The wounds were irrigated.  The wounds were then closed using 4-0 Monocryl.  Bacitracin and a sterile dressing were then applied.  All instrument counts were correct.  The patient was then awoken from general endotracheal anesthesia and transferred to recovery in stable condition.     Phylliss Bob, MD   ______________________________ Phylliss Bob, MD    MD/MEDQ  D:  09/22/2017  T:  09/23/2017  Job:  [id number]  cc:   Dr. Maurice Small

## 2017-10-04 DIAGNOSIS — M545 Low back pain: Secondary | ICD-10-CM | POA: Diagnosis not present

## 2017-10-05 DIAGNOSIS — S32010D Wedge compression fracture of first lumbar vertebra, subsequent encounter for fracture with routine healing: Secondary | ICD-10-CM | POA: Diagnosis not present

## 2017-10-05 DIAGNOSIS — H6982 Other specified disorders of Eustachian tube, left ear: Secondary | ICD-10-CM | POA: Diagnosis not present

## 2017-10-05 DIAGNOSIS — M545 Low back pain: Secondary | ICD-10-CM | POA: Diagnosis not present

## 2017-10-05 DIAGNOSIS — Z9889 Other specified postprocedural states: Secondary | ICD-10-CM | POA: Diagnosis not present

## 2017-10-07 DIAGNOSIS — Z9889 Other specified postprocedural states: Secondary | ICD-10-CM | POA: Diagnosis not present

## 2017-10-07 DIAGNOSIS — S32010D Wedge compression fracture of first lumbar vertebra, subsequent encounter for fracture with routine healing: Secondary | ICD-10-CM | POA: Diagnosis not present

## 2017-10-07 DIAGNOSIS — M545 Low back pain: Secondary | ICD-10-CM | POA: Diagnosis not present

## 2017-10-12 DIAGNOSIS — Z9889 Other specified postprocedural states: Secondary | ICD-10-CM | POA: Diagnosis not present

## 2017-10-12 DIAGNOSIS — S32010D Wedge compression fracture of first lumbar vertebra, subsequent encounter for fracture with routine healing: Secondary | ICD-10-CM | POA: Diagnosis not present

## 2017-10-12 DIAGNOSIS — M545 Low back pain: Secondary | ICD-10-CM | POA: Diagnosis not present

## 2017-10-14 DIAGNOSIS — S32010D Wedge compression fracture of first lumbar vertebra, subsequent encounter for fracture with routine healing: Secondary | ICD-10-CM | POA: Diagnosis not present

## 2017-10-14 DIAGNOSIS — Z9889 Other specified postprocedural states: Secondary | ICD-10-CM | POA: Diagnosis not present

## 2017-10-14 DIAGNOSIS — M545 Low back pain: Secondary | ICD-10-CM | POA: Diagnosis not present

## 2017-10-16 DIAGNOSIS — M129 Arthropathy, unspecified: Secondary | ICD-10-CM | POA: Diagnosis not present

## 2017-10-16 DIAGNOSIS — G4733 Obstructive sleep apnea (adult) (pediatric): Secondary | ICD-10-CM | POA: Diagnosis not present

## 2017-10-18 DIAGNOSIS — N419 Inflammatory disease of prostate, unspecified: Secondary | ICD-10-CM | POA: Diagnosis not present

## 2017-10-19 DIAGNOSIS — Z9889 Other specified postprocedural states: Secondary | ICD-10-CM | POA: Diagnosis not present

## 2017-10-19 DIAGNOSIS — S32010D Wedge compression fracture of first lumbar vertebra, subsequent encounter for fracture with routine healing: Secondary | ICD-10-CM | POA: Diagnosis not present

## 2017-10-19 DIAGNOSIS — M545 Low back pain: Secondary | ICD-10-CM | POA: Diagnosis not present

## 2017-10-22 DIAGNOSIS — Z9889 Other specified postprocedural states: Secondary | ICD-10-CM | POA: Diagnosis not present

## 2017-10-22 DIAGNOSIS — M545 Low back pain: Secondary | ICD-10-CM | POA: Diagnosis not present

## 2017-10-22 DIAGNOSIS — S32010D Wedge compression fracture of first lumbar vertebra, subsequent encounter for fracture with routine healing: Secondary | ICD-10-CM | POA: Diagnosis not present

## 2017-10-26 DIAGNOSIS — S32010D Wedge compression fracture of first lumbar vertebra, subsequent encounter for fracture with routine healing: Secondary | ICD-10-CM | POA: Diagnosis not present

## 2017-10-26 DIAGNOSIS — M545 Low back pain: Secondary | ICD-10-CM | POA: Diagnosis not present

## 2017-10-26 DIAGNOSIS — Z9889 Other specified postprocedural states: Secondary | ICD-10-CM | POA: Diagnosis not present

## 2017-10-28 DIAGNOSIS — Z9889 Other specified postprocedural states: Secondary | ICD-10-CM | POA: Diagnosis not present

## 2017-10-28 DIAGNOSIS — M545 Low back pain: Secondary | ICD-10-CM | POA: Diagnosis not present

## 2017-10-28 DIAGNOSIS — S32010D Wedge compression fracture of first lumbar vertebra, subsequent encounter for fracture with routine healing: Secondary | ICD-10-CM | POA: Diagnosis not present

## 2017-10-29 ENCOUNTER — Other Ambulatory Visit (HOSPITAL_COMMUNITY): Payer: Self-pay | Admitting: Psychiatry

## 2017-10-29 DIAGNOSIS — M25552 Pain in left hip: Secondary | ICD-10-CM | POA: Diagnosis not present

## 2017-11-02 DIAGNOSIS — S32010D Wedge compression fracture of first lumbar vertebra, subsequent encounter for fracture with routine healing: Secondary | ICD-10-CM | POA: Diagnosis not present

## 2017-11-02 DIAGNOSIS — Z9889 Other specified postprocedural states: Secondary | ICD-10-CM | POA: Diagnosis not present

## 2017-11-02 DIAGNOSIS — M545 Low back pain: Secondary | ICD-10-CM | POA: Diagnosis not present

## 2017-11-05 ENCOUNTER — Other Ambulatory Visit (HOSPITAL_COMMUNITY): Payer: Self-pay | Admitting: Psychiatry

## 2017-11-05 DIAGNOSIS — Z9889 Other specified postprocedural states: Secondary | ICD-10-CM | POA: Diagnosis not present

## 2017-11-05 DIAGNOSIS — S32010D Wedge compression fracture of first lumbar vertebra, subsequent encounter for fracture with routine healing: Secondary | ICD-10-CM | POA: Diagnosis not present

## 2017-11-05 DIAGNOSIS — M545 Low back pain: Secondary | ICD-10-CM | POA: Diagnosis not present

## 2017-11-09 DIAGNOSIS — Z9889 Other specified postprocedural states: Secondary | ICD-10-CM | POA: Diagnosis not present

## 2017-11-09 DIAGNOSIS — S32010D Wedge compression fracture of first lumbar vertebra, subsequent encounter for fracture with routine healing: Secondary | ICD-10-CM | POA: Diagnosis not present

## 2017-11-09 DIAGNOSIS — M545 Low back pain: Secondary | ICD-10-CM | POA: Diagnosis not present

## 2017-11-10 DIAGNOSIS — M545 Low back pain: Secondary | ICD-10-CM | POA: Diagnosis not present

## 2017-11-10 DIAGNOSIS — Z9889 Other specified postprocedural states: Secondary | ICD-10-CM | POA: Diagnosis not present

## 2017-11-10 DIAGNOSIS — S32010D Wedge compression fracture of first lumbar vertebra, subsequent encounter for fracture with routine healing: Secondary | ICD-10-CM | POA: Diagnosis not present

## 2017-11-15 DIAGNOSIS — F419 Anxiety disorder, unspecified: Secondary | ICD-10-CM | POA: Diagnosis not present

## 2017-11-16 DIAGNOSIS — M129 Arthropathy, unspecified: Secondary | ICD-10-CM | POA: Diagnosis not present

## 2017-11-16 DIAGNOSIS — G4733 Obstructive sleep apnea (adult) (pediatric): Secondary | ICD-10-CM | POA: Diagnosis not present

## 2017-12-21 DIAGNOSIS — 419620001 Death: Secondary | SNOMED CT | POA: Diagnosis not present

## 2017-12-21 DEATH — deceased

## 2018-12-21 DEATH — deceased

## 2019-04-15 IMAGING — CT CT L SPINE W/O CM
3 of 4 series · 14 of 33 positions shown, 17 images · non-contrast
Comparison: 05/19/2016 CT abdomen and pelvis.

CLINICAL DATA: Fall with lower back pain.

EXAM:
CT LUMBAR SPINE WITHOUT CONTRAST
TECHNIQUE: Multidetector CT imaging of the lumbar spine was performed without
intravenous contrast administration. Multiplanar CT image
reconstructions were also generated.

[Series 2: l-spine · axial · 0.31mm/px · z∈[-335,-157]mm · 6 of 117 slices shown, 8 images]
[im 15/117  soft-tissue]
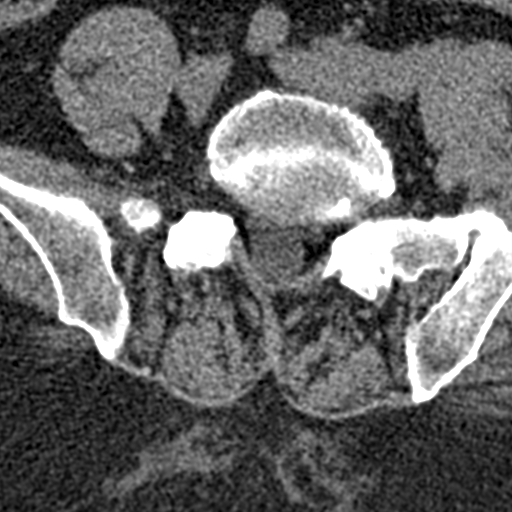
[im 15/117  bone]
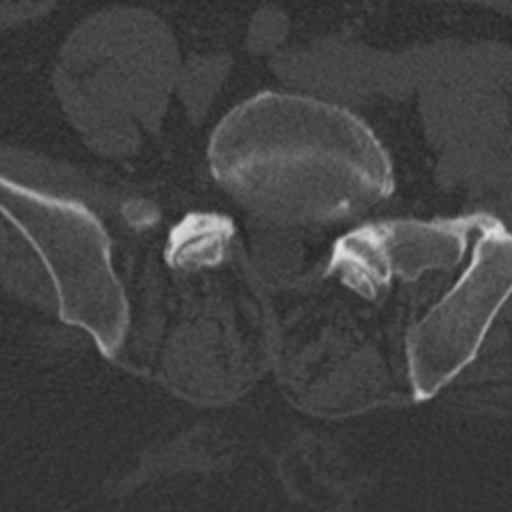
[im 30/117  bone]
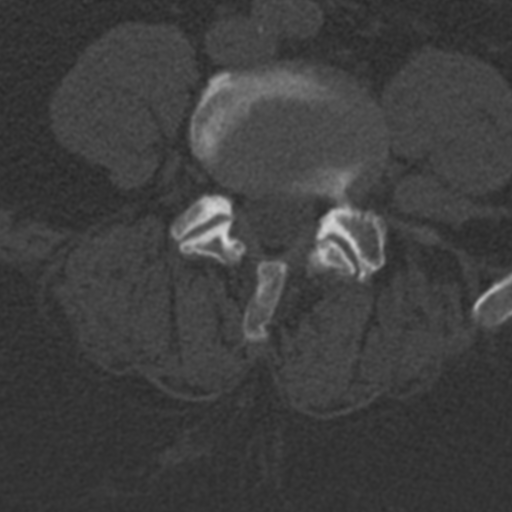
[im 44/117  bone]
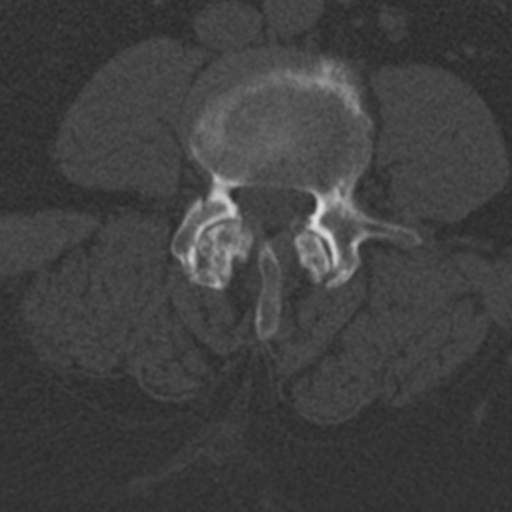
[im 73/117  bone]
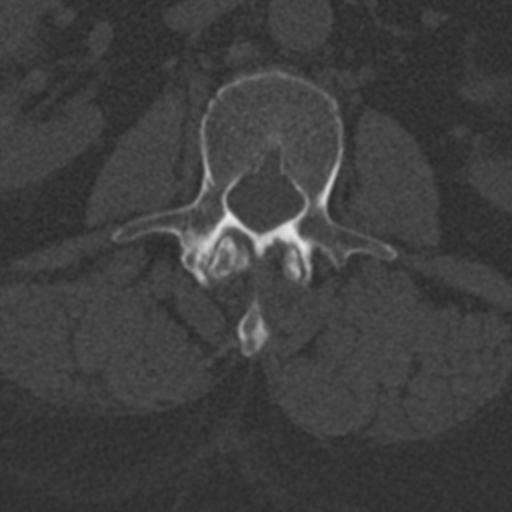
[im 88/117  soft-tissue]
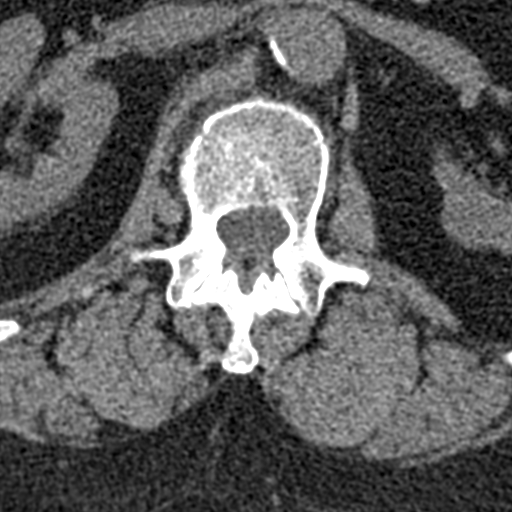
[im 88/117  bone]
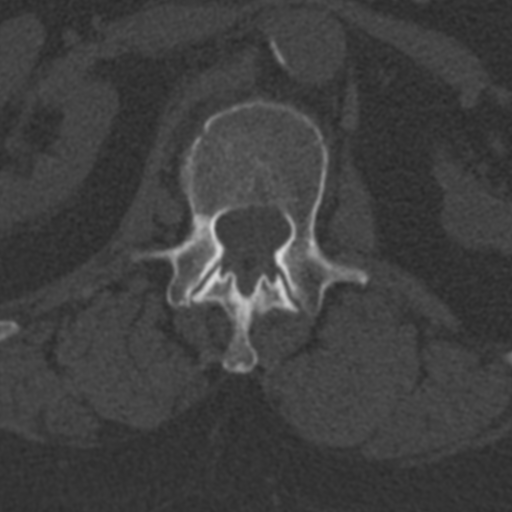
[im 102/117  bone]
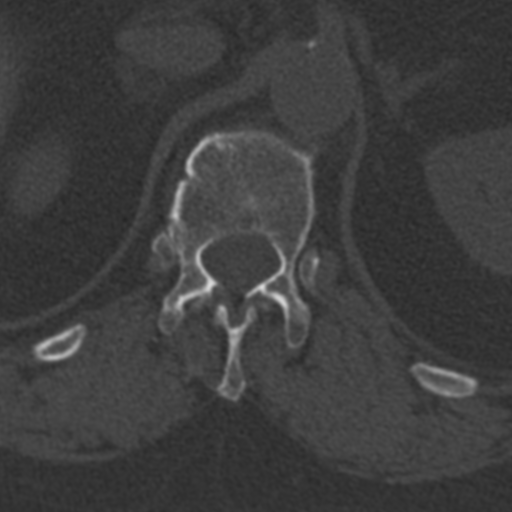

[Series 4: coronal · coronal · 0.36mm/px · 3 of 61 slices shown]
[im 13/61  bone]
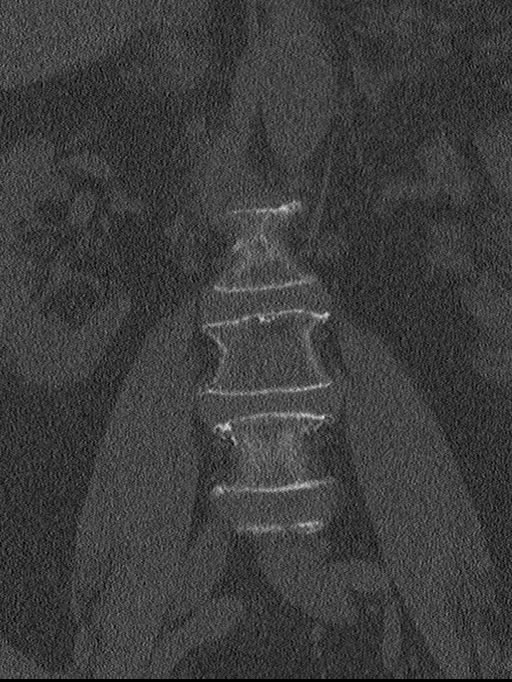
[im 25/61  bone]
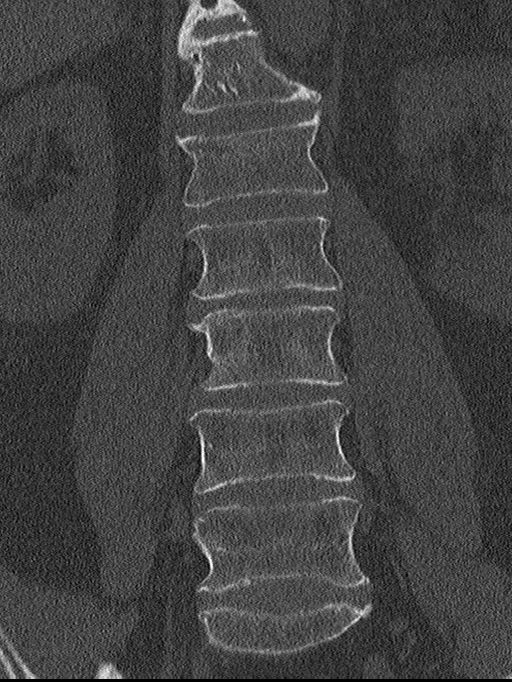
[im 37/61  bone]
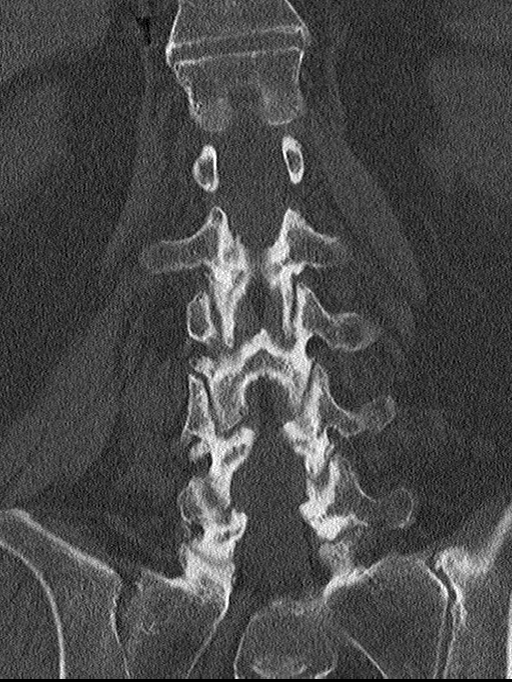

[Series 7: sagittal st · sagittal · 0.40mm/px · 5 of 61 slices shown, 6 images]
[im 21/61  bone]
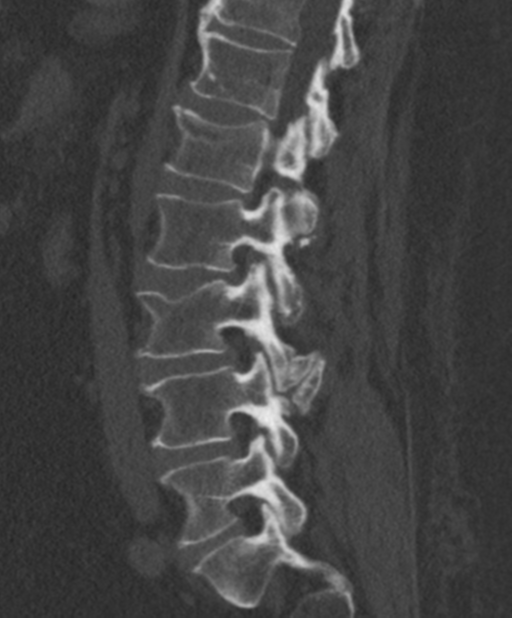
[im 26/61  bone]
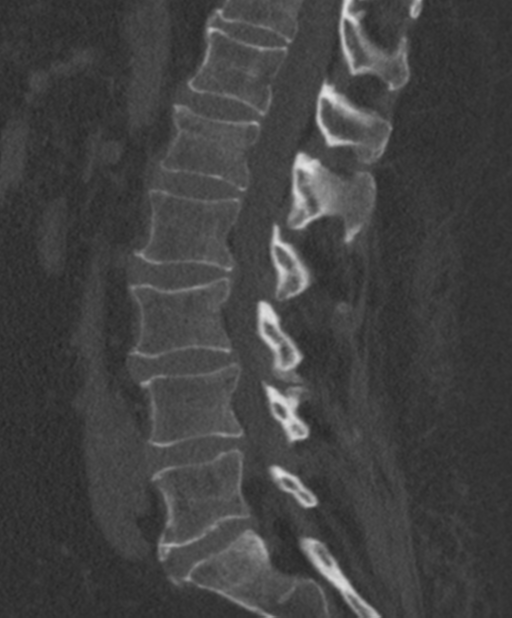
[im 31/61  soft-tissue]
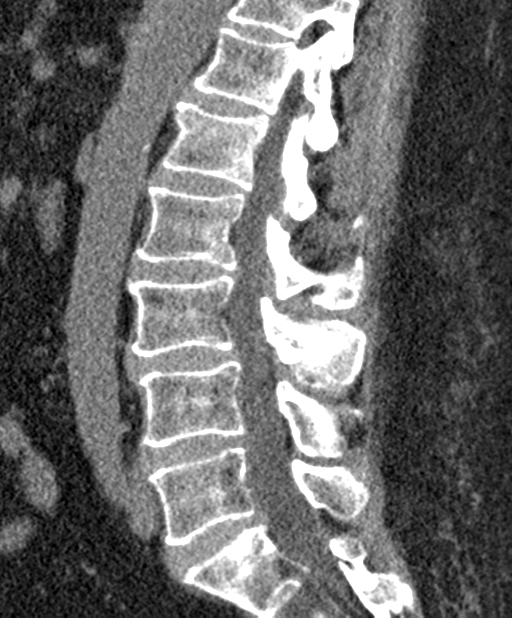
[im 31/61  bone]
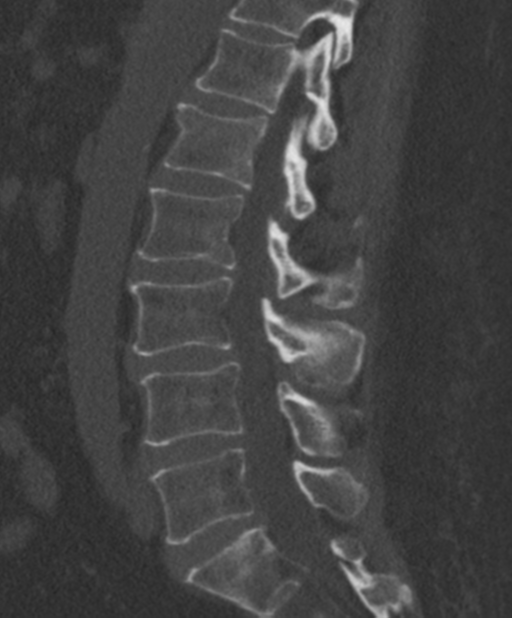
[im 36/61  bone]
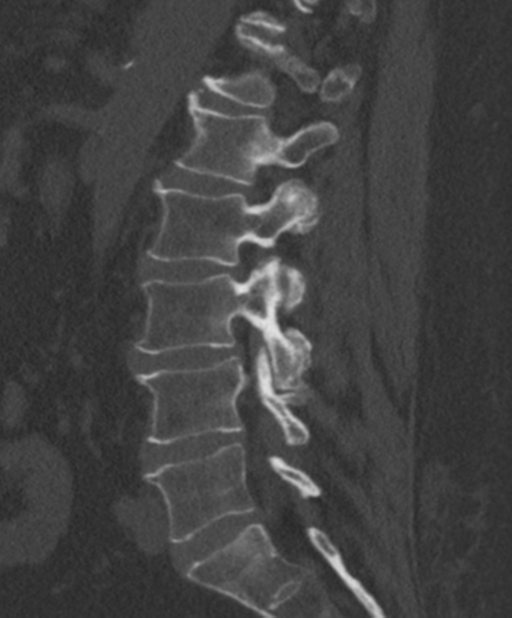
[im 41/61  bone]
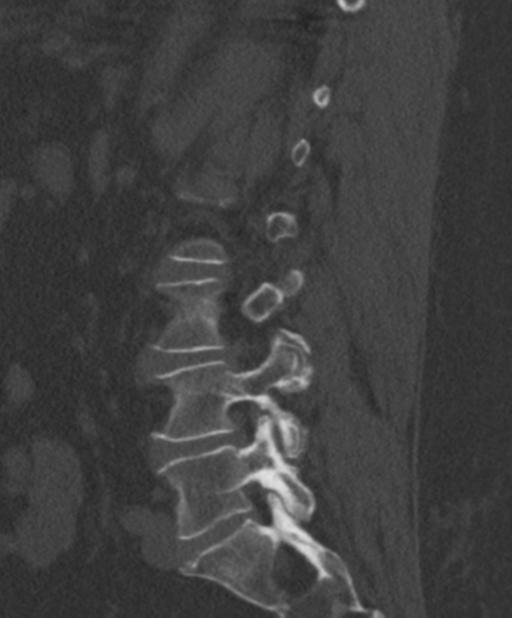

[14 of 33 positions shown; findings below may reference images not displayed]

FINDINGS: Segmentation: 5 lumbar type vertebrae.

Alignment: Stable mild straightening of lower lumbar lordosis and
minimal grade 1 C3-4 anterolisthesis.

Vertebrae: Acute L1 superior endplate fracture greater on the right
with minimal loss of vertebral body height and no retropulsion
(series 4, image 22 and series 5, image 27). No other fracture
identified.

Paraspinal and other soft tissues: Mild prevertebral edema at the L1
level. Mild calcific atherosclerosis of abdominal aorta.

Disc levels: Stable mild discogenic degenerative changes and
advanced facet arthropathy of the lumbar spine. No high-grade
foraminal or canal stenosis identified.
IMPRESSION: Acute L1 superior endplate fracture, greater on the right, with
minimal loss of vertebral body height. No bony retropulsion. No
malalignment.

By: Taurean Sack M.D.

## 2019-04-26 IMAGING — RF DG LUMBAR SPINE 2-3V
1 series · 1 of 1 positions shown · non-contrast
Comparison: CT 09/11/2017

CLINICAL DATA: L1 kyphoplasty

EXAM:
LUMBAR SPINE - 2-3 VIEW; DG C-ARM 61-120 MIN

[Series 1: run · 1 of 1 slices shown]
[im 1/1]
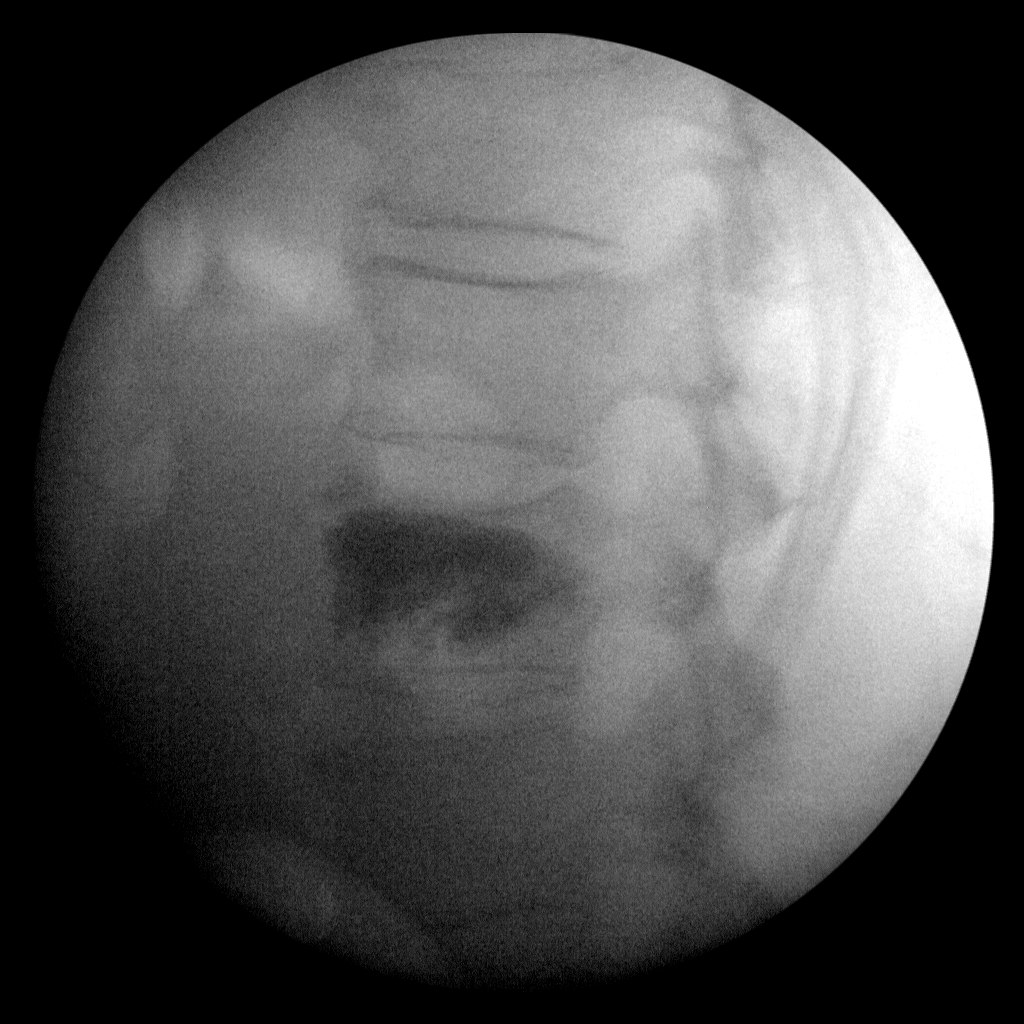

[1 of 1 positions shown; findings below may reference images not displayed]

FINDINGS: Total fluoroscopy time was 2 minutes 11 seconds. 2 low resolution
intraoperative spot views of the lumbar spine are submitted. The
images demonstrate augmentation of a lumbar vertebra.
IMPRESSION: Intraoperative fluoroscopic assistance provided during kyphoplasty
